# Patient Record
Sex: Female | Born: 1945 | Race: Black or African American | Hispanic: No | Marital: Married | Smoking: Never smoker
Health system: Southern US, Community
[De-identification: ages and names within clinical notes are randomized; demographics above are authoritative.]

## PROBLEM LIST (undated history)

## (undated) DIAGNOSIS — E079 Disorder of thyroid, unspecified: Secondary | ICD-10-CM

## (undated) DIAGNOSIS — E785 Hyperlipidemia, unspecified: Secondary | ICD-10-CM

## (undated) DIAGNOSIS — R739 Hyperglycemia, unspecified: Secondary | ICD-10-CM

## (undated) DIAGNOSIS — I10 Essential (primary) hypertension: Secondary | ICD-10-CM

## (undated) HISTORY — DX: Essential (primary) hypertension: I10

## (undated) HISTORY — DX: Hyperlipidemia, unspecified: E78.5

## (undated) HISTORY — PX: BREAST BIOPSY: SHX20

## (undated) HISTORY — PX: BIOPSY BREAST: PRO8

## (undated) HISTORY — DX: Hyperglycemia, unspecified: R73.9

## (undated) HISTORY — PX: ROTATOR CUFF REPAIR: SHX139

## (undated) HISTORY — PX: ABDOMINAL HYSTERECTOMY: SHX81

## (undated) HISTORY — DX: Disorder of thyroid, unspecified: E07.9

## (undated) HISTORY — PX: BACK SURGERY: SHX140

---

## 1971-06-22 HISTORY — PX: BREAST EXCISIONAL BIOPSY: SUR124

## 2000-03-30 ENCOUNTER — Encounter: Payer: Self-pay | Admitting: Orthopedic Surgery

## 2000-03-30 ENCOUNTER — Encounter: Admission: RE | Admit: 2000-03-30 | Discharge: 2000-03-30 | Payer: Self-pay | Admitting: Orthopedic Surgery

## 2000-04-05 ENCOUNTER — Encounter: Admission: RE | Admit: 2000-04-05 | Discharge: 2000-04-05 | Payer: Self-pay | Admitting: Orthopedic Surgery

## 2006-04-11 ENCOUNTER — Ambulatory Visit: Payer: Self-pay | Admitting: Internal Medicine

## 2006-04-11 LAB — CONVERTED CEMR LAB
ALT: 17 units/L (ref 0–40)
AST: 19 units/L (ref 0–37)
Albumin: 3.8 g/dL (ref 3.5–5.2)
Alkaline Phosphatase: 74 units/L (ref 39–117)
BUN: 9 mg/dL (ref 6–23)
CO2: 30 meq/L (ref 19–32)
Calcium: 9.2 mg/dL (ref 8.4–10.5)
Chloride: 104 meq/L (ref 96–112)
Chol/HDL Ratio, serum: 3.9
Cholesterol: 196 mg/dL (ref 0–200)
Creatinine, Ser: 0.8 mg/dL (ref 0.4–1.2)
GFR calc non Af Amer: 78 mL/min
Glomerular Filtration Rate, Af Am: 94 mL/min/{1.73_m2}
Glucose, Bld: 92 mg/dL (ref 70–99)
HCT: 39.1 % (ref 36.0–46.0)
HDL: 50.2 mg/dL (ref >39.0)
Hemoglobin: 12.7 g/dL (ref 12.0–15.0)
LDL Cholesterol: 130 mg/dL — ABNORMAL HIGH (ref 0–99)
MCHC: 32.6 g/dL (ref 30.0–36.0)
MCV: 87.6 fL (ref 78.0–100.0)
Platelets: 235 10*3/uL (ref 150–400)
Potassium: 4.1 meq/L (ref 3.5–5.1)
RBC: 4.46 M/uL (ref 3.87–5.11)
RDW: 12.9 % (ref 11.5–14.6)
Sed Rate: 18 mm/hr (ref 0–25)
Sodium: 141 meq/L (ref 135–145)
TSH: 2.19 microintl units/mL (ref 0.35–5.50)
Total Bilirubin: 0.9 mg/dL (ref 0.3–1.2)
Total Protein: 7.7 g/dL (ref 6.0–8.3)
Triglyceride fasting, serum: 77 mg/dL (ref 0–149)
VLDL: 15 mg/dL (ref 0–40)
WBC: 3.9 10*3/uL — ABNORMAL LOW (ref 4.5–10.5)

## 2006-04-12 ENCOUNTER — Encounter: Payer: Self-pay | Admitting: Internal Medicine

## 2006-07-14 ENCOUNTER — Encounter: Payer: Self-pay | Admitting: Internal Medicine

## 2006-11-23 ENCOUNTER — Encounter: Payer: Self-pay | Admitting: Internal Medicine

## 2007-03-10 ENCOUNTER — Ambulatory Visit: Payer: Self-pay | Admitting: Internal Medicine

## 2007-03-10 DIAGNOSIS — Z8679 Personal history of other diseases of the circulatory system: Secondary | ICD-10-CM | POA: Insufficient documentation

## 2007-03-10 DIAGNOSIS — R109 Unspecified abdominal pain: Secondary | ICD-10-CM | POA: Insufficient documentation

## 2007-03-10 DIAGNOSIS — Z8719 Personal history of other diseases of the digestive system: Secondary | ICD-10-CM | POA: Insufficient documentation

## 2007-03-10 DIAGNOSIS — R209 Unspecified disturbances of skin sensation: Secondary | ICD-10-CM

## 2007-03-10 LAB — CONVERTED CEMR LAB
Bilirubin Urine: NEGATIVE
Blood in Urine, dipstick: NEGATIVE
Glucose, Urine, Semiquant: NEGATIVE
Ketones, urine, test strip: NEGATIVE
Nitrite: NEGATIVE
Protein, U semiquant: NEGATIVE
Specific Gravity, Urine: 1.01
Urobilinogen, UA: NEGATIVE
WBC Urine, dipstick: NEGATIVE
pH: 6

## 2007-03-15 ENCOUNTER — Encounter: Payer: Self-pay | Admitting: Internal Medicine

## 2007-03-15 LAB — CONVERTED CEMR LAB
BUN: 12 mg/dL (ref 6–23)
Basophils Absolute: 0 10*3/uL (ref 0.0–0.1)
Basophils Relative: 1 % (ref 0–1)
CO2: 24 meq/L (ref 19–32)
Calcium: 11.1 mg/dL — ABNORMAL HIGH (ref 8.4–10.5)
Chloride: 104 meq/L (ref 96–112)
Creatinine, Ser: 0.82 mg/dL (ref 0.40–1.20)
Eosinophils Absolute: 0.1 10*3/uL (ref 0.0–0.7)
Eosinophils Relative: 2 % (ref 0–5)
Folate: >20 ng/mL
Glucose, Bld: 87 mg/dL (ref 70–99)
HCT: 36.6 % (ref 36.0–46.0)
Hemoglobin: 12.9 g/dL (ref 12.0–15.0)
Lymphocytes Relative: 42 % (ref 12–46)
Lymphs Abs: 1.7 10*3/uL (ref 0.7–3.3)
MCHC: 35.2 g/dL (ref 30.0–36.0)
MCV: 82.4 fL (ref 78.0–100.0)
Monocytes Absolute: 0.6 10*3/uL (ref 0.2–0.7)
Monocytes Relative: 14 % — ABNORMAL HIGH (ref 3–11)
Neutro Abs: 1.7 10*3/uL (ref 1.7–7.7)
Neutrophils Relative %: 41 % — ABNORMAL LOW (ref 43–77)
Platelets: 197 10*3/uL (ref 150–400)
Potassium: 4.4 meq/L (ref 3.5–5.3)
RBC: 4.44 M/uL (ref 3.87–5.11)
RDW: 14.3 % — ABNORMAL HIGH (ref 11.5–14.0)
Sodium: 141 meq/L (ref 135–145)
TSH: 2.286 microintl units/mL (ref 0.350–5.50)
Vitamin B-12: 396 pg/mL (ref 211–911)
WBC: 4 10*3/uL (ref 4.0–10.5)

## 2007-06-26 ENCOUNTER — Telehealth (INDEPENDENT_AMBULATORY_CARE_PROVIDER_SITE_OTHER): Payer: Self-pay | Admitting: *Deleted

## 2007-07-21 ENCOUNTER — Telehealth: Payer: Self-pay | Admitting: Internal Medicine

## 2008-09-23 ENCOUNTER — Encounter: Admission: RE | Admit: 2008-09-23 | Discharge: 2008-09-23 | Payer: Self-pay | Admitting: Sports Medicine

## 2008-10-10 ENCOUNTER — Ambulatory Visit (HOSPITAL_COMMUNITY): Admission: RE | Admit: 2008-10-10 | Discharge: 2008-10-11 | Payer: Self-pay | Admitting: Neurosurgery

## 2010-08-20 LAB — HM COLONOSCOPY

## 2010-09-30 LAB — CBC
HCT: 39.9 % (ref 36.0–46.0)
Hemoglobin: 13.9 g/dL (ref 12.0–15.0)
MCHC: 34.8 g/dL (ref 30.0–36.0)
RBC: 4.59 MIL/uL (ref 3.87–5.11)

## 2010-09-30 LAB — BASIC METABOLIC PANEL
CO2: 29 mEq/L (ref 19–32)
Calcium: 9 mg/dL (ref 8.4–10.5)
GFR calc Af Amer: >60 mL/min (ref >60)
Potassium: 4 mEq/L (ref 3.5–5.1)
Sodium: 141 mEq/L (ref 135–145)

## 2010-11-03 NOTE — Op Note (Signed)
Nicole Douglas, Nicole Douglas                 ACCOUNT NO.:  [id number]   MEDICAL RECORD NO.:  [id number]          PATIENT TYPE:  OIB   LOCATION:  3528                         FACILITY:  [facility]   PHYSICIAN:  Cristi Loron, M.D.DATE OF BIRTH:  [birth year]   DATE OF PROCEDURE:  10/10/2008  DATE OF DISCHARGE:                               OPERATIVE REPORT   BRIEF HISTORY:  The patient is a 65 year old back female who suffered  back and right leg pain consistent with right L4 and L5 radiculopathy.  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  I discussed various  treatment options with the patient and recommended to consider a right  L4 hemilaminectomy to decompress her right L4 as well as L5 nerve root.  The patient weighed the risks, benefits, and alternatives of the  operation and decided to proceed with the operation.   PREOPERATIVE DIAGNOSES:  Right L4-5 herniated nucleus pulposus, spinal  stenosis, right L4 and L5 radiculopathy, lumbago.   POSTOPERATIVE DIAGNOSES:  Right L4-5 herniated nucleus pulposus, spinal  stenosis, right L4 and L5 radiculopathy, lumbago.   PROCEDURE:  Right L4 hemilaminectomy with right L3 laminotomy and right  L4-5 diskectomy to decompress the right L4 and L5 nerve root using  microdissection.   SURGEON:  Cristi Loron, MD   ASSISTANT:  Stefani Dama, MD   ANESTHESIA:  General endotracheal.   ESTIMATED BLOOD LOSS:  50 mL.   SPECIMENS:  None.   DRAINS:  None.   COMPLICATIONS:  None.   DESCRIPTION OF PROCEDURE:  The patient was brought to the operating by  Anesthesia team.  General endotracheal anesthesia was induced.  The  patient was turned to prone position on Wilson frame.  The lumbosacral  region was then prepared with Betadine  scrub with Betadine solution.  Sterile drapes were applied.  I then injected the area to be incised  with Marcaine with epinephrine solution.  I used a scalpel to make a  linear midline incision over the L4-5 interspace.  I used electrocautery  to perform a right-sided subperiosteal dissection exposing the spinous  process and lamina of L4 and L5.  We obtained intraoperative radiograph  to confirm our location and then inserted McCullough retractor for  exposure.   We then used high-speed drill to perform a right L4 and L3 laminotomy.  I then used a Kerrison punch to complete the right L4 hemilaminectomy to  remove the ligament flavum at L4-5 and L3-4 and to widen the L3  laminotomy.  This gave [place] a long exposure of the thecal sac as well as  exposure of both the right L4 and L5 nerve root.   We then brought the operative microscope into field and under its  magnification and illumination, we completed the  microdissection/decompression.  We freed up the thecal sac and nerve  roots using microdissection.  Dr. Danielle Dess  then gently retracted the  thecal sac medially and we encountered a free fragment of disk  herniation which had migrated from the L4-5 intervertebral disk in a  cephalad direction and was compressing more or less in the axilla of the  right L4 nerve root.  We removed the disk herniation in multiple  fragments using the pituitary forceps.  We then performed foraminotomies  about the right L4 and L5 nerve root.  We then palpated along the  ventral surface of the thecal sac along the exit route of the right L4  and L5 nerve roots and noted that they were well decompressed.  Of note,  the patient did have a pseudo disk herniation at L4-5 because of  spondylolisthesis, but there was no true disk herniation per se at the  disk space.  We therefore did not enter into the intervertebral disk.  We obtained hemostasis using bipolar cautery.  We irrigated the wound  out with  bacitracin solution, removed the retractors, and then  reapproximated the patient's thoracolumbar fascia with interrupted #1  Vicryl suture, subcutaneous tissue with interrupted 2-0 Vicryl suture,  and the skin with Steri-Strips and Benzoin.  The wound was then coated  with bacitracin ointment.  A sterile dressing applied.  The drapes were  removed.  The patient was subsequently returned to supine position where  she was extubated by the Anesthesia team and transported to the Post  Anesthesia Care Unit in stable condition.  All sponge, instrument, and  needle counts were correct at the end of this case.      Cristi Loron, M.D.  Electronically Signed     JDJ/MEDQ  D:  10/10/2008  T:  10/11/2008  Job:  [id number]

## 2013-01-19 LAB — HM PAP SMEAR

## 2013-02-19 LAB — HM MAMMOGRAPHY

## 2013-09-13 ENCOUNTER — Ambulatory Visit: Payer: Self-pay | Admitting: Internal Medicine

## 2013-09-13 ENCOUNTER — Ambulatory Visit (HOSPITAL_BASED_OUTPATIENT_CLINIC_OR_DEPARTMENT_OTHER)
Admission: RE | Admit: 2013-09-13 | Discharge: 2013-09-13 | Disposition: A | Discharge status: Home / Self Care | Payer: Medicare Other | Source: Ambulatory Visit | Attending: Nurse Practitioner | Admitting: Nurse Practitioner

## 2013-09-13 ENCOUNTER — Telehealth: Payer: Self-pay | Admitting: Nurse Practitioner

## 2013-09-13 ENCOUNTER — Ambulatory Visit (INDEPENDENT_AMBULATORY_CARE_PROVIDER_SITE_OTHER): Payer: Medicare Other | Admitting: Nurse Practitioner

## 2013-09-13 VITALS — BP 128/73 | HR 58 | Temp 98.0°F | Ht 66.0 in | Wt 179.0 lb

## 2013-09-13 DIAGNOSIS — M545 Low back pain, unspecified: Secondary | ICD-10-CM | POA: Insufficient documentation

## 2013-09-13 DIAGNOSIS — S39012A Strain of muscle, fascia and tendon of lower back, initial encounter: Secondary | ICD-10-CM

## 2013-09-13 DIAGNOSIS — M25559 Pain in unspecified hip: Secondary | ICD-10-CM | POA: Insufficient documentation

## 2013-09-13 DIAGNOSIS — S335XXA Sprain of ligaments of lumbar spine, initial encounter: Secondary | ICD-10-CM

## 2013-09-13 DIAGNOSIS — M47817 Spondylosis without myelopathy or radiculopathy, lumbosacral region: Secondary | ICD-10-CM | POA: Insufficient documentation

## 2013-09-13 MED ORDER — PREDNISONE 10 MG PO TABS: ORAL_TABLET | ORAL | Status: DC

## 2013-09-13 NOTE — Patient Instructions (Signed)
Use heat, aspercream on painful areas. Take prednisone. Get xrays. Do gentle stretches daily. Follow up in 1 week. If pain not improved, I will refer you to physical therapy or orthopedist.    Lumbosacral Strain Lumbosacral strain is a strain of any of the parts that make up your lumbosacral vertebrae. Your lumbosacral vertebrae are the bones that make up the lower third of your backbone. Your lumbosacral vertebrae are held together by muscles and tough, fibrous tissue (ligaments).  CAUSES  A sudden blow to your back can cause lumbosacral strain. Also, anything that causes an excessive stretch of the muscles in the low back can cause this strain. This is typically seen when people exert themselves strenuously, fall, lift heavy objects, bend, or crouch repeatedly. RISK FACTORS  Physically demanding work.  Participation in pushing or pulling sports or sports that require sudden twist of the back (tennis, golf, baseball).  Weight lifting.  Excessive lower back curvature.  Forward-tilted pelvis.  Weak back or abdominal muscles or both.  Tight hamstrings. SIGNS AND SYMPTOMS  Lumbosacral strain may cause pain in the area of your injury or pain that moves (radiates) down your leg.  DIAGNOSIS Your health care provider can often diagnose lumbosacral strain through a physical exam. In some cases, you may need tests such as X-ray exams.  TREATMENT  Treatment for your lower back injury depends on many factors that your clinician will have to evaluate. However, most treatment will include the use of anti-inflammatory medicines. HOME CARE INSTRUCTIONS   Avoid hard physical activities (tennis, racquetball, waterskiing) if you are not in proper physical condition for it. This may aggravate or create problems.  If you have a back problem, avoid sports requiring sudden body movements. Swimming and walking are generally safer activities.  Maintain good posture.  Maintain a healthy weight.  For  acute conditions, you may put ice on the injured area.  Put ice in a plastic bag.  Place a towel between your skin and the bag.  Leave the ice on for 20 minutes, 2 3 times a day.  When the low back starts healing, stretching and strengthening exercises may be recommended. SEEK MEDICAL CARE IF:  Your back pain is getting worse.  You experience severe back pain not relieved with medicines. SEEK IMMEDIATE MEDICAL CARE IF:   You have numbness, tingling, weakness, or problems with the use of your arms or legs.  There is a change in bowel or bladder control.  You have increasing pain in any area of the body, including your belly (abdomen).  You notice shortness of breath, dizziness, or feel faint.  You feel sick to your stomach (nauseous), are throwing up (vomiting), or become sweaty.  You notice discoloration of your toes or legs, or your feet get very cold. MAKE SURE YOU:   Understand these instructions.  Will watch your condition.  Will get help right away if you are not doing well or get worse. Document Released: 03/17/2005 Document Revised: 03/28/2013 Document Reviewed: 01/24/2013 [facility] Patient Information 2014 [employer].

## 2013-09-13 NOTE — Progress Notes (Signed)
Pre-visit discussion using our clinic review tool. No additional management support is needed unless otherwise documented below in the visit note.  

## 2013-09-13 NOTE — Telephone Encounter (Signed)
Xrays show Moderate lumbar spondylosis with postoperative changes at L4-L5.  Progressive grade I anterolisthesis of L4 on L5 is probably facet mediated. Will recmd physical therapy.

## 2013-09-14 ENCOUNTER — Telehealth: Payer: Self-pay | Admitting: *Deleted

## 2013-09-14 DIAGNOSIS — M4310 Spondylolisthesis, site unspecified: Secondary | ICD-10-CM

## 2013-09-14 NOTE — Telephone Encounter (Signed)
Pt called back to let us know that she wants to try physical therapy for her hip. Pt wants to do water aerobics. Please call [facility] [phone]) to give a verbal order.

## 2013-09-14 NOTE — Telephone Encounter (Signed)
Patient notified of results. Pt stated that she wants to think about what she wants to do and discuss  It at her next appt.

## 2013-09-15 DIAGNOSIS — S39012A Strain of muscle, fascia and tendon of lower back, initial encounter: Secondary | ICD-10-CM | POA: Insufficient documentation

## 2013-09-15 NOTE — Progress Notes (Signed)
Subjective:    Lillia PaulsMarie K Biby is a 68 y.o. female who presents for evaluation of low back pain. The patient has had recurrent self limited episodes of low back pain in the past and previous spinal surgery - Dr Lovell SheehanJenkins, 2010-no record to review. L-S MRI 2010 showed right lateral recess and foraminal disc protrusion at L4-5 possibly impacting both the exiting right L4 and descending right L5 roots; Facet degenerative disease worst at L4-5 and L5-S1.   Symptoms have been present for 1 month and are rapidly worsening in the last week.  Onset was related to / precipitated by no known injury. The pain is located in the right lumbar area and radiates to the R SI joint & R iliac crest. The pain is described as burning and occurs all day. Pain is moderate to severe. Symptoms are exacerbated by sitting, twisting and walking/weight bearing. Symptoms are improved by heat. She has also tried acetaminophen and aspirin which provided no symptom relief. She has no other symptoms associated with the back pain. History indicative of potentially complicated back pain includes prior back surgery, known facet disease.  The following portions of the patient's history were reviewed and updated as appropriate: allergies, current medications, past medical history, past surgical history and problem list.  Review of Systems Pertinent items are noted in HPI.    Objective:   Normal reflexes, gait, strength and negative straight-leg raise. Inspection and palpation: inspection of back is normal, midline lumbar scar. tender to palpation at R SI joint & across R latissimus dorsi. Full forward flexion of back, but painful. FROM R hip. No tenderness over R trocanteric bursa.    Assessment & Plan:   Lumbar strain DD: radicular pathology See problem list for complete A&P

## 2013-09-15 NOTE — Assessment & Plan Note (Signed)
Burning pain R low back that radiates across R SI joint & iliac crest. Hx back surgery. DD: muscle strain, radicular pathology. XRY lumbar spine & pelvis Prednisone taper. Gentle stretches, moist heat. Offered PT referral, ortho referral. Pt declined. Wants to wait for xry results & see if prednisone helps.

## 2013-09-17 NOTE — Telephone Encounter (Signed)
Discussed patho of grade I anterolisthesis w/pt. & recommended thearpy. Pt is willing to go to PT, she also wants to do water aerobics. I advised she should see PT for their specific therapy, but I will also request water therapy. If no improvement, will refer to [facility] specialist of [place].

## 2013-09-18 NOTE — Telephone Encounter (Signed)
Spoke with pt, advised message from [place]. Pt understood. She spoke with her insurance and they stated she would have to stay in network. The providers they gave her were [facility] at [phone]  and Alpha Gulamy Wang [phone]

## 2013-09-19 ENCOUNTER — Telehealth: Payer: Self-pay

## 2013-09-19 NOTE — Telephone Encounter (Signed)
No answer. Unable to leave voice message.  Will call back.  NEW PATIENT

## 2013-09-19 NOTE — Telephone Encounter (Signed)
Medication and allergies:  Reviewed and updated  90 day supply/mail order: n/a [facility] pharmacy[facility]  [id number] [place], [place] - [address] [facility] [facility]   Immunizations due:  Td-DUE, PNA- DUE, Zoster- DUE   A/P: Personal, family history & past surgical hx:  Reviewed and updated PAP- 01/2013 per patient-normal CCS- 08/2010 or 2013 per patient MMG- 02/2013 per patient Flu- declined Tdap- "unsure"; probably greater than 10 years per patient PNA- Never had one per patient  Shingles- Never had one per patient  To Discuss with Provider: Nothing at this time.  Patient does not want a physical.

## 2013-09-20 ENCOUNTER — Encounter: Payer: Self-pay | Admitting: General Practice

## 2013-09-20 ENCOUNTER — Ambulatory Visit (INDEPENDENT_AMBULATORY_CARE_PROVIDER_SITE_OTHER): Payer: Medicare Other | Admitting: Family Medicine

## 2013-09-20 ENCOUNTER — Encounter: Payer: Self-pay | Admitting: Family Medicine

## 2013-09-20 VITALS — BP 130/80 | HR 60 | Temp 98.4°F | Resp 16 | Ht 66.0 in | Wt 177.5 lb

## 2013-09-20 DIAGNOSIS — M5137 Other intervertebral disc degeneration, lumbosacral region: Secondary | ICD-10-CM

## 2013-09-20 DIAGNOSIS — M5136 Other intervertebral disc degeneration, lumbar region: Secondary | ICD-10-CM | POA: Insufficient documentation

## 2013-09-20 DIAGNOSIS — E785 Hyperlipidemia, unspecified: Secondary | ICD-10-CM

## 2013-09-20 DIAGNOSIS — Z23 Encounter for immunization: Secondary | ICD-10-CM

## 2013-09-20 DIAGNOSIS — I10 Essential (primary) hypertension: Secondary | ICD-10-CM | POA: Insufficient documentation

## 2013-09-20 LAB — CBC WITH DIFFERENTIAL/PLATELET
Basophils Absolute: 0 10*3/uL (ref 0.0–0.1)
Basophils Relative: 0.3 % (ref 0.0–3.0)
EOS ABS: 0.1 10*3/uL (ref 0.0–0.7)
EOS PCT: 1.6 % (ref 0.0–5.0)
HEMATOCRIT: 39.2 % (ref 36.0–46.0)
Hemoglobin: 13.1 g/dL (ref 12.0–15.0)
LYMPHS ABS: 3 10*3/uL (ref 0.7–4.0)
Lymphocytes Relative: 36.8 % (ref 12.0–46.0)
MCHC: 33.4 g/dL (ref 30.0–36.0)
MCV: 87.4 fl (ref 78.0–100.0)
MONO ABS: 0.6 10*3/uL (ref 0.1–1.0)
Monocytes Relative: 7.8 % (ref 3.0–12.0)
NEUTROS PCT: 53.5 % (ref 43.0–77.0)
Neutro Abs: 4.4 10*3/uL (ref 1.4–7.7)
PLATELETS: 184 10*3/uL (ref 150.0–400.0)
RBC: 4.48 Mil/uL (ref 3.87–5.11)
RDW: 15.1 % — AB (ref 11.5–14.6)
WBC: 8.3 10*3/uL (ref 4.5–10.5)

## 2013-09-20 LAB — BASIC METABOLIC PANEL
BUN: 17 mg/dL (ref 6–23)
CHLORIDE: 101 meq/L (ref 96–112)
CO2: 28 mEq/L (ref 19–32)
Calcium: 8.9 mg/dL (ref 8.4–10.5)
Creatinine, Ser: 0.8 mg/dL (ref 0.4–1.2)
GFR: 86.41 mL/min (ref >60.00)
Glucose, Bld: 101 mg/dL — ABNORMAL HIGH (ref 70–99)
POTASSIUM: 3.7 meq/L (ref 3.5–5.1)
SODIUM: 139 meq/L (ref 135–145)

## 2013-09-20 LAB — HEPATIC FUNCTION PANEL
ALK PHOS: 86 U/L (ref 39–117)
ALT: 23 U/L (ref 0–35)
AST: 17 U/L (ref 0–37)
Albumin: 4 g/dL (ref 3.5–5.2)
BILIRUBIN DIRECT: 0.1 mg/dL (ref 0.0–0.3)
BILIRUBIN TOTAL: 1.4 mg/dL — AB (ref 0.3–1.2)
Total Protein: 7.3 g/dL (ref 6.0–8.3)

## 2013-09-20 LAB — LIPID PANEL
CHOLESTEROL: 150 mg/dL (ref 0–200)
HDL: 68.2 mg/dL (ref >39.00)
LDL CALC: 67 mg/dL (ref 0–99)
Total CHOL/HDL Ratio: 2
Triglycerides: 74 mg/dL (ref 0.0–149.0)
VLDL: 14.8 mg/dL (ref 0.0–40.0)

## 2013-09-20 NOTE — Assessment & Plan Note (Signed)
New to provider, chronic for pt.  Currently on pred taper.  Scheduled to start PT.  Will continue to monitor.

## 2013-09-20 NOTE — Assessment & Plan Note (Signed)
New to provider, ongoing for pt.  Well controlled.  Asymptomatic.  Check labs.  Anticipatory guidance provided.

## 2013-09-20 NOTE — Patient Instructions (Signed)
Schedule your complete physical in 6 months We'll notify you of your lab results and make any changes if needed Keep up the good work!  You look great! Call with any questions or concerns Welcome Back!  We're glad to have you!

## 2013-09-20 NOTE — Progress Notes (Signed)
   Subjective:    Patient ID: Nicole PaulsMarie K Douglas, female    DOB: 1946-02-28, 68 y.o.   MRN: [id number]  HPI New to establish.  [facility]  HTN- chronic problem, 1st dx'd 8-10 yrs ago.  On Losartan and Metoprolol.  No CP, SOB, HAs, visual changes, edema.  Hyperlipidemia- chronic problem, on Lipitor.  No abd pain, N/V, myalgias.  Lumbar degenerative changes- saw Maximino SarinLayne Weaver last week for this, currently on pred taper.  Has PT identified but schedule has not yet worked to start treatment.  Hx of back surgery w/ Dr Lovell SheehanJenkins (2010)  Review of Systems For ROS see HPI     Objective:   Physical Exam  Vitals reviewed. Constitutional: She is oriented to person, place, and time. She appears well-developed and well-nourished. No distress.  HENT:  Head: Normocephalic and atraumatic.  Eyes: Conjunctivae and EOM are normal. Pupils are equal, round, and reactive to light.  Neck: Normal range of motion. Neck supple. No thyromegaly present.  Cardiovascular: Normal rate, regular rhythm, normal heart sounds and intact distal pulses.   No murmur heard. Pulmonary/Chest: Effort normal and breath sounds normal. No respiratory distress.  Abdominal: Soft. She exhibits no distension. There is no tenderness.  Musculoskeletal: She exhibits no edema.  Lymphadenopathy:    She has no cervical adenopathy.  Neurological: She is alert and oriented to person, place, and time.  Skin: Skin is warm and dry.  Psychiatric: She has a normal mood and affect. Her behavior is normal.          Assessment & Plan:

## 2013-09-20 NOTE — Progress Notes (Signed)
Pre visit review using our clinic review tool, if applicable. No additional management support is needed unless otherwise documented below in the visit note. 

## 2013-09-20 NOTE — Assessment & Plan Note (Signed)
New to provider, ongoing for pt.  Tolerating med w/o difficulty.  Check labs.  Adjust meds prn

## 2013-09-20 NOTE — Addendum Note (Signed)
Addended by: Jackson LatinoYLER, JESSICA L on: 09/20/2013 10:49 AM   Modules accepted: Orders

## 2013-09-24 ENCOUNTER — Telehealth: Payer: Self-pay | Admitting: Family Medicine

## 2013-09-24 NOTE — Telephone Encounter (Signed)
Relevant patient education assigned to patient using Emmi. ° °

## 2013-10-17 ENCOUNTER — Telehealth: Payer: Self-pay

## 2013-10-17 MED ORDER — MECLIZINE HCL 25 MG PO TABS: 25.0000 mg | ORAL_TABLET | Freq: Three times a day (TID) | ORAL | Status: DC | PRN

## 2013-10-17 NOTE — Telephone Encounter (Signed)
C/o dizziness x 3 days.  Starts in the morning and persists throughout the day.  Described as a "nervous" dizziness.  Feels nervous on the inside.  Monday was the worst; patient stated that she was unable to lift her head from her pillow. Hx. Vertigo.  Also having elevated blood pressures and intermittent "lighting" sharp pain in chest, that lasts for second and then disappears.  [facility]  Vitals Upon arrival: 168/88 sitting, HR: 58, 98% on RA Laying down:  190/94 (Became dizzy, room spinning) Sitting:  188/110 (became emotional and started crying; dizziness resolved) Standing:  192/108    Dr. Beverely Lowabori was made aware.  Ordered:  Meclizine 25 mg PO TID PRN, Increase Losartan to 100 mg daily, and schedule office visit.    Orders were reviewed with patient and her husband.  Office visit scheduled for Friday, Oct 19, 2013 at 0945.   Patient and husband was advised that if she starts to experience chest pain, pounding headache, or worsening symptoms to go immediately to the ER or to call EMS.  They stated understanding and agreed with plan.

## 2013-10-19 ENCOUNTER — Encounter: Payer: Self-pay | Admitting: Family Medicine

## 2013-10-19 ENCOUNTER — Ambulatory Visit: Payer: Medicare Other

## 2013-10-19 ENCOUNTER — Ambulatory Visit (INDEPENDENT_AMBULATORY_CARE_PROVIDER_SITE_OTHER): Payer: Medicare Other | Admitting: Family Medicine

## 2013-10-19 VITALS — BP 160/68 | HR 78 | Temp 98.2°F | Resp 16 | Wt 179.1 lb

## 2013-10-19 DIAGNOSIS — R7309 Other abnormal glucose: Secondary | ICD-10-CM

## 2013-10-19 DIAGNOSIS — H8110 Benign paroxysmal vertigo, unspecified ear: Secondary | ICD-10-CM

## 2013-10-19 DIAGNOSIS — I10 Essential (primary) hypertension: Secondary | ICD-10-CM

## 2013-10-19 DIAGNOSIS — R42 Dizziness and giddiness: Secondary | ICD-10-CM | POA: Insufficient documentation

## 2013-10-19 LAB — BASIC METABOLIC PANEL
BUN: 12 mg/dL (ref 6–23)
CALCIUM: 9.2 mg/dL (ref 8.4–10.5)
CO2: 26 mEq/L (ref 19–32)
Chloride: 107 mEq/L (ref 96–112)
Creatinine, Ser: 0.8 mg/dL (ref 0.4–1.2)
GFR: 88.83 mL/min (ref >60.00)
GLUCOSE: 130 mg/dL — AB (ref 70–99)
POTASSIUM: 3.7 meq/L (ref 3.5–5.1)
SODIUM: 141 meq/L (ref 135–145)

## 2013-10-19 LAB — CBC WITH DIFFERENTIAL/PLATELET
BASOS ABS: 0 10*3/uL (ref 0.0–0.1)
BASOS PCT: 0.5 % (ref 0.0–3.0)
EOS ABS: 0.1 10*3/uL (ref 0.0–0.7)
Eosinophils Relative: 2 % (ref 0.0–5.0)
HCT: 38.2 % (ref 36.0–46.0)
HEMOGLOBIN: 12.9 g/dL (ref 12.0–15.0)
LYMPHS PCT: 29.8 % (ref 12.0–46.0)
Lymphs Abs: 1.5 10*3/uL (ref 0.7–4.0)
MCHC: 33.8 g/dL (ref 30.0–36.0)
MCV: 87 fl (ref 78.0–100.0)
MONOS PCT: 9.2 % (ref 3.0–12.0)
Monocytes Absolute: 0.5 10*3/uL (ref 0.1–1.0)
NEUTROS PCT: 58.5 % (ref 43.0–77.0)
Neutro Abs: 2.9 10*3/uL (ref 1.4–7.7)
Platelets: 218 10*3/uL (ref 150.0–400.0)
RBC: 4.39 Mil/uL (ref 3.87–5.11)
RDW: 14.6 % (ref 11.5–14.6)
WBC: 5 10*3/uL (ref 4.5–10.5)

## 2013-10-19 LAB — HEMOGLOBIN A1C: Hgb A1c MFr Bld: 5.6 % (ref 4.6–6.5)

## 2013-10-19 LAB — TSH: TSH: 1.65 u[IU]/mL (ref 0.35–5.50)

## 2013-10-19 MED ORDER — FLUTICASONE PROPIONATE 50 MCG/ACT NA SUSP: 2.0000 | Freq: Every day | NASAL | Status: DC

## 2013-10-19 MED ORDER — ONDANSETRON HCL 4 MG PO TABS: 4.0000 mg | ORAL_TABLET | Freq: Three times a day (TID) | ORAL | Status: DC | PRN

## 2013-10-19 NOTE — Patient Instructions (Signed)
Follow up in 1 month to recheck BP Continue the higher dose of Losartan (100mg - 2 tabs daily) Use the Meclizine as needed for dizziness- you can take 2 tabs at a time if dizziness is severe Take the Zofran as needed for nausea Drink plenty of fluids Change positions slowly Use the Flonase- 2 sprays each nostril- to decrease nasal congestion Call with any questions or concerns- particularly if dizziness persists, worsens, or you develop other symptoms Hang in there!

## 2013-10-19 NOTE — Progress Notes (Signed)
   Subjective:    Patient ID: Nicole Douglas, female    DOB: 09-May-1946, 68 y.o.   MRN: [id number]  HPI Pt walked in on 4/29 for elevated BP and dizziness.  Pt has hx of vertigo and HTN.  Was given meclizine and increased Losartan to 100mg  daily. Pt reports this is 2nd episode in 2 months.  On Monday, 'i just couldn't get my head off the pillow'- every time she tried to sit up, the room would spin.  Has been having vertigo intermittently since.  Worse w/ turning head, walking, rolling over- position changes.  No ear pain.  No nasal congestion or sinus pressure.  No focal weakness.  No confusion.  No fevers.  Meclizine is providing some relief.   Review of Systems For ROS see HPI     Objective:   Physical Exam  Vitals reviewed. Constitutional: She is oriented to person, place, and time. She appears well-developed and well-nourished. No distress.  HENT:  Head: Normocephalic and atraumatic.  Mouth/Throat: Uvula is midline and mucous membranes are normal.  TMs WNL No TTP over sinuses Minimal nasal congestion  Eyes: Conjunctivae and EOM are normal. Pupils are equal, round, and reactive to light.  2-3 beats of horizontal nystagmus  Neck: Normal range of motion. Neck supple.  Cardiovascular: Normal rate, regular rhythm and intact distal pulses.   Murmur (II/VI SEM) heard. Pulmonary/Chest: Effort normal and breath sounds normal. No respiratory distress. She has no wheezes. She has no rales.  Musculoskeletal: She exhibits no edema.  Lymphadenopathy:    She has no cervical adenopathy.  Neurological: She is alert and oriented to person, place, and time. She has normal reflexes. No cranial nerve deficit.  Skin: Skin is warm and dry.  Psychiatric: She has a normal mood and affect. Her behavior is normal. Judgment and thought content normal.          Assessment & Plan:

## 2013-10-19 NOTE — Progress Notes (Signed)
Pre visit review using our clinic review tool, if applicable. No additional management support is needed unless otherwise documented below in the visit note. 

## 2013-10-19 NOTE — Assessment & Plan Note (Signed)
Deteriorated.  Pt to continue Losartan at 100mg  daily.  Will get labs to assess BMP.  Will follow closely.

## 2013-10-19 NOTE — Assessment & Plan Note (Addendum)
New to provider.  Improving w/ meclizine.  Suspect that pt's dizziness, discomfort, and anxiety is elevating her BP.  No red flags on hx or PE.  Check labs to r/o underlying causes. Continue meclizine.  Reviewed supportive care and red flags that should prompt return.  Pt expressed understanding and is in agreement w/ plan.

## 2013-10-25 ENCOUNTER — Telehealth: Payer: Self-pay | Admitting: Family Medicine

## 2013-10-25 NOTE — Telephone Encounter (Addendum)
C/o:  Right hand swollen and hurts when she uses it.  Rate pain 6/10.  Describes pain as achy, sharp pain with movement.  Having a hard time gripping items with hand.  More swelling noted in thumb and palm.    Onset:  Swelling started yesterday and has worsen today.  However, right hand has been sore for a while "approximately month," but only recently started to swell.  Denies recent injury and hx of arthritis.    Advice given:  Appointment scheduled for 10/26/13 at 11:30am.  She was also advised to soak hand in warm water 10-15 minutes periodically and to take Aleve as directed on bottle.  Report to Urgent Care or ER if pain becomes severe or new symptoms appear.

## 2013-10-25 NOTE — Telephone Encounter (Signed)
Caller name: Hilda LiasMarie Relation to pt: self Call back [phone]:   Reason for call:   Pt states that right hand is swollen and wants to know if this is a reaction to the current medications she is taking.  Please advise.

## 2013-10-26 ENCOUNTER — Ambulatory Visit (INDEPENDENT_AMBULATORY_CARE_PROVIDER_SITE_OTHER): Payer: Medicare Other | Admitting: Family Medicine

## 2013-10-26 ENCOUNTER — Ambulatory Visit (HOSPITAL_BASED_OUTPATIENT_CLINIC_OR_DEPARTMENT_OTHER)
Admission: RE | Admit: 2013-10-26 | Discharge: 2013-10-26 | Disposition: A | Discharge status: Home / Self Care | Payer: Medicare Other | Source: Ambulatory Visit | Attending: Family Medicine | Admitting: Family Medicine

## 2013-10-26 ENCOUNTER — Encounter: Payer: Self-pay | Admitting: Family Medicine

## 2013-10-26 VITALS — BP 140/80 | HR 67 | Temp 98.4°F | Resp 16 | Wt 180.1 lb

## 2013-10-26 DIAGNOSIS — M79641 Pain in right hand: Secondary | ICD-10-CM

## 2013-10-26 DIAGNOSIS — M79609 Pain in unspecified limb: Secondary | ICD-10-CM

## 2013-10-26 DIAGNOSIS — M25549 Pain in joints of unspecified hand: Secondary | ICD-10-CM | POA: Insufficient documentation

## 2013-10-26 DIAGNOSIS — R42 Dizziness and giddiness: Secondary | ICD-10-CM

## 2013-10-26 LAB — CBC WITH DIFFERENTIAL/PLATELET
BASOS PCT: 0.3 % (ref 0.0–3.0)
Basophils Absolute: 0 10*3/uL (ref 0.0–0.1)
EOS PCT: 1.9 % (ref 0.0–5.0)
Eosinophils Absolute: 0.1 10*3/uL (ref 0.0–0.7)
HCT: 36.3 % (ref 36.0–46.0)
HEMOGLOBIN: 12.3 g/dL (ref 12.0–15.0)
LYMPHS PCT: 30.4 % (ref 12.0–46.0)
Lymphs Abs: 1.3 10*3/uL (ref 0.7–4.0)
MCHC: 33.8 g/dL (ref 30.0–36.0)
MCV: 86.9 fl (ref 78.0–100.0)
Monocytes Absolute: 0.4 10*3/uL (ref 0.1–1.0)
Monocytes Relative: 9 % (ref 3.0–12.0)
NEUTROS ABS: 2.6 10*3/uL (ref 1.4–7.7)
NEUTROS PCT: 58.4 % (ref 43.0–77.0)
Platelets: 184 10*3/uL (ref 150.0–400.0)
RBC: 4.18 Mil/uL (ref 3.87–5.11)
RDW: 14.3 % (ref 11.5–15.5)
WBC: 4.4 10*3/uL (ref 4.0–10.5)

## 2013-10-26 LAB — URIC ACID: Uric Acid, Serum: 5 mg/dL (ref 2.4–7.0)

## 2013-10-26 MED ORDER — MELOXICAM 15 MG PO TABS: 15.0000 mg | ORAL_TABLET | Freq: Every day | ORAL | Status: DC

## 2013-10-26 NOTE — Patient Instructions (Signed)
Follow up as needed [facility] [address] and get your xrays Start the Meloxicam daily for swelling and inflammation We'll notify you of your lab results and make any changes if needed Wear the brace for support If no improvement in 10-14 days, please call and we'll send you to ortho We'll call you with your neuro appt for the dizziness Call with any questions or concerns Hang in there!! Happy Mother's Day!!!

## 2013-10-26 NOTE — Progress Notes (Signed)
Pre visit review using our clinic review tool, if applicable. No additional management support is needed unless otherwise documented below in the visit note. 

## 2013-10-26 NOTE — Assessment & Plan Note (Signed)
Ongoing issue for pt.  Some relief w/ Meclizine but still having sxs when lying down.  Continue Meclizine, refer to neuro.

## 2013-10-26 NOTE — Assessment & Plan Note (Signed)
New.  Pt w/ pain and swelling over R CMC and MTP joints.  No known injury.  Check labs to r/o infxn, gout.  Get Xrays to assess.  Pt placed in thumb spica.  Start scheduled NSAID.  If no improvement in 10-14 days will refer to hand specialist.  Pt expressed understanding and is in agreement w/ plan.

## 2013-10-26 NOTE — Progress Notes (Signed)
   Subjective:    Patient ID: Nicole Douglas, female    DOB: 07/28/1945, 68 y.o.   MRN: [id number]  HPI Hand pain- R hand.  Pain is concentrated around R 1st MCP and CMC joint.  + swelling.  Pain will radiate up into forearm.  Pain started on Wednesday.  No known injury.  Woke w/ pain and swelling.  Did epsom soaks w/ some improvement in swelling. R hand dominant.  Dizziness- continues to struggle when lying down.  Meclizine w/ some improvement but sxs persist.   Review of Systems For ROS see HPI     Objective:   Physical Exam  Vitals reviewed. Constitutional: She is oriented to person, place, and time. She appears well-developed and well-nourished. No distress.  HENT:  Head: Normocephalic and atraumatic.  Cardiovascular: Intact distal pulses.   Musculoskeletal: She exhibits edema (over R 1st CMC, MTP joints) and tenderness.  Neurological: She is alert and oriented to person, place, and time. She has normal reflexes. No cranial nerve deficit. Coordination normal.  R thumb strength WNL on manual muscle testing but pain w/ abduction in snuff box  Skin: Skin is warm and dry. No erythema.  Psychiatric: She has a normal mood and affect. Her behavior is normal. Thought content normal.          Assessment & Plan:

## 2013-10-29 ENCOUNTER — Telehealth: Payer: Self-pay | Admitting: Family Medicine

## 2013-10-29 NOTE — Telephone Encounter (Signed)
Neurology will call pt directly to schedule appt. I will document this info in the referral. Thanks.

## 2013-10-29 NOTE — Telephone Encounter (Signed)
Caller name:Elleana Zeis  Relation to BM:WUXLKGMpt:patient Call back number:[phone] Pharmacy:  Reason for call: patient called and stated that she will not be available on May 20 th or June 9 th -23 rd for her referral to  Neurology.

## 2013-10-31 ENCOUNTER — Encounter: Payer: Self-pay | Admitting: Neurology

## 2013-10-31 ENCOUNTER — Ambulatory Visit (INDEPENDENT_AMBULATORY_CARE_PROVIDER_SITE_OTHER): Payer: Medicare Other | Admitting: Neurology

## 2013-10-31 VITALS — BP 136/76 | HR 67 | Temp 97.5°F | Ht 66.0 in | Wt 179.0 lb

## 2013-10-31 DIAGNOSIS — R42 Dizziness and giddiness: Secondary | ICD-10-CM

## 2013-10-31 DIAGNOSIS — H9319 Tinnitus, unspecified ear: Secondary | ICD-10-CM

## 2013-10-31 NOTE — Progress Notes (Signed)
NEUROLOGY CONSULTATION NOTE  BRANDYE INTHAVONG MRN: [id number] DOB: May 24, 1946  Referring provider: Dr. Neena Rhymes Primary care provider: Dr. Neena Rhymes  Reason for consult:  dizziness  Dear Dr Beverely Low:  Thank you for your kind referral of Nicole Douglas for consultation of the above symptoms. Although her history is well known to you, please allow me to reiterate it for the purpose of our medical record. Records and images were personally reviewed where available.  HISTORY OF PRESENT ILLNESS: This is a pleasant 68 year old right-handed woman with a history of hypertension and hyperlipidemia, presenting for evaluation of dizziness.  She reports that a couple of years ago, she had an episode of severe spinning with associated nausea where she had to go to the ER. Symptoms resolved after a few days.  In the past 2 months, she has had 2 similar episodes of vertigo.  The most recent episode occurred at the end of April when she had episodes 8-10 times a day, lasting 45-60 minutes each.  Initially she would have the spinning sensation when walking or sitting up, bumping into things.  They then started to occur more when she was lying down and turning in the bed.  Lying still helps.  She took meclizine for 3 days, and has not taken it since last week, however continues to have intermittent mild episodes of "floating" that she reports is "pretty moderate but tolerable."  She denies any recent infections or head trauma.  She denies any associated headache, diplopia, dysarthria, dysphagia, focal numbness/tingling/weakness.  No nausea or vomiting with the past 2 more intense vertigo events.  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  She denies any clear hearing loss.  She had seen 2 ENT specialists 8 years ago for the tinnitus.  There is no family history of similar symptoms.  Laboratory Data: Lab Results  Component Value Date     WBC 4.4 10/26/2013   HGB 12.3 10/26/2013   HCT 36.3 10/26/2013   MCV 86.9 10/26/2013   PLT 184.0 10/26/2013     Chemistry      Component Value Date/Time   NA 141 10/19/2013 1017   K 3.7 10/19/2013 1017   CL 107 10/19/2013 1017   CO2 26 10/19/2013 1017   BUN 12 10/19/2013 1017   CREATININE 0.8 10/19/2013 1017      Component Value Date/Time   CALCIUM 9.2 10/19/2013 1017   ALKPHOS 86 09/20/2013 0933   AST 17 09/20/2013 0933   ALT 23 09/20/2013 0933   BILITOT 1.4* 09/20/2013 0933     Lab Results  Component Value Date   HGBA1C 5.6 10/19/2013   Lab Results  Component Value Date   TSH 1.65 10/19/2013    PAST MEDICAL HISTORY: Past Medical History  Diagnosis Date  . Hypertension   . Hyperlipemia     PAST SURGICAL HISTORY: Past Surgical History  Procedure Laterality Date  . Rotator cuff repair      bilateral  . Back surgery    . Abdominal hysterectomy    . Biopsy breast      left breast    MEDICATIONS: Current Outpatient Prescriptions on File Prior to Visit  Medication Sig Dispense Refill  . aspirin 81 MG tablet Take 81 mg by mouth daily.      Marland Kitchen atorvastatin (LIPITOR) 10 MG tablet Take 10 mg by mouth daily.      . Calcium Carbonate (  CALTRATE 600 PO) Take by mouth daily.      Marland Kitchen. ESTRADIOL PO Take by mouth daily.      . fluticasone (FLONASE) 50 MCG/ACT nasal spray Place 2 sprays into both nostrils daily.  16 g  6  . losartan (COZAAR) 50 MG tablet Take 100 mg by mouth daily. Take 2 tabs (100 mg total) by moth daily.      . meclizine (ANTIVERT) 25 MG tablet Take 1 tablet (25 mg total) by mouth 3 (three) times daily as needed for dizziness.  60 tablet  0  . meloxicam (MOBIC) 15 MG tablet Take 1 tablet (15 mg total) by mouth daily.  30 tablet  0  . metoprolol tartrate (LOPRESSOR) 25 MG tablet Take 25 mg by mouth 2 (two) times daily.        No current facility-administered medications on file prior to visit.    ALLERGIES: No Known Allergies  FAMILY HISTORY: Family History  Problem Relation Age  of Onset  . Hypertension      both sides of family  . Heart disease    . Cancer Brother     throat  . Heart attack Mother   . Heart attack Sister     SOCIAL HISTORY: History   Social History  . Marital Status: Married    Spouse Name: N/A    Number of Children: N/A  . Years of Education: N/A   Occupational History  . Not on file.   Social History Main Topics  . Smoking status: Never Smoker   . Smokeless tobacco: Never Used  . Alcohol Use: No  . Drug Use: No  . Sexual Activity: Not on file   Other Topics Concern  . Not on file   Social History Narrative  . No narrative on file    REVIEW OF SYSTEMS: Constitutional: No fevers, chills, or sweats, no generalized fatigue, change in appetite Eyes: No visual changes, double vision, eye pain Ear, nose and throat: No hearing loss, ear pain, nasal congestion, sore throat Cardiovascular: No chest pain, palpitations Respiratory:  No shortness of breath at rest or with exertion, wheezes GastrointestinaI: No nausea, vomiting, diarrhea, abdominal pain, fecal incontinence Genitourinary:  No dysuria, urinary retention or frequency Musculoskeletal:  No neck pain, back pain Integumentary: No rash, pruritus, skin lesions Neurological: as above Psychiatric: No depression, insomnia, anxiety Endocrine: No palpitations, fatigue, diaphoresis, mood swings, change in appetite, change in weight, increased thirst Hematologic/Lymphatic:  No anemia, purpura, petechiae. Allergic/Immunologic: no itchy/runny eyes, nasal congestion, recent allergic reactions, rashes  PHYSICAL EXAM: Filed Vitals:   10/31/13 0858  BP: 136/76  Pulse: 67  Temp: 97.5 F (36.4 C)   General: No acute distress Head:  Normocephalic/atraumatic Neck: supple, no paraspinal tenderness, full range of motion Back: No paraspinal tenderness Heart: regular rate and rhythm Lungs: Clear to auscultation bilaterally. Vascular: No carotid bruits. Skin/Extremities: No rash, no  edema Neurological Exam: Mental status: alert and oriented to person, place, and time, no dysarthria or aphasia, Fund of knowledge is appropriate.  Recent and remote memory are intact.  Attention and concentration are normal.    Able to name objects and repeat phrases. Cranial nerves: CN I: not tested CN II: pupils equal, round and reactive to light, visual fields intact, fundi unremarkable. CN III, IV, VI:  full range of motion, no nystagmus, no ptosis CN V: facial sensation intact CN VII: upper and lower face symmetric CN VIII: hearing intact CN IX, X: gag intact, uvula midline CN XI: sternocleidomastoid  and trapezius muscles intact CN XII: tongue midline Bulk & Tone: normal, no fasciculations. Motor: 5/5 throughout with no pronator drift. Sensation: intact to light touch, cold, pin, vibration and joint position sense.  No extinction to double simultaneous stimulation.  Romberg test negative Deep Tendon Reflexes: +2 throughout, no ankle clonus Plantar responses: downgoing bilaterally Cerebellar: no incoordination on finger to nose, heel to shin. No dysdiadochokinesia Gait: narrow-based and steady, able to tandem walk adequately. Tremor: none  IMPRESSION: This is a pleasant 68 year old right-handed woman with a history of hypertension and hyperlipidemia, presenting with episodes of vertigo suggestive of positional vertigo. She however continues to feel episodes of floating over the past week.  She also has constant tinnitus. Neurological exam is non-focal.  An MRI brain with and without contrast with cuts through the IACs will be ordered to assess for underlying structural abnormality, however I believe she would benefit most from vestibular therapy.  She can continue prn use of meclizine. She will follow-up in 3 months and knows to call our office for any problems.  Thank you for allowing me to participate in the care of this patient. Please do not hesitate to call for any questions or  concerns.   Patrcia DollyKaren Pasqual Farias, M.D.

## 2013-10-31 NOTE — Patient Instructions (Signed)
1. MRI brain with and without contrast with cuts through the IACs 2. Vestibular rehab

## 2013-11-02 ENCOUNTER — Ambulatory Visit
Admission: RE | Admit: 2013-11-02 | Discharge: 2013-11-02 | Disposition: A | Discharge status: Home / Self Care | Payer: Medicare Other | Source: Ambulatory Visit | Attending: Neurology | Admitting: Neurology

## 2013-11-02 DIAGNOSIS — R42 Dizziness and giddiness: Secondary | ICD-10-CM

## 2013-11-02 MED ORDER — GADOBENATE DIMEGLUMINE 529 MG/ML IV SOLN
17.0000 mL | Freq: Once | INTRAVENOUS | Status: AC | PRN
  Administered 2013-11-02: 17 mL via INTRAVENOUS

## 2013-11-07 ENCOUNTER — Telehealth: Payer: Self-pay | Admitting: Neurology

## 2013-11-07 ENCOUNTER — Other Ambulatory Visit: Payer: Medicare Other

## 2013-11-07 NOTE — Telephone Encounter (Signed)
Spoke with patient about MRI results and follow up appointment

## 2013-11-07 NOTE — Telephone Encounter (Signed)
Pt is returning call she think it maybe about MRI results please call [phone]

## 2013-11-14 ENCOUNTER — Ambulatory Visit (HOSPITAL_COMMUNITY): Payer: Medicare Other

## 2013-11-16 ENCOUNTER — Telehealth: Payer: Self-pay | Admitting: Family Medicine

## 2013-11-16 MED ORDER — ESTRADIOL 0.5 MG PO TABS: 0.5000 mg | ORAL_TABLET | Freq: Every day | ORAL | Status: DC

## 2013-11-16 NOTE — Telephone Encounter (Signed)
Med filled and pt notified.  

## 2013-11-16 NOTE — Telephone Encounter (Signed)
Caller name: Larey Brick  Relation to UP:JSRP Call back number:[phone] [facility]  Reason for call:   Pt wants a refill on RX ESTRADIOL PO .

## 2013-11-19 ENCOUNTER — Ambulatory Visit: Payer: Medicare Other | Admitting: Family Medicine

## 2013-11-21 ENCOUNTER — Telehealth: Payer: Self-pay | Admitting: *Deleted

## 2013-11-21 NOTE — Telephone Encounter (Signed)
Prior authorization paperwork for Estradiol faxed. Awaiting response. JG//CMA

## 2013-11-22 NOTE — Telephone Encounter (Signed)
Prior authorization for Estradiol approved through 11/22/2014. Reference #: [id number]. Approval letter sent for scanning. JG//CMA

## 2014-01-30 LAB — HM MAMMOGRAPHY

## 2014-02-04 ENCOUNTER — Encounter: Payer: Self-pay | Admitting: General Practice

## 2014-02-04 ENCOUNTER — Telehealth: Payer: Self-pay | Admitting: Neurology

## 2014-02-04 NOTE — Telephone Encounter (Signed)
Pt called to cancel her 02/05/14 f/u appt. She stated that her husband has an appt she needs to attend with him. Will call later to r/s. -AL

## 2014-02-05 ENCOUNTER — Ambulatory Visit: Payer: Medicare Other | Admitting: Neurology

## 2014-02-15 ENCOUNTER — Telehealth: Payer: Self-pay | Admitting: Family Medicine

## 2014-02-15 MED ORDER — ATORVASTATIN CALCIUM 10 MG PO TABS: 10.0000 mg | ORAL_TABLET | Freq: Every day | ORAL | Status: DC

## 2014-02-15 NOTE — Telephone Encounter (Signed)
Med filled.  

## 2014-02-15 NOTE — Telephone Encounter (Signed)
Caller name: Martena  Relation to pt: self  Call back number: [phone] [facility] [phone]   Reason for call: pt requesting refill atorvastatin (LIPITOR) 10 MG tablet

## 2014-02-18 ENCOUNTER — Other Ambulatory Visit: Payer: Self-pay | Admitting: General Practice

## 2014-02-18 MED ORDER — METOPROLOL TARTRATE 25 MG PO TABS: 25.0000 mg | ORAL_TABLET | Freq: Two times a day (BID) | ORAL | Status: DC

## 2014-03-22 ENCOUNTER — Encounter: Payer: Medicare Other | Admitting: Family Medicine

## 2014-03-27 ENCOUNTER — Encounter: Payer: Self-pay | Admitting: Family Medicine

## 2014-03-27 ENCOUNTER — Ambulatory Visit (INDEPENDENT_AMBULATORY_CARE_PROVIDER_SITE_OTHER): Payer: Medicare Other | Admitting: Family Medicine

## 2014-03-27 VITALS — BP 140/80 | HR 60 | Temp 97.9°F | Resp 16 | Ht 65.75 in | Wt 180.2 lb

## 2014-03-27 DIAGNOSIS — I10 Essential (primary) hypertension: Secondary | ICD-10-CM

## 2014-03-27 DIAGNOSIS — R102 Pelvic and perineal pain unspecified side: Secondary | ICD-10-CM | POA: Insufficient documentation

## 2014-03-27 DIAGNOSIS — N9489 Other specified conditions associated with female genital organs and menstrual cycle: Secondary | ICD-10-CM

## 2014-03-27 DIAGNOSIS — Z0000 Encounter for general adult medical examination without abnormal findings: Secondary | ICD-10-CM

## 2014-03-27 DIAGNOSIS — Z780 Asymptomatic menopausal state: Secondary | ICD-10-CM

## 2014-03-27 DIAGNOSIS — Z23 Encounter for immunization: Secondary | ICD-10-CM

## 2014-03-27 DIAGNOSIS — E785 Hyperlipidemia, unspecified: Secondary | ICD-10-CM

## 2014-03-27 DIAGNOSIS — K219 Gastro-esophageal reflux disease without esophagitis: Secondary | ICD-10-CM

## 2014-03-27 LAB — CBC WITH DIFFERENTIAL/PLATELET
Basophils Absolute: 0 10*3/uL (ref 0.0–0.1)
Basophils Relative: 0.7 % (ref 0.0–3.0)
Eosinophils Absolute: 0.2 10*3/uL (ref 0.0–0.7)
Eosinophils Relative: 2.8 % (ref 0.0–5.0)
HCT: 38.8 % (ref 36.0–46.0)
Hemoglobin: 12.9 g/dL (ref 12.0–15.0)
LYMPHS PCT: 29.1 % (ref 12.0–46.0)
Lymphs Abs: 1.6 10*3/uL (ref 0.7–4.0)
MCHC: 33.1 g/dL (ref 30.0–36.0)
MCV: 86.5 fl (ref 78.0–100.0)
Monocytes Absolute: 0.6 10*3/uL (ref 0.1–1.0)
Monocytes Relative: 10.5 % (ref 3.0–12.0)
NEUTROS ABS: 3.1 10*3/uL (ref 1.4–7.7)
Neutrophils Relative %: 56.9 % (ref 43.0–77.0)
Platelets: 173 10*3/uL (ref 150.0–400.0)
RBC: 4.49 Mil/uL (ref 3.87–5.11)
RDW: 15 % (ref 11.5–15.5)
WBC: 5.4 10*3/uL (ref 4.0–10.5)

## 2014-03-27 LAB — BASIC METABOLIC PANEL
BUN: 12 mg/dL (ref 6–23)
CALCIUM: 9 mg/dL (ref 8.4–10.5)
CO2: 25 mEq/L (ref 19–32)
Chloride: 107 mEq/L (ref 96–112)
Creatinine, Ser: 0.8 mg/dL (ref 0.4–1.2)
GFR: 91.28 mL/min (ref >60.00)
GLUCOSE: 101 mg/dL — AB (ref 70–99)
Potassium: 4.1 mEq/L (ref 3.5–5.1)
SODIUM: 140 meq/L (ref 135–145)

## 2014-03-27 LAB — LIPID PANEL
CHOLESTEROL: 143 mg/dL (ref 0–200)
HDL: 47.5 mg/dL (ref >39.00)
LDL CALC: 79 mg/dL (ref 0–99)
NonHDL: 95.5
Total CHOL/HDL Ratio: 3
Triglycerides: 82 mg/dL (ref 0.0–149.0)
VLDL: 16.4 mg/dL (ref 0.0–40.0)

## 2014-03-27 LAB — HEPATIC FUNCTION PANEL
ALT: 20 U/L (ref 0–35)
AST: 22 U/L (ref 0–37)
Albumin: 3.6 g/dL (ref 3.5–5.2)
Alkaline Phosphatase: 111 U/L (ref 39–117)
Bilirubin, Direct: 0.1 mg/dL (ref 0.0–0.3)
TOTAL PROTEIN: 7.8 g/dL (ref 6.0–8.3)
Total Bilirubin: 1.3 mg/dL — ABNORMAL HIGH (ref 0.2–1.2)

## 2014-03-27 LAB — TSH: TSH: 6.25 u[IU]/mL — ABNORMAL HIGH (ref 0.35–4.50)

## 2014-03-27 LAB — VITAMIN D 25 HYDROXY (VIT D DEFICIENCY, FRACTURES): VITD: 26.25 ng/mL — ABNORMAL LOW (ref 30.00–100.00)

## 2014-03-27 MED ORDER — OMEPRAZOLE 20 MG PO CPDR: 20.0000 mg | DELAYED_RELEASE_CAPSULE | Freq: Every day | ORAL | Status: DC

## 2014-03-27 NOTE — Assessment & Plan Note (Signed)
Chronic problem.  Tolerating statin w/o difficulty.  Check labs.  Adjust meds prn  

## 2014-03-27 NOTE — Assessment & Plan Note (Signed)
New.  Pt reports hx of similar that resolved w/ hysterectomy.  Pt is asymptomatic today in office but reports that she will often feel 'movement' in pelvis.  Normal PE.  If no improvement in sxs, will get US to assess.  Pt expressed understanding and is in agreement w/ plan.

## 2014-03-27 NOTE — Progress Notes (Signed)
   Subjective:    Patient ID: Nicole Douglas, female    DOB: 1946-05-17, 68 y.o.   MRN: [id number]  HPI Here today for CPE.  Risk Factors: HTN- chronic problem, on Losartan, Metoprolol.  Adequate control.  No CP, SOB, HAs, visual changes, edema.  Hyperlipidemia- chronic problem, on Lipitor.  No abd pain, N/V.  Physical Activity: walking 'periodically'. Fall Risk: moderate due to intermittent vertigo Depression: denies Hearing: normal to conversational tones and whispered voice at 6 ft ADL's: independent Cognitive: normal linear thought process, memory and attention intact Home Safety: safe at home, lives w/ husband Height, Weight, BMI, Visual Acuity: see vitals, vision corrected to 20/20 w/ glasses Counseling: UTD on colonoscopy, mammogram, pap.  Pt reports she had DEXA but we have no record of this.  Thinks it was 3-4 yrs ago.  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, [facility] Labs Ordered: See A&P Care Plan: See A&P    Review of Systems Patient reports no vision/ hearing changes, adenopathy,fever, weight change, swallowing issues, chest pain, palpitations, edema, persistant/recurrent cough, hemoptysis, dyspnea (rest/exertional/paroxysmal nocturnal), gastrointestinal bleeding (melena, rectal bleeding), abdominal pain, bowel changes, GU symptoms (dysuria, hematuria, incontinence), Gyn symptoms (abnormal  bleeding, pain),  syncope, focal weakness, memory loss, numbness & tingling, skin/hair/nail changes, abnormal bruising or bleeding, anxiety, or depression.   Pt reports lower abd/pelvic 'movement.  Just like a fetus'.  No pain.  + indigestion.  'like something in there, trying to get out'.  sxs started 6 weeks ago.  Pt reports similar sxs previously and resolved s/p hysterectomy.  + GERD + hoarseness    Objective:   Physical Exam General Appearance:    Alert, cooperative, no distress, appears stated age  Head:    Normocephalic, without  obvious abnormality, atraumatic  Eyes:    PERRL, conjunctiva/corneas clear, EOM's intact, fundi    benign, both eyes  Ears:    Normal TM's and external ear canals, both ears  Nose:   Nares normal, septum midline, mucosa normal, no drainage    or sinus tenderness  Throat:   Lips, mucosa, and tongue normal; teeth and gums normal  Neck:   Supple, symmetrical, trachea midline, no adenopathy;    Thyroid: no enlargement/tenderness/nodules  Back:     Symmetric, no curvature, ROM normal, no CVA tenderness  Lungs:     Clear to auscultation bilaterally, respirations unlabored  Chest Wall:    No tenderness or deformity   Heart:    Regular rate and rhythm, S1 and S2 normal, no murmur, rub   or gallop  Breast Exam:    Deferred to mammo  Abdomen:     Soft, non-tender, bowel sounds active all four quadrants,    no masses, no organomegaly  Genitalia:    Deferred at pt's request  Rectal:    Extremities:   Extremities normal, atraumatic, no cyanosis or edema  Pulses:   2+ and symmetric all extremities  Skin:   Skin color, texture, turgor normal, no rashes or lesions  Lymph nodes:   Cervical, supraclavicular, and axillary nodes normal  Neurologic:   CNII-XII intact, normal strength, sensation and reflexes    throughout          Assessment & Plan:

## 2014-03-27 NOTE — Assessment & Plan Note (Signed)
Pt's PE WNL.  UTD on pap, mammo, colonoscopy.  Due for DEXA- pt prefers to wait until her mammo appt next year to avoid extra trip to imaging center- order entered.  Written screening schedule updated and given to pt.  Check labs.  Anticipatory guidance provided.

## 2014-03-27 NOTE — Assessment & Plan Note (Signed)
New.  Pt reports increased reflux sxs and intermittent hoarseness.  Start PPI.  Monitor for symptom improvement.  Pt expressed understanding and is in agreement w/ plan.

## 2014-03-27 NOTE — Progress Notes (Signed)
Pre visit review using our clinic review tool, if applicable. No additional management support is needed unless otherwise documented below in the visit note. 

## 2014-03-27 NOTE — Patient Instructions (Addendum)
Follow up in 6 months to recheck cholesterol and BP We'll notify you of your lab results and make any changes if needed Start the Omeprazole daily for reflux- this should improve your reflux and your hoarseness Try and get regular exercise and make healthy food choices If you continue to have the pelvic symptoms, please call and we'll do an Ultrasound Call with any questions or concerns Happy Fall!

## 2014-03-27 NOTE — Assessment & Plan Note (Signed)
Chronic problem.  Adequate control.  Asymptomatic.  Check labs.  No anticipated changes. 

## 2014-03-28 ENCOUNTER — Encounter: Payer: Self-pay | Admitting: General Practice

## 2014-03-28 ENCOUNTER — Other Ambulatory Visit: Payer: Self-pay | Admitting: General Practice

## 2014-03-28 DIAGNOSIS — R7989 Other specified abnormal findings of blood chemistry: Secondary | ICD-10-CM

## 2014-03-28 MED ORDER — LEVOTHYROXINE SODIUM 50 MCG PO TABS: 50.0000 ug | ORAL_TABLET | Freq: Every day | ORAL | Status: DC

## 2014-03-28 MED ORDER — VITAMIN D (ERGOCALCIFEROL) 1.25 MG (50000 UNIT) PO CAPS: 50000.0000 [IU] | ORAL_CAPSULE | ORAL | Status: DC

## 2014-04-19 ENCOUNTER — Other Ambulatory Visit: Payer: Self-pay | Admitting: Family Medicine

## 2014-04-19 NOTE — Telephone Encounter (Signed)
Med filled.  

## 2014-05-23 ENCOUNTER — Encounter: Payer: Self-pay | Admitting: General Practice

## 2014-05-23 ENCOUNTER — Other Ambulatory Visit (INDEPENDENT_AMBULATORY_CARE_PROVIDER_SITE_OTHER): Payer: Medicare Other

## 2014-05-23 DIAGNOSIS — I10 Essential (primary) hypertension: Secondary | ICD-10-CM

## 2014-05-23 DIAGNOSIS — R7989 Other specified abnormal findings of blood chemistry: Secondary | ICD-10-CM

## 2014-05-23 LAB — TSH: TSH: 2.64 u[IU]/mL (ref 0.35–4.50)

## 2014-06-12 ENCOUNTER — Telehealth: Payer: Self-pay | Admitting: Family Medicine

## 2014-06-12 MED ORDER — LOSARTAN POTASSIUM 50 MG PO TABS: 100.0000 mg | ORAL_TABLET | Freq: Every day | ORAL | Status: DC

## 2014-06-12 MED ORDER — ATORVASTATIN CALCIUM 10 MG PO TABS: 10.0000 mg | ORAL_TABLET | Freq: Every day | ORAL | Status: DC

## 2014-06-12 NOTE — Telephone Encounter (Signed)
Caller name: Vernal Relation to ZO:XWRUpt:self Call back number: [phone] Pharmacy: [facility] [address] [place]  Reason for call:   Patient would like a refill of losartan and atorvastatin

## 2014-06-12 NOTE — Telephone Encounter (Signed)
Med filled.  

## 2014-07-20 ENCOUNTER — Other Ambulatory Visit: Payer: Self-pay | Admitting: Family Medicine

## 2014-07-22 NOTE — Telephone Encounter (Signed)
Med filled.  

## 2014-08-18 ENCOUNTER — Other Ambulatory Visit: Payer: Self-pay | Admitting: Family Medicine

## 2014-08-19 NOTE — Telephone Encounter (Signed)
Med filled.  

## 2014-09-18 ENCOUNTER — Other Ambulatory Visit: Payer: Self-pay | Admitting: Family Medicine

## 2014-09-25 ENCOUNTER — Other Ambulatory Visit: Payer: Self-pay | Admitting: Family Medicine

## 2014-09-25 NOTE — Telephone Encounter (Signed)
Med filled.  

## 2014-10-01 ENCOUNTER — Ambulatory Visit (INDEPENDENT_AMBULATORY_CARE_PROVIDER_SITE_OTHER): Payer: Medicare Other | Admitting: Family Medicine

## 2014-10-01 ENCOUNTER — Encounter: Payer: Self-pay | Admitting: Family Medicine

## 2014-10-01 VITALS — BP 130/78 | HR 59 | Temp 98.0°F | Resp 16 | Wt 183.0 lb

## 2014-10-01 DIAGNOSIS — E039 Hypothyroidism, unspecified: Secondary | ICD-10-CM | POA: Insufficient documentation

## 2014-10-01 DIAGNOSIS — I10 Essential (primary) hypertension: Secondary | ICD-10-CM | POA: Diagnosis not present

## 2014-10-01 DIAGNOSIS — E785 Hyperlipidemia, unspecified: Secondary | ICD-10-CM | POA: Diagnosis not present

## 2014-10-01 DIAGNOSIS — E038 Other specified hypothyroidism: Secondary | ICD-10-CM

## 2014-10-01 LAB — HEPATIC FUNCTION PANEL
ALT: 19 U/L (ref 0–35)
AST: 17 U/L (ref 0–37)
Albumin: 3.9 g/dL (ref 3.5–5.2)
Alkaline Phosphatase: 135 U/L — ABNORMAL HIGH (ref 39–117)
BILIRUBIN DIRECT: 0.2 mg/dL (ref 0.0–0.3)
TOTAL PROTEIN: 7.2 g/dL (ref 6.0–8.3)
Total Bilirubin: 1 mg/dL (ref 0.2–1.2)

## 2014-10-01 LAB — CBC WITH DIFFERENTIAL/PLATELET
Basophils Absolute: 0 10*3/uL (ref 0.0–0.1)
Basophils Relative: 0.5 % (ref 0.0–3.0)
EOS PCT: 3.1 % (ref 0.0–5.0)
Eosinophils Absolute: 0.1 10*3/uL (ref 0.0–0.7)
HCT: 37.3 % (ref 36.0–46.0)
Hemoglobin: 12.6 g/dL (ref 12.0–15.0)
Lymphocytes Relative: 33.6 % (ref 12.0–46.0)
Lymphs Abs: 1.6 10*3/uL (ref 0.7–4.0)
MCHC: 33.9 g/dL (ref 30.0–36.0)
MCV: 83.6 fl (ref 78.0–100.0)
MONOS PCT: 9.9 % (ref 3.0–12.0)
Monocytes Absolute: 0.5 10*3/uL (ref 0.1–1.0)
NEUTROS PCT: 52.9 % (ref 43.0–77.0)
Neutro Abs: 2.5 10*3/uL (ref 1.4–7.7)
PLATELETS: 173 10*3/uL (ref 150.0–400.0)
RBC: 4.47 Mil/uL (ref 3.87–5.11)
RDW: 15 % (ref 11.5–15.5)
WBC: 4.7 10*3/uL (ref 4.0–10.5)

## 2014-10-01 LAB — BASIC METABOLIC PANEL
BUN: 15 mg/dL (ref 6–23)
CALCIUM: 9.3 mg/dL (ref 8.4–10.5)
CO2: 30 meq/L (ref 19–32)
CREATININE: 0.77 mg/dL (ref 0.40–1.20)
Chloride: 107 mEq/L (ref 96–112)
GFR: 95.25 mL/min (ref >60.00)
Glucose, Bld: 109 mg/dL — ABNORMAL HIGH (ref 70–99)
Potassium: 3.9 mEq/L (ref 3.5–5.1)
SODIUM: 141 meq/L (ref 135–145)

## 2014-10-01 LAB — LIPID PANEL
Cholesterol: 141 mg/dL (ref 0–200)
HDL: 45 mg/dL (ref >39.00)
LDL CALC: 79 mg/dL (ref 0–99)
NONHDL: 96
Total CHOL/HDL Ratio: 3
Triglycerides: 85 mg/dL (ref 0.0–149.0)
VLDL: 17 mg/dL (ref 0.0–40.0)

## 2014-10-01 LAB — TSH: TSH: 1.81 u[IU]/mL (ref 0.35–4.50)

## 2014-10-01 NOTE — Assessment & Plan Note (Signed)
Relatively new problem for pt.  Pt is currently asymptomatic w/ exception of hot flashes- may be due to her hormone replacement, may be thyroid related.  Check labs.  Adjust meds prn.  Pt expressed understanding and is in agreement w/ plan.

## 2014-10-01 NOTE — Patient Instructions (Signed)
Schedule your complete physical in 6 months We'll notify you of your lab results and make any changes if needed Please call and ask your insurance if they have a preferred estrogen medication Keep up the good work!  You look great! Call with any questions or concerns Happy Spring!

## 2014-10-01 NOTE — Assessment & Plan Note (Signed)
Chronic problem, tolerating statin w/o difficulty.  Check labs.  Adjust meds prn  

## 2014-10-01 NOTE — Progress Notes (Signed)
   Subjective:    Patient ID: Nicole Douglas, female    DOB: July 10, 1945, 69 y.o.   MRN: [id number]  HPI HTN- chronic problem, on Losartan, metoprolol.  No CP, SOB, HAs, visual changes, edema.  Hyperlipidemia- chronic problem, on lipitor.  No abd pain, N/V, myalgias  Hypothyroid- abnormal TSH in October.  Started on Levothyroxine 50mcg and thyroid normalized on Dec labs.  + hot flashes.  Denies fatigue, weight gain.     Review of Systems For ROS see HPI     Objective:   Physical Exam  Constitutional: She is oriented to person, place, and time. She appears well-developed and well-nourished. No distress.  HENT:  Head: Normocephalic and atraumatic.  Eyes: Conjunctivae and EOM are normal. Pupils are equal, round, and reactive to light.  Neck: Normal range of motion. Neck supple. No thyromegaly present.  Cardiovascular: Normal rate, regular rhythm, normal heart sounds and intact distal pulses.   No murmur heard. Pulmonary/Chest: Effort normal and breath sounds normal. No respiratory distress.  Abdominal: Soft. She exhibits no distension. There is no tenderness.  Musculoskeletal: She exhibits no edema.  Lymphadenopathy:    She has no cervical adenopathy.  Neurological: She is alert and oriented to person, place, and time.  Skin: Skin is warm and dry.  Psychiatric: She has a normal mood and affect. Her behavior is normal.  Vitals reviewed.         Assessment & Plan:

## 2014-10-01 NOTE — Assessment & Plan Note (Signed)
Chronic problem, adequate control.  Asymptomatic.  Check labs.  No anticipated med changes. 

## 2014-10-01 NOTE — Progress Notes (Signed)
Pre visit review using our clinic review tool, if applicable. No additional management support is needed unless otherwise documented below in the visit note. 

## 2014-10-02 ENCOUNTER — Encounter: Payer: Self-pay | Admitting: General Practice

## 2014-10-03 ENCOUNTER — Telehealth: Payer: Self-pay | Admitting: Family Medicine

## 2014-10-03 NOTE — Telephone Encounter (Signed)
Pt returned your call, please call back.

## 2014-10-03 NOTE — Telephone Encounter (Signed)
Relation to pt: self  Call back number:  [phone]     Reason for call:   [facility] pt if the MG increases to 1 from 0.5 the cost will be cheaper for estradiol. Pt  was last seen  10/01/14 for a follow up insurance advised pt to inquire if  MD can code visit  As a medicare wellness. Please advise

## 2014-10-03 NOTE — Telephone Encounter (Signed)
Ok to increase to 6m daily.  Pt will need to stay UTD on mammo and report immediately if she has any vaginal bleeding.  Also[facility] of [facility] criteria was met.  Her annual wellness exam is in October.

## 2014-10-03 NOTE — Telephone Encounter (Signed)
Error/gd °

## 2014-10-03 NOTE — Telephone Encounter (Signed)
Called pt and LMOVM to return call.  °

## 2014-10-03 NOTE — Telephone Encounter (Signed)
Ok to increase estradiol dose to 1mg ? Medication last filled 11-16-13 #90 with 0

## 2014-10-04 MED ORDER — ESTRADIOL 1 MG PO TABS: 1.0000 mg | ORAL_TABLET | Freq: Every day | ORAL | Status: DC

## 2014-10-04 NOTE — Telephone Encounter (Signed)
Med filled and pt notified. Also pt advised that we cannot change the code on the exam due to her wellness exam being in October.

## 2014-10-18 ENCOUNTER — Other Ambulatory Visit: Payer: Self-pay | Admitting: Family Medicine

## 2014-10-18 NOTE — Telephone Encounter (Signed)
Med filled.  

## 2014-10-21 ENCOUNTER — Other Ambulatory Visit: Payer: Self-pay | Admitting: General Practice

## 2014-10-21 MED ORDER — LEVOTHYROXINE SODIUM 50 MCG PO TABS: 50.0000 ug | ORAL_TABLET | Freq: Every day | ORAL | Status: DC

## 2014-12-05 ENCOUNTER — Other Ambulatory Visit: Payer: Self-pay | Admitting: Family Medicine

## 2014-12-06 ENCOUNTER — Other Ambulatory Visit: Payer: Self-pay | Admitting: Family Medicine

## 2014-12-09 NOTE — Telephone Encounter (Signed)
Med filled.  

## 2014-12-27 ENCOUNTER — Other Ambulatory Visit: Payer: Self-pay | Admitting: General Practice

## 2014-12-27 MED ORDER — METOPROLOL TARTRATE 25 MG PO TABS: 25.0000 mg | ORAL_TABLET | Freq: Two times a day (BID) | ORAL | Status: DC

## 2014-12-27 MED ORDER — LOSARTAN POTASSIUM 50 MG PO TABS: 100.0000 mg | ORAL_TABLET | Freq: Every day | ORAL | Status: DC

## 2014-12-31 ENCOUNTER — Other Ambulatory Visit: Payer: Self-pay | Admitting: General Practice

## 2014-12-31 MED ORDER — ATORVASTATIN CALCIUM 10 MG PO TABS: 10.0000 mg | ORAL_TABLET | Freq: Every day | ORAL | Status: DC

## 2014-12-31 MED ORDER — OMEPRAZOLE 20 MG PO CPDR: 20.0000 mg | DELAYED_RELEASE_CAPSULE | Freq: Every day | ORAL | Status: DC

## 2014-12-31 MED ORDER — LEVOTHYROXINE SODIUM 50 MCG PO TABS: 50.0000 ug | ORAL_TABLET | Freq: Every day | ORAL | Status: DC

## 2015-03-06 DIAGNOSIS — Z13820 Encounter for screening for osteoporosis: Secondary | ICD-10-CM | POA: Diagnosis not present

## 2015-03-06 DIAGNOSIS — E559 Vitamin D deficiency, unspecified: Secondary | ICD-10-CM | POA: Diagnosis not present

## 2015-03-06 DIAGNOSIS — Z780 Asymptomatic menopausal state: Secondary | ICD-10-CM | POA: Diagnosis not present

## 2015-03-06 DIAGNOSIS — Z79890 Hormone replacement therapy (postmenopausal): Secondary | ICD-10-CM | POA: Diagnosis not present

## 2015-03-06 DIAGNOSIS — Z1231 Encounter for screening mammogram for malignant neoplasm of breast: Secondary | ICD-10-CM | POA: Diagnosis not present

## 2015-03-06 LAB — HM DEXA SCAN: HM DEXA SCAN: NORMAL

## 2015-03-06 LAB — HM MAMMOGRAPHY

## 2015-03-12 ENCOUNTER — Encounter: Payer: Self-pay | Admitting: General Practice

## 2015-04-02 ENCOUNTER — Encounter: Payer: Self-pay | Admitting: Family Medicine

## 2015-04-02 ENCOUNTER — Ambulatory Visit (INDEPENDENT_AMBULATORY_CARE_PROVIDER_SITE_OTHER): Payer: Medicare Other | Admitting: Family Medicine

## 2015-04-02 VITALS — BP 126/80 | HR 67 | Temp 98.1°F | Resp 16 | Ht 66.0 in | Wt 182.2 lb

## 2015-04-02 DIAGNOSIS — E785 Hyperlipidemia, unspecified: Secondary | ICD-10-CM | POA: Diagnosis not present

## 2015-04-02 DIAGNOSIS — Z0000 Encounter for general adult medical examination without abnormal findings: Secondary | ICD-10-CM | POA: Diagnosis not present

## 2015-04-02 DIAGNOSIS — I10 Essential (primary) hypertension: Secondary | ICD-10-CM | POA: Diagnosis not present

## 2015-04-02 DIAGNOSIS — Z23 Encounter for immunization: Secondary | ICD-10-CM

## 2015-04-02 DIAGNOSIS — E038 Other specified hypothyroidism: Secondary | ICD-10-CM | POA: Diagnosis not present

## 2015-04-02 LAB — CBC WITH DIFFERENTIAL/PLATELET
BASOS ABS: 0 10*3/uL (ref 0.0–0.1)
BASOS PCT: 0.6 % (ref 0.0–3.0)
EOS ABS: 0.1 10*3/uL (ref 0.0–0.7)
Eosinophils Relative: 2.5 % (ref 0.0–5.0)
HCT: 40.5 % (ref 36.0–46.0)
HEMOGLOBIN: 13.4 g/dL (ref 12.0–15.0)
Lymphocytes Relative: 31.8 % (ref 12.0–46.0)
Lymphs Abs: 1.8 10*3/uL (ref 0.7–4.0)
MCHC: 33.2 g/dL (ref 30.0–36.0)
MCV: 85.7 fl (ref 78.0–100.0)
MONO ABS: 0.6 10*3/uL (ref 0.1–1.0)
Monocytes Relative: 9.7 % (ref 3.0–12.0)
Neutro Abs: 3.2 10*3/uL (ref 1.4–7.7)
Neutrophils Relative %: 55.4 % (ref 43.0–77.0)
Platelets: 188 10*3/uL (ref 150.0–400.0)
RBC: 4.73 Mil/uL (ref 3.87–5.11)
RDW: 15.3 % (ref 11.5–15.5)
WBC: 5.8 10*3/uL (ref 4.0–10.5)

## 2015-04-02 LAB — BASIC METABOLIC PANEL
BUN: 11 mg/dL (ref 6–23)
CHLORIDE: 104 meq/L (ref 96–112)
CO2: 33 meq/L — AB (ref 19–32)
CREATININE: 0.83 mg/dL (ref 0.40–1.20)
Calcium: 9.5 mg/dL (ref 8.4–10.5)
GFR: 87.22 mL/min (ref >60.00)
GLUCOSE: 106 mg/dL — AB (ref 70–99)
Potassium: 3.9 mEq/L (ref 3.5–5.1)
Sodium: 142 mEq/L (ref 135–145)

## 2015-04-02 LAB — TSH: TSH: 1.28 u[IU]/mL (ref 0.35–4.50)

## 2015-04-02 LAB — LIPID PANEL
CHOL/HDL RATIO: 3
Cholesterol: 154 mg/dL (ref 0–200)
HDL: 49.9 mg/dL (ref >39.00)
LDL Cholesterol: 84 mg/dL (ref 0–99)
NONHDL: 104.19
TRIGLYCERIDES: 102 mg/dL (ref 0.0–149.0)
VLDL: 20.4 mg/dL (ref 0.0–40.0)

## 2015-04-02 LAB — HEPATIC FUNCTION PANEL
ALT: 20 U/L (ref 0–35)
AST: 19 U/L (ref 0–37)
Albumin: 4.2 g/dL (ref 3.5–5.2)
Alkaline Phosphatase: 163 U/L — ABNORMAL HIGH (ref 39–117)
BILIRUBIN TOTAL: 0.9 mg/dL (ref 0.2–1.2)
Bilirubin, Direct: 0.2 mg/dL (ref 0.0–0.3)
TOTAL PROTEIN: 7.8 g/dL (ref 6.0–8.3)

## 2015-04-02 NOTE — Progress Notes (Signed)
Pre visit review using our clinic review tool, if applicable. No additional management support is needed unless otherwise documented below in the visit note. 

## 2015-04-02 NOTE — Assessment & Plan Note (Signed)
Chronic problem.  Tolerating statin w/o difficulty.  Check labs.  Adjust meds prn  

## 2015-04-02 NOTE — Patient Instructions (Signed)
Follow up in 6 months to recheck BP, cholesterol We'll notify you of your lab results and make any changes if needed Continue to work on healthy diet and regular exercise- you look great! You are up to date on mammo (due next Sept), bone density (due Sept 2018), colonoscopy (due 2022)- yay!!! Call with any questions or concerns Happy Fall!!!

## 2015-04-02 NOTE — Assessment & Plan Note (Signed)
Pt's PE WNL.  UTD on colonoscopy, mammo, DEXA.  Written screening schedule updated and given to pt.  Encouraged healthy diet, regular exercise.  Check labs.  Prevnar given.  Anticipatory guidance provided.

## 2015-04-02 NOTE — Assessment & Plan Note (Signed)
Chronic problem.  Currently asymptomatic.  Check labs.  Adjust meds prn  

## 2015-04-02 NOTE — Assessment & Plan Note (Signed)
Chronic problem.  Adequate control.  Asymptomatic.  Check labs.  No anticipated med changes 

## 2015-04-02 NOTE — Progress Notes (Signed)
   Subjective:    Patient ID: Nicole Douglas, female    DOB: 1945/11/08, 69 y.o.   MRN: [id number]  HPI Here today for CPE.  Risk Factors: HTN- chronic problem, on Losartan, Metoprolol Hyperlipidemia- chronic problem, on Lipitor Hypothyroid- chronic problem, on Synthroid Physical Activity: exercising regularly Fall Risk: Low Depression: denies Hearing: normal to conversational tones at 6 ft ADL's: independent Cognitive: normal linear thought process, memory and attention intact Home Safety: safe at home Height, Weight, BMI, Visual Acuity: see vitals, vision corrected to 20/20 w/ glasses Counseling: UTD on colonoscopy (due 2022), mammo, DEXA Care team reviewed and updated w/ pt Labs Ordered: See A&P Care Plan: See A&P    Review of Systems Patient reports no vision/ hearing changes, adenopathy,fever, weight change,  persistant/recurrent hoarseness , swallowing issues, chest pain, palpitations, edema, persistant/recurrent cough, hemoptysis, dyspnea (rest/exertional/paroxysmal nocturnal), gastrointestinal bleeding (melena, rectal bleeding), abdominal pain, significant heartburn, bowel changes, GU symptoms (dysuria, hematuria, incontinence), Gyn symptoms (abnormal  bleeding, pain),  syncope, focal weakness, memory loss, numbness & tingling, skin/hair/nail changes, abnormal bruising or bleeding, anxiety, or depression.     Objective:   Physical Exam        Assessment & Plan:

## 2015-04-03 ENCOUNTER — Other Ambulatory Visit (INDEPENDENT_AMBULATORY_CARE_PROVIDER_SITE_OTHER): Payer: Medicare Other

## 2015-04-03 DIAGNOSIS — R748 Abnormal levels of other serum enzymes: Secondary | ICD-10-CM

## 2015-04-03 LAB — GAMMA GT: GGT: 18 U/L (ref 7–51)

## 2015-04-07 ENCOUNTER — Encounter: Payer: Self-pay | Admitting: General Practice

## 2015-04-08 ENCOUNTER — Telehealth: Payer: Self-pay

## 2015-04-08 NOTE — Telephone Encounter (Signed)
error 

## 2015-04-17 ENCOUNTER — Ambulatory Visit: Payer: Medicare Other

## 2015-05-05 ENCOUNTER — Other Ambulatory Visit: Payer: Self-pay | Admitting: Family Medicine

## 2015-05-05 NOTE — Telephone Encounter (Signed)
Medication filled to pharmacy as requested.   

## 2015-07-21 ENCOUNTER — Telehealth: Payer: Self-pay | Admitting: Family Medicine

## 2015-07-21 NOTE — Telephone Encounter (Signed)
Pt had flu shot in November at [facility]. °

## 2015-07-21 NOTE — Telephone Encounter (Signed)
Noted chart updated to reflect.  

## 2015-10-01 ENCOUNTER — Ambulatory Visit: Payer: Medicare Other | Admitting: Family

## 2015-10-07 ENCOUNTER — Ambulatory Visit (INDEPENDENT_AMBULATORY_CARE_PROVIDER_SITE_OTHER): Payer: Medicare Other | Admitting: Family

## 2015-10-07 ENCOUNTER — Encounter: Payer: Self-pay | Admitting: Family

## 2015-10-07 VITALS — BP 130/70 | HR 63 | Temp 98.4°F | Resp 20 | Ht 66.0 in | Wt 181.2 lb

## 2015-10-07 DIAGNOSIS — E039 Hypothyroidism, unspecified: Secondary | ICD-10-CM | POA: Diagnosis not present

## 2015-10-07 DIAGNOSIS — E785 Hyperlipidemia, unspecified: Secondary | ICD-10-CM | POA: Diagnosis not present

## 2015-10-07 DIAGNOSIS — I10 Essential (primary) hypertension: Secondary | ICD-10-CM | POA: Diagnosis not present

## 2015-10-07 DIAGNOSIS — K219 Gastro-esophageal reflux disease without esophagitis: Secondary | ICD-10-CM

## 2015-10-07 LAB — BASIC METABOLIC PANEL
BUN: 13 mg/dL (ref 6–23)
CALCIUM: 9.2 mg/dL (ref 8.4–10.5)
CO2: 30 meq/L (ref 19–32)
CREATININE: 0.78 mg/dL (ref 0.40–1.20)
Chloride: 107 mEq/L (ref 96–112)
GFR: 93.57 mL/min (ref >60.00)
GLUCOSE: 106 mg/dL — AB (ref 70–99)
Potassium: 4.2 mEq/L (ref 3.5–5.1)
SODIUM: 142 meq/L (ref 135–145)

## 2015-10-07 LAB — TSH: TSH: 1.2 u[IU]/mL (ref 0.35–4.50)

## 2015-10-07 MED ORDER — METOPROLOL TARTRATE 25 MG PO TABS: ORAL_TABLET | ORAL | Status: DC

## 2015-10-07 MED ORDER — ATORVASTATIN CALCIUM 10 MG PO TABS: ORAL_TABLET | ORAL | Status: DC

## 2015-10-07 MED ORDER — LOSARTAN POTASSIUM 50 MG PO TABS: ORAL_TABLET | ORAL | Status: DC

## 2015-10-07 MED ORDER — LEVOTHYROXINE SODIUM 50 MCG PO TABS: ORAL_TABLET | ORAL | Status: DC

## 2015-10-07 MED ORDER — OMEPRAZOLE 20 MG PO CPDR: DELAYED_RELEASE_CAPSULE | ORAL | Status: DC

## 2015-10-07 NOTE — Progress Notes (Signed)
Pre visit review using our clinic review tool, if applicable. No additional management support is needed unless otherwise documented below in the visit note. 

## 2015-10-07 NOTE — Assessment & Plan Note (Signed)
At goal on statin, continue same.

## 2015-10-07 NOTE — Assessment & Plan Note (Signed)
BP at goal, continue lopressor.

## 2015-10-07 NOTE — Assessment & Plan Note (Signed)
Stable on synthroid, continue same, obtain tsh.  

## 2015-10-07 NOTE — Assessment & Plan Note (Signed)
Stable on PPI, continue same.  

## 2015-10-07 NOTE — Patient Instructions (Signed)
Please continue current medications.   Complete lab work prior to leaving.  

## 2015-10-07 NOTE — Progress Notes (Signed)
Subjective:    Patient ID: Nicole Douglas, female    DOB: [birth year], 70 y.o.   MRN: [id number]  HPI  Nicole Douglas is a 70 yr old female who presents today for follow up.  Hypothyroid-  Feels well on current dose of synthroid.   Lab Results  Component Value Date   TSH 1.28 04/02/2015   Maintained on synthroid 50mcg.  HTN- on lopressor BP Readings from Last 3 Encounters:  10/07/15 130/70  04/02/15 126/80  10/01/14 130/78   GERD- on prilosec. Denies gerd symptoms.   Hyperlipidemia- on lipitor. denies myalgia Lab Results  Component Value Date   CHOL 154 04/02/2015   HDL 49.90 04/02/2015   LDLCALC 84 04/02/2015   TRIG 102.0 04/02/2015   CHOLHDL 3 04/02/2015     Review of Systems  Constitutional: Negative for fatigue and unexpected weight change.  Respiratory: Negative for shortness of breath.   Cardiovascular: Negative for chest pain.       Does have some chronic intermittent palpitations which are unchanged.  Musculoskeletal: Negative for myalgias.       Some bilateral elbow pain, using ibuprofen  Neurological: Negative for headaches.   Past Medical History  Diagnosis Date  . Hypertension   . Hyperlipemia   . Thyroid disease      Social History   Social History  . Marital Status: Married    Spouse Name: N/A  . Number of Children: N/A  . Years of Education: N/A   Occupational History  . Not on file.   Social History Main Topics  . Smoking status: Never Smoker   . Smokeless tobacco: Never Used  . Alcohol Use: No  . Drug Use: No  . Sexual Activity: Not on file   Other Topics Concern  . Not on file   Social History Narrative    Past Surgical History  Procedure Laterality Date  . Rotator cuff repair      bilateral  . Back surgery    . Abdominal hysterectomy    . Biopsy breast      left breast    Family History  Problem Relation Age of Onset  . Hypertension      both sides of family  . Heart disease    . Cancer Brother     throat  .  Heart attack Mother   . Heart attack Sister     No Known Allergies  Current Outpatient Prescriptions on File Prior to Visit  Medication Sig Dispense Refill  . aspirin 81 MG tablet Take 81 mg by mouth daily.    . Calcium Carbonate (CALTRATE 600 PO) Take by mouth daily.    Marland Kitchen. estradiol (ESTRACE) 1 MG tablet Take 1 tablet (1 mg total) by mouth daily. 30 tablet 6   No current facility-administered medications on file prior to visit.    BP 130/70 mmHg  Pulse 63  Temp(Src) 98.4 F (36.9 C) (Oral)  Resp 20  Ht 5\' 6"  (1.676 m)  Wt 181 lb 3.2 oz (82.192 kg)  BMI 29.26 kg/m2  SpO2 97%       Objective:   Physical Exam  Constitutional: She is oriented to person, place, and time. She appears well-developed and well-nourished.  HENT:  Head: Normocephalic and atraumatic.  Cardiovascular: Normal rate, regular rhythm and normal heart sounds.   No murmur heard. Pulmonary/Chest: Effort normal and breath sounds normal. No respiratory distress. She has no wheezes.  Musculoskeletal: She exhibits no edema.  Neurological: She is alert and  oriented to person, place, and time.  Psychiatric: She has a normal mood and affect. Her behavior is normal. Judgment and thought content normal.          Assessment & Plan:

## 2015-11-07 ENCOUNTER — Ambulatory Visit (INDEPENDENT_AMBULATORY_CARE_PROVIDER_SITE_OTHER): Payer: Medicare Other | Admitting: Family

## 2015-11-07 ENCOUNTER — Encounter: Payer: Self-pay | Admitting: Family

## 2015-11-07 VITALS — BP 131/80 | HR 67 | Temp 98.4°F | Resp 18 | Ht 66.0 in | Wt 178.0 lb

## 2015-11-07 DIAGNOSIS — K120 Recurrent oral aphthae: Secondary | ICD-10-CM

## 2015-11-07 DIAGNOSIS — J029 Acute pharyngitis, unspecified: Secondary | ICD-10-CM | POA: Diagnosis not present

## 2015-11-07 LAB — POCT RAPID STREP A (OFFICE): RAPID STREP A SCREEN: NEGATIVE

## 2015-11-07 NOTE — Progress Notes (Signed)
Pre visit review using our clinic review tool, if applicable. No additional management support is needed unless otherwise documented below in the visit note. 

## 2015-11-07 NOTE — Patient Instructions (Signed)
Please add claritin 10 mg once daily. For tongue sore you can try gargling with salt water twice daily. Apply oragel (OTC) as needed to sore for pain. For sore throat you can try tylenol as needed or cepacol lozenges. Call if symptoms worsen, if fever, or if symptoms are not improved in 1 week.

## 2015-11-07 NOTE — Progress Notes (Signed)
Subjective:    Patient ID: Nicole Douglas, female    DOB: [birth year], 70 y.o.   MRN: [id number]  HPI  Nicole Douglas is a 70 yr old female who presents today with chief complaint of sore throat. Nicole Douglas also reports a "bump" under her tongue which as been since sunday. Reports that Nicole Douglas feels a pressure/fullness in her ears as well. Reports that her sore throat is "bearable."  5/10. Sore throat started 2 days ago.    Review of Systems    see HPI  Past Medical History  Diagnosis Date  . Hypertension   . Hyperlipemia   . Thyroid disease      Social History   Social History  . Marital Status: Married    Spouse Name: N/A  . Number of Children: N/A  . Years of Education: N/A   Occupational History  . Not on file.   Social History Main Topics  . Smoking status: Never Smoker   . Smokeless tobacco: Never Used  . Alcohol Use: No  . Drug Use: No  . Sexual Activity: Not on file   Other Topics Concern  . Not on file   Social History Narrative    Past Surgical History  Procedure Laterality Date  . Rotator cuff repair      bilateral  . Back surgery    . Abdominal hysterectomy    . Biopsy breast      left breast    Family History  Problem Relation Age of Onset  . Hypertension      both sides of family  . Heart disease    . Cancer Brother     throat  . Heart attack Mother   . Heart attack Sister     No Known Allergies  Current Outpatient Prescriptions on File Prior to Visit  Medication Sig Dispense Refill  . aspirin 81 MG tablet Take 81 mg by mouth daily.    Marland Kitchen atorvastatin (LIPITOR) 10 MG tablet Take 1 tablet by mouth  daily 90 tablet 1  . Calcium Carbonate (CALTRATE 600 PO) Take by mouth daily.    Marland Kitchen estradiol (ESTRACE) 1 MG tablet Take 1 tablet (1 mg total) by mouth daily. 30 tablet 6  . levothyroxine (SYNTHROID, LEVOTHROID) 50 MCG tablet Take 1 tablet by mouth  daily 90 tablet 1  . losartan (COZAAR) 50 MG tablet Take 2 tablets by mouth  daily 180 tablet 1  .  metoprolol tartrate (LOPRESSOR) 25 MG tablet Take 1 tablet by mouth two  times daily 180 tablet 1  . omeprazole (PRILOSEC) 20 MG capsule Take 1 capsule by mouth  daily 90 capsule 1   No current facility-administered medications on file prior to visit.    BP 131/80 mmHg  Pulse 67  Temp(Src) 98.4 F (36.9 C) (Oral)  Resp 18  Ht  (1.676 m)  Wt 178 lb (80.74 kg)  BMI 28.74 kg/m2  SpO2 98%    Objective:   Physical Exam  Constitutional: Nicole Douglas appears well-developed and well-nourished.  HENT:  Right Ear: Tympanic membrane and ear canal normal.  Left Ear: Tympanic membrane and ear canal normal.  Mouth/Throat: No posterior oropharyngeal edema or posterior oropharyngeal erythema.  Small ulcer noted beneath left underside of tongue  Neck:  Very mild cervical LAD  Cardiovascular: Normal rate, regular rhythm and normal heart sounds.   No murmur heard. Pulmonary/Chest: Effort normal and breath sounds normal. No respiratory distress. Nicole Douglas has no wheezes.  Psychiatric: Nicole Douglas has a normal  mood and affect. Her behavior is normal. Judgment and thought content normal.          Assessment & Plan:  Sore throat/aphthous ulcer- viral versus allergy related.  Rapid strep is performed and negative.  Advised pt as below:  Please add claritin 10 mg once daily. For tongue sore you can try gargling with salt water twice daily. Apply oragel (OTC) as needed to sore for pain. For sore throat you can try tylenol as needed or cepacol lozenges. Call if symptoms worsen, if fever, or if symptoms are not improved in 1 week.

## 2015-12-02 ENCOUNTER — Other Ambulatory Visit: Payer: Self-pay | Admitting: Family Medicine

## 2015-12-02 NOTE — Telephone Encounter (Signed)
Pt requested refill of estradiol. Was previously prescribed by Dr Beverely Lowabori. Sent 30 day supply to pharmacy. Sent to PCP to determine if she will continue to manage hormone?

## 2015-12-07 NOTE — Telephone Encounter (Signed)
I will continue for now.

## 2016-01-02 ENCOUNTER — Other Ambulatory Visit: Payer: Self-pay | Admitting: Family

## 2016-03-22 ENCOUNTER — Other Ambulatory Visit: Payer: Self-pay | Admitting: Family

## 2016-04-13 ENCOUNTER — Ambulatory Visit (INDEPENDENT_AMBULATORY_CARE_PROVIDER_SITE_OTHER): Payer: Medicare Other | Admitting: Family

## 2016-04-13 ENCOUNTER — Telehealth: Payer: Self-pay | Admitting: Family

## 2016-04-13 ENCOUNTER — Other Ambulatory Visit: Payer: Self-pay | Admitting: Family

## 2016-04-13 ENCOUNTER — Encounter: Payer: Self-pay | Admitting: Family

## 2016-04-13 VITALS — BP 141/79 | HR 54 | Temp 98.2°F | Resp 16 | Ht 67.0 in | Wt 174.6 lb

## 2016-04-13 DIAGNOSIS — Z1239 Encounter for other screening for malignant neoplasm of breast: Secondary | ICD-10-CM

## 2016-04-13 DIAGNOSIS — Z23 Encounter for immunization: Secondary | ICD-10-CM | POA: Diagnosis not present

## 2016-04-13 DIAGNOSIS — I10 Essential (primary) hypertension: Secondary | ICD-10-CM

## 2016-04-13 DIAGNOSIS — E039 Hypothyroidism, unspecified: Secondary | ICD-10-CM | POA: Diagnosis not present

## 2016-04-13 DIAGNOSIS — Z0000 Encounter for general adult medical examination without abnormal findings: Secondary | ICD-10-CM | POA: Diagnosis not present

## 2016-04-13 DIAGNOSIS — E785 Hyperlipidemia, unspecified: Secondary | ICD-10-CM | POA: Diagnosis not present

## 2016-04-13 DIAGNOSIS — N951 Menopausal and female climacteric states: Secondary | ICD-10-CM

## 2016-04-13 DIAGNOSIS — R350 Frequency of micturition: Secondary | ICD-10-CM | POA: Diagnosis not present

## 2016-04-13 LAB — BASIC METABOLIC PANEL
BUN: 15 mg/dL (ref 6–23)
CHLORIDE: 106 meq/L (ref 96–112)
CO2: 30 meq/L (ref 19–32)
Calcium: 9.7 mg/dL (ref 8.4–10.5)
Creatinine, Ser: 0.79 mg/dL (ref 0.40–1.20)
GFR: 92.07 mL/min (ref >60.00)
GLUCOSE: 104 mg/dL — AB (ref 70–99)
POTASSIUM: 4.7 meq/L (ref 3.5–5.1)
SODIUM: 143 meq/L (ref 135–145)

## 2016-04-13 LAB — LIPID PANEL
CHOLESTEROL: 158 mg/dL (ref 0–200)
HDL: 49 mg/dL (ref >39.00)
LDL Cholesterol: 90 mg/dL (ref 0–99)
NonHDL: 108.61
TRIGLYCERIDES: 93 mg/dL (ref 0.0–149.0)
Total CHOL/HDL Ratio: 3
VLDL: 18.6 mg/dL (ref 0.0–40.0)

## 2016-04-13 LAB — TSH: TSH: 0.33 u[IU]/mL — ABNORMAL LOW (ref 0.35–4.50)

## 2016-04-13 MED ORDER — LEVOTHYROXINE SODIUM 50 MCG PO TABS: ORAL_TABLET | ORAL | 0 refills | Status: DC

## 2016-04-13 NOTE — Progress Notes (Signed)
Pre visit review using our clinic review tool, if applicable. No additional management support is needed unless otherwise documented below in the visit note. 

## 2016-04-13 NOTE — Telephone Encounter (Signed)
Called the patient informed of results/instructions. Scheduled lab appt. In 6 weeks/put order in. Patient verbally understood/agreed to results/instructions.

## 2016-04-13 NOTE — Patient Instructions (Addendum)
Stop omeprazole- let me know if you have recurrent reflux symptoms. Stop estradiol. Let me know if you have severe hot flashes or issues after stopping.  Complete lab work prior to leaving.

## 2016-04-13 NOTE — Progress Notes (Signed)
   Subjective:    Patient ID: Nicole PaulsMarie K Douglas, female    DOB: [birth year], 70 y.o.   MRN: [id number]  HPI  Nicole Douglas is a 70 yr old female who presents today for cpx.  Immunizations: up to date except zostavax.   Diet: tries to eat healthy Exercise: some stretching.   Colonoscopy: 2012,  was normal per patient Dexa: 2016 normal Pap Smear: Mammogram: Due for dental Vision:  2 years ago     Review of Systems  Constitutional: Negative for unexpected weight change.  HENT: Negative for hearing loss and rhinorrhea.   Eyes: Negative for visual disturbance.  Respiratory: Negative for cough.   Cardiovascular: Negative for leg swelling.  Gastrointestinal: Negative for constipation and diarrhea.  Genitourinary: Positive for frequency. Negative for dysuria.  Musculoskeletal: Negative for myalgias.       Reports bilateral elbow tenderness- has been present for a few months  Skin: Negative for rash.  Neurological: Negative for headaches.  Hematological: Negative for adenopathy.  Psychiatric/Behavioral:       Denies depression/anxiety       Objective:   Physical Exam        Assessment & Plan:

## 2016-04-13 NOTE — Progress Notes (Signed)
Subjective:    Nicole Douglas is a 70 y.o. female who presents for Medicare Annual/Subsequent preventive examination.  Preventive Screening-Counseling & Management  Tobacco History  Smoking Status  . Never Smoker  Smokeless Tobacco  . Never Used     Problems Prior to Visit 1. Hyperlipidemia-maintained on lipitor.  Lab Results  Component Value Date   CHOL 154 04/02/2015   HDL 49.90 04/02/2015   LDLCALC 84 04/02/2015   TRIG 102.0 04/02/2015   CHOLHDL 3 04/02/2015   2.  HTN- She continues losartan.  Eats a low sodium diet.  BP Readings from Last 3 Encounters:  04/13/16 (!) 141/79  11/07/15 131/80  10/07/15 130/70   3.  GERD- on omeprazole.  Reports that her symptoms are stable. She has never tried to [place].    4.  HRT- has been on estrace since age 74.  Reports that she only take 1/2 tab once daily.   5. Hypothyroid-  Lab Results  Component Value Date   TSH 1.20 10/07/2015      Current Problems (verified) Patient Active Problem List   Diagnosis Date Noted  . Hypothyroidism 10/01/2014  . GERD (gastroesophageal reflux disease) 03/27/2014  . Pelvic pressure in female 03/27/2014  . Physical exam 03/27/2014  . Tinnitus 10/31/2013  . Right hand pain 10/26/2013  . Dizziness 10/19/2013  . HTN (hypertension) 09/20/2013  . Hyperlipidemia 09/20/2013  . Degenerative disc disease, lumbar 09/20/2013  . Lumbar strain 09/15/2013  . PARESTHESIA 03/10/2007  . PELVIC  PAIN 03/10/2007  . PALPITATIONS, HX OF 03/10/2007  . COLONOSCOPY, HX OF 03/10/2007    Medications Prior to Visit Current Outpatient Prescriptions on File Prior to Visit  Medication Sig Dispense Refill  . aspirin 81 MG tablet Take 81 mg by mouth daily.    Marland Kitchen atorvastatin (LIPITOR) 10 MG tablet TAKE 1 TABLET BY MOUTH  DAILY 90 tablet 0  . Calcium Carbonate (CALTRATE 600 PO) Take by mouth daily.    Marland Kitchen estradiol (ESTRACE) 1 MG tablet TAKE 1 TABLET(1 MG) BY MOUTH DAILY 30 tablet 2  . levothyroxine (SYNTHROID,  LEVOTHROID) 50 MCG tablet TAKE 1 TABLET BY MOUTH  DAILY 90 tablet 0  . losartan (COZAAR) 50 MG tablet TAKE 2 TABLETS BY MOUTH  DAILY 180 tablet 0  . metoprolol tartrate (LOPRESSOR) 25 MG tablet TAKE 1 TABLET BY MOUTH TWO  TIMES DAILY 180 tablet 0  . omeprazole (PRILOSEC) 20 MG capsule TAKE 1 CAPSULE BY MOUTH  DAILY 90 capsule 0   No current facility-administered medications on file prior to visit.     Current Medications (verified) Current Outpatient Prescriptions  Medication Sig Dispense Refill  . aspirin 81 MG tablet Take 81 mg by mouth daily.    Marland Kitchen atorvastatin (LIPITOR) 10 MG tablet TAKE 1 TABLET BY MOUTH  DAILY 90 tablet 0  . Calcium Carbonate (CALTRATE 600 PO) Take by mouth daily.    Marland Kitchen estradiol (ESTRACE) 1 MG tablet TAKE 1 TABLET(1 MG) BY MOUTH DAILY 30 tablet 2  . levothyroxine (SYNTHROID, LEVOTHROID) 50 MCG tablet TAKE 1 TABLET BY MOUTH  DAILY 90 tablet 0  . losartan (COZAAR) 50 MG tablet TAKE 2 TABLETS BY MOUTH  DAILY 180 tablet 0  . metoprolol tartrate (LOPRESSOR) 25 MG tablet TAKE 1 TABLET BY MOUTH TWO  TIMES DAILY 180 tablet 0  . omeprazole (PRILOSEC) 20 MG capsule TAKE 1 CAPSULE BY MOUTH  DAILY 90 capsule 0   No current facility-administered medications for this visit.  Allergies (verified) Review of patient's allergies indicates no known allergies.   PAST HISTORY  Family History Family History  Problem Relation Age of Onset  . Hypertension      both sides of family  . Heart disease    . Cancer Brother     throat  . Heart attack Mother     died at 6872  . Heart attack Sister     age 71  . Prostate cancer Father     Social History Social History  Substance Use Topics  . Smoking status: Never Smoker  . Smokeless tobacco: Never Used  . Alcohol use No     Are there smokers in your home (other than you)? No  Risk Factors Current exercise habits: add some regular walking  Dietary issues discussed: continue healthy diet   Cardiac risk factors: advanced  age (older than 5255 for men, 465 for women), dyslipidemia and hypertension.  Depression Screen (Note: if answer to either of the following is "Yes", a more complete depression screening is indicated)   Over the past two weeks, have you felt down, depressed or hopeless? No  Over the past two weeks, have you felt little interest or pleasure in doing things? No  Have you lost interest or pleasure in daily life? No  Do you often feel hopeless? No  Do you cry easily over simple problems? No  Activities of Daily Living In your present state of health, do you have any difficulty performing the following activities?:  Driving? No Managing money?  No Feeding yourself? No Getting from bed to chair? No . Climbing a flight of stairs? No Preparing food and eating?: No Bathing or showering? No Getting dressed: No Getting to the toilet? No Using the toilet:No Moving around from place to place: No In the past year have you fallen or had a near fall?:No   Are you sexually active?  Yes  Do you have more than one partner?  No  Hearing Difficulties: No Do you often ask people to speak up or repeat themselves? No Do you experience ringing or noises in your ears? Yes Do you have difficulty understanding soft or whispered voices? No   Do you feel that you have a problem with memory? No  Do you often misplace items? No  Do you feel safe at home?  Yes  Cognitive Testing  Alert? Yes  Normal Appearance?Yes  Oriented to person? Yes  Place? Yes   Time? Yes  Recall of three objects?  Yes  Can perform simple calculations? Yes  Displays appropriate judgment?Yes  Can read the correct time from a watch face?Yes   Advanced Directives have been discussed with the patient? Yes  List the Names of Other Physician/Practitioners you currently use: 1.    Indicate any recent Medical Services you may have received from other than [facility] providers in the past year (date may be approximate).  Immunization History   Administered Date(s) Administered  . Influenza,inj,Quad PF,36+ Mos 03/27/2014, 04/22/2015  . Pneumococcal Conjugate-13 04/02/2015  . Pneumococcal Polysaccharide-23 09/20/2013  . Tdap 03/27/2008    Screening Tests Health Maintenance  Topic Date Due  . ZOSTAVAX  07/15/2004  . INFLUENZA VACCINE  01/20/2016  . MAMMOGRAM  03/05/2016  . Hepatitis C Screening  04/13/2017 (Originally [birth year])  . DEXA SCAN  03/05/2017  . TETANUS/TDAP  03/27/2018  . COLONOSCOPY  08/19/2020  . PNA vac Low Risk Adult  Completed    All answers were reviewed with the patient and necessary  referrals were made:  O'SULLIVAN,Prarthana Parlin S., NP   04/13/2016   History reviewed: allergies, current medications, past family history, past medical history, past social history, past surgical history and problem list  Review of Systems see attached HPI    Objective:   Body mass index is 27.35 kg/m. BP (!) 141/79 (BP Location: Right Arm, Patient Position: Sitting, Cuff Size: Normal)   Pulse (!) 54   Temp 98.2 F (36.8 C) (Oral)   Resp 16   Ht 5\' 7"  (1.702 m)   Wt 174 lb 9.6 oz (79.2 kg)   SpO2 98% Comment: room air  BMI 27.35 kg/m   Physical Exam  Constitutional: She is oriented to person, place, and time. She appears well-developed and well-nourished. No distress.  HENT:  Head: Normocephalic and atraumatic.  Right Ear: Tympanic membrane and ear canal normal.  Left Ear: Tympanic membrane and ear canal normal.  Mouth/Throat: Oropharynx is clear and moist.  Eyes: Pupils are equal, round, and reactive to light. No scleral icterus.  Neck: Normal range of motion. No thyromegaly present.  Cardiovascular: Normal rate and regular rhythm.   No murmur heard. Pulmonary/Chest: Effort normal and breath sounds normal. No respiratory distress. He has no wheezes. She has no rales. She exhibits no tenderness.  Abdominal: Soft. Bowel sounds are normal. She exhibits no distension and no mass. There is no tenderness. There  is no rebound and no guarding.  Musculoskeletal: She exhibits no edema.  Lymphadenopathy:    She has no cervical adenopathy.  Neurological: She is alert and oriented to person, place, and time. She has normal patellar reflexes. She exhibits normal muscle tone. Coordination normal.  Skin: Skin is warm and dry.  Psychiatric: She has a normal mood and affect. Her behavior is normal. Judgment and thought content normal.        Assessment & Plan:        Assessment:    Urinary frequency- will obtain UA with reflex culture.       Plan:  EKG tracing is personally reviewed.  EKG notes NSR.  No acute changes.     During the course of the visit the patient was educated and counseled about appropriate screening and preventive services including:    Influenza vaccine  Screening mammography  Advanced directives: has no HCPOA- information provided.  Diet review for nutrition referral? Yes ____  Not Indicated _x___   Patient Instructions (the written plan) was given to the patient.  Medicare Attestation I have personally reviewed: The patient's medical and social history Their use of alcohol, tobacco or illicit drugs Their current medications and supplements The patient's functional ability including ADLs,fall risks, home safety risks, cognitive, and hearing and visual impairment Diet and physical activities Evidence for depression or mood disorders  The patient's weight, height, BMI, and visual acuity have been recorded in the chart.  I have made referrals, counseling, and provided education to the patient based on review of the above and I have provided the patient with a written personalized care plan for preventive services.     O'SULLIVAN,Butler Vegh S., NP   04/13/2016

## 2016-04-13 NOTE — Telephone Encounter (Signed)
Thyroid medication looks a bit strong based on lab results. I would like her to take 1 tab by mouth once daily except 1/2 tablet by mouth on Sundays.Repeat TSH in 6 weeks. Dx hypothyroid.

## 2016-04-13 NOTE — Assessment & Plan Note (Signed)
She has been on estradiol of 16 years. Only taking 1/2 tab "some days.". Advised pt to try discontinuing and we will see how she does.

## 2016-04-13 NOTE — Assessment & Plan Note (Signed)
Tolerating statin, obtain follow-up lipid panel. 

## 2016-04-13 NOTE — Assessment & Plan Note (Signed)
Continue healthy diet. Add regular walking.  I have asked her to check coverage for zostavax with her insurance and book a nurse visit if covered. Flu shot today.

## 2016-04-13 NOTE — Assessment & Plan Note (Signed)
BP is acceptable for her age.  Continue current dose of losartan.

## 2016-04-13 NOTE — Assessment & Plan Note (Signed)
Clinically stable on synthroid, continue same, obtain tsh. 

## 2016-04-14 LAB — URINALYSIS W MICROSCOPIC + REFLEX CULTURE
BACTERIA UA: NONE SEEN [HPF]
BILIRUBIN URINE: NEGATIVE
CASTS: NONE SEEN [LPF]
CRYSTALS: NONE SEEN [HPF]
Glucose, UA: NEGATIVE
HGB URINE DIPSTICK: NEGATIVE
KETONES UR: NEGATIVE
Leukocytes, UA: NEGATIVE
Nitrite: NEGATIVE
Protein, ur: NEGATIVE
RBC / HPF: NONE SEEN RBC/HPF (ref <=2)
SPECIFIC GRAVITY, URINE: 1.022 (ref 1.001–1.035)
SQUAMOUS EPITHELIAL / LPF: NONE SEEN [HPF] (ref <=5)
WBC UA: NONE SEEN WBC/HPF (ref <=5)
Yeast: NONE SEEN [HPF]
pH: 5.5 (ref 5.0–8.0)

## 2016-04-15 ENCOUNTER — Ambulatory Visit (HOSPITAL_BASED_OUTPATIENT_CLINIC_OR_DEPARTMENT_OTHER)
Admission: RE | Admit: 2016-04-15 | Discharge: 2016-04-15 | Disposition: A | Discharge status: Home / Self Care | Payer: Medicare Other | Source: Ambulatory Visit | Attending: Family | Admitting: Family

## 2016-04-15 ENCOUNTER — Encounter (HOSPITAL_BASED_OUTPATIENT_CLINIC_OR_DEPARTMENT_OTHER): Payer: Self-pay

## 2016-04-15 DIAGNOSIS — Z1231 Encounter for screening mammogram for malignant neoplasm of breast: Secondary | ICD-10-CM | POA: Insufficient documentation

## 2016-04-15 DIAGNOSIS — Z1239 Encounter for other screening for malignant neoplasm of breast: Secondary | ICD-10-CM

## 2016-04-29 ENCOUNTER — Other Ambulatory Visit: Payer: Self-pay | Admitting: Family

## 2016-04-29 ENCOUNTER — Telehealth: Payer: Self-pay | Admitting: Family

## 2016-04-29 MED ORDER — OMEPRAZOLE 40 MG PO CPDR: 40.0000 mg | DELAYED_RELEASE_CAPSULE | Freq: Every day | ORAL | 3 refills | Status: DC

## 2016-04-29 NOTE — Progress Notes (Signed)
I sent rx for increase dose of omprazole. If symptoms worsen of fail to improve, needs OV please.

## 2016-04-29 NOTE — Telephone Encounter (Signed)
Patient states that she was taken off of her levothyroxine back on 04/13/16. She states that she is having some indigestion since then and would like to know if there is something else she can go on? Please advise.   Patient phone: [phone]

## 2016-04-30 NOTE — Telephone Encounter (Signed)
See orders only encounter dated for same.

## 2016-04-30 NOTE — Progress Notes (Signed)
Pt notified of instructions and verbalized understanding. 

## 2016-05-24 ENCOUNTER — Other Ambulatory Visit (INDEPENDENT_AMBULATORY_CARE_PROVIDER_SITE_OTHER): Payer: Medicare Other

## 2016-05-24 DIAGNOSIS — E039 Hypothyroidism, unspecified: Secondary | ICD-10-CM

## 2016-05-24 LAB — TSH: TSH: 2.47 u[IU]/mL (ref 0.35–4.50)

## 2016-05-25 ENCOUNTER — Encounter: Payer: Self-pay | Admitting: Family

## 2016-06-25 ENCOUNTER — Telehealth: Payer: Self-pay | Admitting: *Deleted

## 2016-06-25 MED ORDER — ATORVASTATIN CALCIUM 10 MG PO TABS: 10.0000 mg | ORAL_TABLET | Freq: Every day | ORAL | 1 refills | Status: DC

## 2016-06-25 MED ORDER — LEVOTHYROXINE SODIUM 50 MCG PO TABS: ORAL_TABLET | ORAL | 1 refills | Status: DC

## 2016-06-25 MED ORDER — METOPROLOL TARTRATE 25 MG PO TABS: 25.0000 mg | ORAL_TABLET | Freq: Two times a day (BID) | ORAL | 1 refills | Status: DC

## 2016-06-25 MED ORDER — LOSARTAN POTASSIUM 50 MG PO TABS
100.0000 mg | ORAL_TABLET | Freq: Every day | ORAL | 1 refills | Status: DC
Start: 2016-06-25 — End: 2017-02-02

## 2016-06-25 MED ORDER — OMEPRAZOLE 40 MG PO CPDR: 40.0000 mg | DELAYED_RELEASE_CAPSULE | Freq: Every day | ORAL | 1 refills | Status: DC

## 2016-06-25 NOTE — Telephone Encounter (Signed)
Received fax from [facility] requesting refills on: synthroid, metoprolol, cozaar, lipitor and omeprazole. Refills sent.

## 2016-07-02 ENCOUNTER — Telehealth: Payer: Self-pay | Admitting: Family

## 2016-07-02 NOTE — Telephone Encounter (Signed)
My notes state that we stopped her prilosec.  Could you please get more information on her symptoms?

## 2016-07-02 NOTE — Telephone Encounter (Signed)
Relation to ZO:XWRUpt:self Call back number:[phone] [facility] [place] [id number] [place] [phone] (Phone) [phone])     Reason for call:  omeprazole (PRILOSEC) 40 MG patient would like decrease back to 20 MG due to patient food not digesting properly, please advise.

## 2016-07-05 MED ORDER — OMEPRAZOLE 20 MG PO CPDR: 20.0000 mg | DELAYED_RELEASE_CAPSULE | Freq: Every day | ORAL | 1 refills | Status: DC

## 2016-07-05 NOTE — Telephone Encounter (Signed)
Patient returned your call.

## 2016-07-05 NOTE — Telephone Encounter (Signed)
Omeprazole was stopped at 04/13/16 office visit. Pt called back on 04/29/16 stating indigestion symptoms had returned. PCP sent Rx for omeprazole 40mg  at that time. Attempted to reach pt to verify below statement and offer appt if symptoms have worsened or failed to improve per 04/29/16 orders encounter. Left message for pt to return my call.

## 2016-07-05 NOTE — Telephone Encounter (Signed)
rx sent

## 2016-07-05 NOTE — Telephone Encounter (Signed)
Spoke with pt. She states she never filled the 40mg  Rx because she had just picked up the 20mg  rx when we told her to stop it. When she restarted med she continued the 20mg  dose that she already had and states that 20mg  dose has been controlling her symptoms well. Pt is requesting refill of omeprazole 20mg . Rx sent.

## 2016-08-25 ENCOUNTER — Ambulatory Visit (INDEPENDENT_AMBULATORY_CARE_PROVIDER_SITE_OTHER): Payer: Medicare Other | Admitting: Internal Medicine

## 2016-08-25 ENCOUNTER — Encounter: Payer: Self-pay | Admitting: Internal Medicine

## 2016-08-25 VITALS — BP 134/68 | HR 69 | Temp 98.2°F | Resp 14 | Ht 67.0 in | Wt 178.4 lb

## 2016-08-25 DIAGNOSIS — J9801 Acute bronchospasm: Secondary | ICD-10-CM

## 2016-08-25 DIAGNOSIS — J40 Bronchitis, not specified as acute or chronic: Secondary | ICD-10-CM

## 2016-08-25 MED ORDER — PREDNISONE 10 MG PO TABS: ORAL_TABLET | ORAL | 0 refills | Status: DC

## 2016-08-25 MED ORDER — AZITHROMYCIN 250 MG PO TABS: ORAL_TABLET | ORAL | 0 refills | Status: DC

## 2016-08-25 MED ORDER — ALBUTEROL SULFATE HFA 108 (90 BASE) MCG/ACT IN AERS: 2.0000 | INHALATION_SPRAY | Freq: Four times a day (QID) | RESPIRATORY_TRACT | 1 refills | Status: DC | PRN

## 2016-08-25 NOTE — Patient Instructions (Signed)
Rest, fluids , tylenol  For cough:  Take Mucinex DM twice a day as needed until better  For cough and wheezing: Albuterol 2 puffs every 4-6 hours as needed  If  nasal congestion: Use OTC Nasocort or Flonase : 2 nasal sprays on each side of the nose in the morning until you feel better   Take prednisone for a few days, follow the instructions in the bottle  Take the antibiotic as prescribed  (Zithromax)  Call if not gradually better over the next  10 days  Call anytime if the symptoms are severe

## 2016-08-25 NOTE — Progress Notes (Signed)
Subjective:    Patient ID: Nicole Douglas, female    DOB: [birth year], 71 y.o.   MRN: [id number]  DOS:  08/25/2016 Type of visit - description : acute Interval history:  Symptoms started approximately a week ago, dry cough, chest congestion, wheezing. Taking Robitussin-DM with almost no relief. Chart is reviewed, no history of asthma, never a smoker.  Review of Systems  denies fever chills No sinus pain, congestion or nasal discharge No chest pain No nausea, vomiting. No myalgias. HAs, only with cough.  Past Medical History:  Diagnosis Date  . Hyperlipemia   . Hypertension   . Thyroid disease     Past Surgical History:  Procedure Laterality Date  . ABDOMINAL HYSTERECTOMY    . BACK SURGERY    . BIOPSY BREAST     left breast  . ROTATOR CUFF REPAIR     bilateral    Social History   Social History  . Marital status: Married    Spouse name: N/A  . Number of children: N/A  . Years of education: N/A   Occupational History  . Not on file.   Social History Main Topics  . Smoking status: Never Smoker  . Smokeless tobacco: Never Used  . Alcohol use No  . Drug use: No  . Sexual activity: Not on file   Other Topics Concern  . Not on file   Social History Narrative   Retired from [employer]   3 grown children (oldest daughter in [place], Daughter local, Son deceased was murdered)   Married   Enjoys reading, crossword, adult coloring, sewing    No pets      Allergies as of 08/25/2016   No Known Allergies     Medication List       Accurate as of 08/25/16 11:59 PM. Always use your most recent med list.          albuterol 108 (90 Base) MCG/ACT inhaler Commonly known as:  VENTOLIN HFA Inhale 2 puffs into the lungs every 6 (six) hours as needed for wheezing or shortness of breath.   aspirin 81 MG tablet Take 81 mg by mouth daily.   atorvastatin 10 MG tablet Commonly known as:  LIPITOR Take 1 tablet (10 mg total) by mouth daily.   azithromycin 250 MG  tablet Commonly known as:  ZITHROMAX Z-PAK 2 tabs a day the first day, then 1 tab a day x 4 days   CALTRATE 600 PO Take by mouth daily.   estradiol 1 MG tablet Commonly known as:  ESTRACE TAKE 1 TABLET(1 MG) BY MOUTH DAILY   levothyroxine 50 MCG tablet Commonly known as:  SYNTHROID, LEVOTHROID One tablet by mouth once daily except 1/2 tablet by mouth on sundays.   losartan 50 MG tablet Commonly known as:  COZAAR Take 2 tablets (100 mg total) by mouth daily.   metoprolol tartrate 25 MG tablet Commonly known as:  LOPRESSOR Take 1 tablet (25 mg total) by mouth 2 (two) times daily.   omeprazole 20 MG capsule Commonly known as:  PRILOSEC Take 1 capsule (20 mg total) by mouth daily.   predniSONE 10 MG tablet Commonly known as:  DELTASONE 4 tablets x 2 days, 3 tabs x 2 days, 2 tabs x 2 days, 1 tab x 2 days          Objective:   Physical Exam BP 134/68 (BP Location: Left Arm, Patient Position: Sitting, Cuff Size: Normal)   Pulse 69   Temp 98.2 F (  36.8 C) (Oral)   Resp 14   Ht 5\' 7"  (1.702 m)   Wt 178 lb 6 oz (80.9 kg)   SpO2 98%   BMI 27.94 kg/m  General:   Well developed, well nourished . NAD.  HEENT:  Normocephalic . Face symmetric, atraumatic. TMs normal, throat symmetric no red. Nose not congested, sinuses no TTP. Lungs:  Bilateral, mild to moderate wheezing, few rhonchi. No crackles. No increased work of breathing. Normal respiratory effort, no intercostal retractions, no accessory muscle use. Heart: RRR,  no murmur.  No pretibial edema bilaterally  Skin: Not pale. Not jaundice Neurologic:  alert & oriented X3.  Speech normal, gait appropriate for age and unassisted Psych--  Cognition and judgment appear intact.  Cooperative with normal attention span and concentration.  Behavior appropriate. No anxious or depressed appearing.      Assessment & Plan:   71 year old female with history of HTN, hyperlipidemia, hypothyroidism, no smoker, presents  with: Bronchitis, bronchospasm: Presents with cough, wheezing, no history of asthma or tobacco abuse. Vital signs stable. Wheezing likely triggered by bronchitis. Plan: Z-Pak, prednisone, robitussin DM, albuterol. Proper technique of albuterol use discussed. Anticipate she will gradually get better, if he is not back to normal needs to call the office within 10 days to 2 weeks. See AVS

## 2016-08-25 NOTE — Progress Notes (Signed)
Pre visit review using our clinic review tool, if applicable. No additional management support is needed unless otherwise documented below in the visit note. 

## 2016-09-23 ENCOUNTER — Other Ambulatory Visit: Payer: Self-pay

## 2016-09-23 ENCOUNTER — Telehealth: Payer: Self-pay | Admitting: Family

## 2016-09-23 MED ORDER — OMEPRAZOLE 20 MG PO CPDR: 20.0000 mg | DELAYED_RELEASE_CAPSULE | Freq: Every day | ORAL | 1 refills | Status: DC

## 2016-09-23 NOTE — Telephone Encounter (Signed)
Caller name: Vernona Rieger with [facility] Relationship to patient: Can be reached: [phone]  Pharmacy: Ref #: [id number]  Reason for call: Calling about RX for omeprazole. They have 2 different RX strengths on file and want to validate the correct RX. Are we wanting to order the  and dc the previous RX for ? Please call

## 2016-09-23 NOTE — Telephone Encounter (Signed)
Called pharmacy to clarify the medication. The  was dc.   PC

## 2016-10-12 ENCOUNTER — Encounter: Payer: Self-pay | Admitting: Family

## 2016-10-12 ENCOUNTER — Ambulatory Visit (INDEPENDENT_AMBULATORY_CARE_PROVIDER_SITE_OTHER): Payer: Medicare Other | Admitting: Family

## 2016-10-12 VITALS — BP 134/68 | HR 63 | Temp 98.3°F | Resp 16 | Ht 67.0 in | Wt 179.2 lb

## 2016-10-12 DIAGNOSIS — E039 Hypothyroidism, unspecified: Secondary | ICD-10-CM | POA: Diagnosis not present

## 2016-10-12 DIAGNOSIS — E785 Hyperlipidemia, unspecified: Secondary | ICD-10-CM

## 2016-10-12 DIAGNOSIS — R232 Flushing: Secondary | ICD-10-CM

## 2016-10-12 DIAGNOSIS — I10 Essential (primary) hypertension: Secondary | ICD-10-CM | POA: Diagnosis not present

## 2016-10-12 DIAGNOSIS — R002 Palpitations: Secondary | ICD-10-CM

## 2016-10-12 LAB — BASIC METABOLIC PANEL
BUN: 14 mg/dL (ref 6–23)
CALCIUM: 9.4 mg/dL (ref 8.4–10.5)
CHLORIDE: 105 meq/L (ref 96–112)
CO2: 32 meq/L (ref 19–32)
Creatinine, Ser: 0.83 mg/dL (ref 0.40–1.20)
GFR: 86.85 mL/min (ref >60.00)
Glucose, Bld: 119 mg/dL — ABNORMAL HIGH (ref 70–99)
Potassium: 4.1 mEq/L (ref 3.5–5.1)
Sodium: 142 mEq/L (ref 135–145)

## 2016-10-12 LAB — LIPID PANEL
Cholesterol: 156 mg/dL (ref 0–200)
HDL: 54.6 mg/dL (ref >39.00)
LDL Cholesterol: 85 mg/dL (ref 0–99)
NonHDL: 101.15
TRIGLYCERIDES: 82 mg/dL (ref 0.0–149.0)
Total CHOL/HDL Ratio: 3
VLDL: 16.4 mg/dL (ref 0.0–40.0)

## 2016-10-12 LAB — TSH: TSH: 2.22 u[IU]/mL (ref 0.35–4.50)

## 2016-10-12 MED ORDER — GABAPENTIN 300 MG PO CAPS
300.0000 mg | ORAL_CAPSULE | Freq: Every day | ORAL | 1 refills | Status: DC
Start: 2016-10-12 — End: 2017-02-02

## 2016-10-12 NOTE — Progress Notes (Signed)
Pre visit review using our clinic review tool, if applicable. No additional management support is needed unless otherwise documented below in the visit note. 

## 2016-10-12 NOTE — Progress Notes (Signed)
Subjective:    Patient ID: Nicole Douglas, female    DOB: 1946/01/07, 71 y.o.   MRN: [id number]  HPI  Ms. Scarfo is a 71 yr old female who presents today for follow up.  1) Hyperlipidemia- maintained on lipitor .  Denies myalgia. Lab Results  Component Value Date   CHOL 158 04/13/2016   HDL 49.00 04/13/2016   LDLCALC 90 04/13/2016   TRIG 93.0 04/13/2016   CHOLHDL 3 04/13/2016   2) HTN- maintained on losartan and lopressor. Notes some occasional palpitations.   BP Readings from Last 3 Encounters:  10/12/16 134/68  08/25/16 134/68  04/13/16 (!) 141/79   3) Hypothyroid- maintained on synthroid.  Reports feeling well on current on current dose.  Lab Results  Component Value Date   TSH 2.47 05/24/2016   4) Hot Flashes- Off of estrogen since 10/17.  Only occur at night.  Wakes up wet/sweating.   Review of Systems  Respiratory: Negative for shortness of breath.    Past Medical History:  Diagnosis Date  . Hyperlipemia   . Hypertension   . Thyroid disease      Social History   Social History  . Marital status: Married    Spouse name: N/A  . Number of children: N/A  . Years of education: N/A   Occupational History  . Not on file.   Social History Main Topics  . Smoking status: Never Smoker  . Smokeless tobacco: Never Used  . Alcohol use No  . Drug use: No  . Sexual activity: Not on file   Other Topics Concern  . Not on file   Social History Narrative   Retired from [employer]   3 grown children (oldest daughter in [place], Daughter local, Son deceased was murdered)   Married   Enjoys reading, crossword, adult coloring, sewing    No pets    Past Surgical History:  Procedure Laterality Date  . ABDOMINAL HYSTERECTOMY    . BACK SURGERY    . BIOPSY BREAST     left breast  . ROTATOR CUFF REPAIR     bilateral    Family History  Problem Relation Age of Onset  . Hypertension      both sides of family  . Heart disease    . Cancer Brother     throat    . Heart attack Mother     died at 21  . Heart attack Sister     age 48  . Prostate cancer Father     No Known Allergies  Current Outpatient Prescriptions on File Prior to Visit  Medication Sig Dispense Refill  . aspirin 81 MG tablet Take 81 mg by mouth daily.    Marland Kitchen atorvastatin (LIPITOR) 10 MG tablet Take 1 tablet (10 mg total) by mouth daily. 90 tablet 1  . Calcium Carbonate (CALTRATE 600 PO) Take by mouth daily.    Marland Kitchen levothyroxine (SYNTHROID, LEVOTHROID) 50 MCG tablet One tablet by mouth once daily except 1/2 tablet by mouth on sundays. 90 tablet 1  . losartan (COZAAR) 50 MG tablet Take 2 tablets (100 mg total) by mouth daily. 180 tablet 1  . metoprolol tartrate (LOPRESSOR) 25 MG tablet Take 1 tablet (25 mg total) by mouth 2 (two) times daily. 180 tablet 1   No current facility-administered medications on file prior to visit.     BP 134/68 (BP Location: Right Arm, Cuff Size: Normal)   Pulse 63   Temp 98.3 F (36.8 C) (Oral)  Resp 16   Ht  (1.702 m)   Wt 179 lb 3.2 oz (81.3 kg)   SpO2 99% Comment: room air  BMI 28.07 kg/m        Objective:   Physical Exam  Constitutional: She is oriented to person, place, and time. She appears well-developed and well-nourished.  HENT:  Head: Normocephalic and atraumatic.  Eyes: No scleral icterus.  Cardiovascular: Normal rate, regular rhythm and normal heart sounds.   No murmur heard. Pulmonary/Chest: Effort normal and breath sounds normal. No respiratory distress. She has no wheezes.  Musculoskeletal: She exhibits no edema.  Neurological: She is alert and oriented to person, place, and time.  Psychiatric: She has a normal mood and affect. Her behavior is normal. Judgment and thought content normal.          Assessment & Plan:  Hypothyroid- obtain follow up TSH. Continue current dose of synthroid.  Hyperlipidemia- lipids at goal, tolerating statin, continue same.  HTN- BP stable continue current meds. Obtain follow up  bmet.  Palpitations- new. Obtain tsh, CBC, BMET, EKG performed and personally reviewed. Notes sinus bradycardia without acute changes. Pt is advised to let me know if she develops increased frequency of symptoms and we could consider referral to cardiology at that time.   Hot flashes- uncontrolled. Mainly occurring at night. Trial of gabapentin qhs.

## 2016-10-12 NOTE — Patient Instructions (Signed)
Please complete lab work prior to leaving. Start gabapentin  at bedtime for hot flashes.

## 2016-10-13 ENCOUNTER — Encounter: Payer: Self-pay | Admitting: Family

## 2016-10-19 ENCOUNTER — Telehealth: Payer: Self-pay | Admitting: *Deleted

## 2016-10-19 DIAGNOSIS — R739 Hyperglycemia, unspecified: Secondary | ICD-10-CM

## 2016-10-19 NOTE — Telephone Encounter (Signed)
Notified pt and scheduled lab appt for 10/20/16 at 9am. Future order entered.

## 2016-10-19 NOTE — Telephone Encounter (Signed)
-----   Message from Sandford Craze, NP sent at 10/14/2016  8:55 AM EDT ----- Can you please ask her to return to lab for A1C?

## 2016-10-20 ENCOUNTER — Other Ambulatory Visit (INDEPENDENT_AMBULATORY_CARE_PROVIDER_SITE_OTHER): Payer: Medicare Other

## 2016-10-20 DIAGNOSIS — R739 Hyperglycemia, unspecified: Secondary | ICD-10-CM

## 2016-10-20 LAB — HEMOGLOBIN A1C: Hgb A1c MFr Bld: 6 % (ref 4.6–6.5)

## 2016-10-21 ENCOUNTER — Encounter: Payer: Self-pay | Admitting: Family

## 2016-10-21 DIAGNOSIS — R739 Hyperglycemia, unspecified: Secondary | ICD-10-CM

## 2016-10-27 NOTE — Telephone Encounter (Signed)
Pt called in requesting lab results. Please advise.

## 2016-10-27 NOTE — Telephone Encounter (Signed)
See lab letters 4/25/ and 5/6.

## 2016-10-28 NOTE — Telephone Encounter (Signed)
Called patient with lab results. Mailed diet information to patient.

## 2016-12-15 ENCOUNTER — Encounter: Payer: Self-pay | Admitting: Family

## 2016-12-15 ENCOUNTER — Ambulatory Visit (INDEPENDENT_AMBULATORY_CARE_PROVIDER_SITE_OTHER): Payer: Medicare Other | Admitting: Family

## 2016-12-15 VITALS — BP 148/80 | HR 66 | Temp 98.4°F | Resp 18 | Ht 67.0 in | Wt 176.6 lb

## 2016-12-15 DIAGNOSIS — M5417 Radiculopathy, lumbosacral region: Secondary | ICD-10-CM | POA: Diagnosis not present

## 2016-12-15 DIAGNOSIS — M5416 Radiculopathy, lumbar region: Secondary | ICD-10-CM

## 2016-12-15 MED ORDER — METHYLPREDNISOLONE 4 MG PO TBPK: ORAL_TABLET | ORAL | 0 refills | Status: DC

## 2016-12-15 NOTE — Progress Notes (Signed)
Subjective:    Patient ID: Nicole Douglas, female    DOB: Nov 08, 1945, 71 y.o.   MRN: [id number]  HPI  Nicole Douglas is a 71 yr old female who presents today with chief complaint of low back pain.  Pain radiates into the right hip and radiates down the back of her right leg.  Pain has been present x 2 days. She report hx of intermittent low back pain which has become more constant recently. Of note she does have a history of lumbar surgery in 2010. Denies RLE weakness. Has chronic R great toe numbness since her surgery which is unchanged.  New new numbness, no bowel/bladder incontinence.    Review of Systems    see HPI  Past Medical History:  Diagnosis Date  . Hyperglycemia   . Hyperlipemia   . Hypertension   . Thyroid disease      Social History   Social History  . Marital status: Married    Spouse name: N/A  . Number of children: N/A  . Years of education: N/A   Occupational History  . Not on file.   Social History Main Topics  . Smoking status: Never Smoker  . Smokeless tobacco: Never Used  . Alcohol use No  . Drug use: No  . Sexual activity: Not on file   Other Topics Concern  . Not on file   Social History Narrative   Retired from [employer]   3 grown children (oldest daughter in [place], Daughter local, Son deceased was murdered)   Married   Enjoys reading, crossword, adult coloring, sewing    No pets    Past Surgical History:  Procedure Laterality Date  . ABDOMINAL HYSTERECTOMY    . BACK SURGERY    . BIOPSY BREAST     left breast  . ROTATOR CUFF REPAIR     bilateral    Family History  Problem Relation Age of Onset  . Hypertension Unknown        both sides of family  . Heart disease Unknown   . Cancer Brother        throat  . Heart attack Mother        died at 69  . Heart attack Sister        age 36  . Prostate cancer Father     No Known Allergies  Current Outpatient Prescriptions on File Prior to Visit  Medication Sig Dispense Refill  .  aspirin 81 MG tablet Take 81 mg by mouth daily.    Marland Kitchen atorvastatin (LIPITOR) 10 MG tablet Take 1 tablet (10 mg total) by mouth daily. 90 tablet 1  . Calcium Carbonate (CALTRATE 600 PO) Take by mouth daily.    Marland Kitchen gabapentin (NEURONTIN) 300 MG capsule Take 1 capsule (300 mg total) by mouth at bedtime. 90 capsule 1  . levothyroxine (SYNTHROID, LEVOTHROID) 50 MCG tablet One tablet by mouth once daily except 1/2 tablet by mouth on sundays. 90 tablet 1  . losartan (COZAAR) 50 MG tablet Take 2 tablets (100 mg total) by mouth daily. 180 tablet 1  . metoprolol tartrate (LOPRESSOR) 25 MG tablet Take 1 tablet (25 mg total) by mouth 2 (two) times daily. 180 tablet 1   No current facility-administered medications on file prior to visit.     BP (!) 148/80 (BP Location: Right Arm, Cuff Size: Normal)   Pulse 66   Temp 98.4 F (36.9 C) (Oral)   Resp 18   Ht 5\' 7"  (1.702  m)   Wt 176 lb 9.6 oz (80.1 kg)   SpO2 99%   BMI 27.66 kg/m    Objective:   Physical Exam  Constitutional: She is oriented to person, place, and time. She appears well-developed and well-nourished. No distress.  Eyes: No scleral icterus.  Pulmonary/Chest: Effort normal.  Musculoskeletal: She exhibits no edema.       Thoracic back: She exhibits no tenderness.       Lumbar back: She exhibits no tenderness.  Neurological: She is alert and oriented to person, place, and time.  Reflex Scores:      Patellar reflexes are 2+ on the right side and 2+ on the left side. Bilateral LE strength is 5/5  Skin: Skin is warm and dry.  Psychiatric: She has a normal mood and affect. Her behavior is normal. Thought content normal.          Assessment & Plan:  Low back pain with radiculopathy- rx with medrol dose pak.  Pt is advised as follows:  Call if new/worsening back pain, weakness, numbness. Or if you develop bowel/bladder incontinence.   Let us know if pain is not improved in 1 week.

## 2016-12-15 NOTE — Patient Instructions (Signed)
Please begin medrol dose pak for your back pain. Call if new/worsening back pain, weakness, numbness. Or if you develop bowel/bladder incontinence.   Let us know if pain is not improved in 1 week.

## 2016-12-17 ENCOUNTER — Telehealth: Payer: Self-pay | Admitting: Family

## 2016-12-17 ENCOUNTER — Ambulatory Visit (INDEPENDENT_AMBULATORY_CARE_PROVIDER_SITE_OTHER): Payer: Medicare Other | Admitting: Medical

## 2016-12-17 ENCOUNTER — Ambulatory Visit (HOSPITAL_BASED_OUTPATIENT_CLINIC_OR_DEPARTMENT_OTHER)
Admission: RE | Admit: 2016-12-17 | Discharge: 2016-12-17 | Disposition: A | Discharge status: Home / Self Care | Payer: Medicare Other | Source: Ambulatory Visit | Attending: Medical | Admitting: Medical

## 2016-12-17 ENCOUNTER — Encounter: Payer: Self-pay | Admitting: Medical

## 2016-12-17 ENCOUNTER — Ambulatory Visit: Payer: Self-pay | Admitting: Family Medicine

## 2016-12-17 VITALS — BP 165/70 | HR 68 | Temp 98.1°F | Resp 16 | Ht 67.0 in | Wt 174.0 lb

## 2016-12-17 DIAGNOSIS — M25551 Pain in right hip: Secondary | ICD-10-CM

## 2016-12-17 DIAGNOSIS — G8929 Other chronic pain: Secondary | ICD-10-CM

## 2016-12-17 DIAGNOSIS — M5441 Lumbago with sciatica, right side: Secondary | ICD-10-CM

## 2016-12-17 MED ORDER — LORAZEPAM 0.5 MG PO TABS: ORAL_TABLET | ORAL | 0 refills | Status: DC

## 2016-12-17 MED ORDER — HYDROCODONE-ACETAMINOPHEN 5-325 MG PO TABS: 1.0000 | ORAL_TABLET | Freq: Four times a day (QID) | ORAL | 0 refills | Status: DC | PRN

## 2016-12-17 MED FILL — HYDROCODON-APAP 5-325: 5-325 | 2 days supply | Qty: 16 | Fill #0

## 2016-12-17 MED FILL — LORazepam 0.5 MG TABS: 0.5 | 1 days supply | Qty: 1 | Fill #0

## 2016-12-17 NOTE — Patient Instructions (Addendum)
For severe back and rt hip pain with radiating feature I rx'd norco today and advise continue the prednisone.  Get rt hip xray now and mri tomorrow at 10 am.  If your pain were to get worse with red flag symptoms as discussed then be seen in ED.  For anxiety related to mri rx ativan low dose.  Follow Monday or Tuesday for recheck.  I do want you to check bp at home. I think bp elevation is do to high level of pain. Some decrease of bp even in our office.   Also on review of note last night her o2 sat was 88% when MA checked. This was not brought to my attention by MA. Pt had no sob or wheezing when I talked with her on Saturday (to discuss mri result).. I suspect MA did not hold 02 monitor on her finger long enough. I asked pt if she was short of breath on the phone and she said no. I explained to her the 02 result and that that I think incorrect reading. I did give her my cell phone number and asked her to call me if she has any question or if she gets sob. Pt took number and agreed. Will check her bp and and 02 sat again on Tuesday.  Will get RN to call pt on Monday AM to see how she is.

## 2016-12-17 NOTE — Telephone Encounter (Signed)
Pt called in because she said that she was seen by Melissa in prescribed a pain medication for her hip pain. Pt says that since she is also having some numbness. Pt would like a call back to discuss further options.   Also, due to pt complaining about numbness I did transfer pt to team health for further triaging.      CB: [phone]

## 2016-12-17 NOTE — Progress Notes (Signed)
Subjective:    Patient ID: Nicole Douglas, female    DOB: June 23, 1945, 71 y.o.   MRN: [id number]  HPI  Pt in with severe rt hip pain and leg pain(pain in lumbar spine as well) Pain present now for about one week.. Pt states her toes are numb. Around rt  ankle it feels like band. Pt has some tingling to both toes. She points to rt mid buttock as source of pain. Also rt hip. Some mid lumbar pain but rt hip pain is described as more severe 10/10 with movement. No foot drop, no leg weakness and no incontinence.  Per pcp note has chronic R great toe numbness since her surgery which is unchanged  Pt was given prednisone on Monday. 6 day taper dose of prednisone.  Symptoms not improving.  Pt states pain 8/10 laying down. But 10/10 on changing position.  Pt had surgery 2010.   Bp is up but no neurologic signs or symptom. No chest pain   Review of Systems  Constitutional: Negative for chills, fatigue and fever.  Respiratory: Negative for cough, chest tightness, shortness of breath and wheezing.   Cardiovascular: Negative for chest pain and palpitations.  Gastrointestinal: Negative for abdominal pain, anal bleeding, blood in stool, constipation and vomiting.  Genitourinary: Negative for difficulty urinating, dysuria, flank pain, genital sores, menstrual problem, pelvic pain, vaginal bleeding and vaginal pain.  Musculoskeletal: Positive for back pain. Negative for myalgias and neck pain.  Skin: Negative for rash.  Neurological: Negative for dizziness, seizures and light-headedness.       Some radiating pain down leg from rt si area and hip.  Hematological: Negative for adenopathy. Does not bruise/bleed easily.  Psychiatric/Behavioral: Negative for agitation and confusion.    Past Medical History:  Diagnosis Date  . Hyperglycemia   . Hyperlipemia   . Hypertension   . Thyroid disease      Social History   Social History  . Marital status: Married    Spouse name: N/A  . Number of  children: N/A  . Years of education: N/A   Occupational History  . Not on file.   Social History Main Topics  . Smoking status: Never Smoker  . Smokeless tobacco: Never Used  . Alcohol use No  . Drug use: No  . Sexual activity: Not on file   Other Topics Concern  . Not on file   Social History Narrative   Retired from [employer]   3 grown children (oldest daughter in [place], Daughter local, Son deceased was murdered)   Married   Enjoys reading, crossword, adult coloring, sewing    No pets    Past Surgical History:  Procedure Laterality Date  . ABDOMINAL HYSTERECTOMY    . BACK SURGERY    . BIOPSY BREAST     left breast  . ROTATOR CUFF REPAIR     bilateral    Family History  Problem Relation Age of Onset  . Hypertension Unknown        both sides of family  . Heart disease Unknown   . Cancer Brother        throat  . Heart attack Mother        died at 53  . Heart attack Sister        age 70  . Prostate cancer Father     No Known Allergies  Current Outpatient Prescriptions on File Prior to Visit  Medication Sig Dispense Refill  . aspirin 81 MG tablet Take  81 mg by mouth daily.    . atorvastatin (LIPITOR) 10 MG tablet Marland Kitchenake 1 tablet (10 mg total) by mouth daily. 90 tablet 1  . Calcium Carbonate (CALTRATE 600 PO) Take by mouth daily.    Marland Kitchen. gabapentin (NEURONTIN) 300 MG capsule Take 1 capsule (300 mg total) by mouth at bedtime. 90 capsule 1  . levothyroxine (SYNTHROID, LEVOTHROID) 50 MCG tablet One tablet by mouth once daily except 1/2 tablet by mouth on sundays. 90 tablet 1  . losartan (COZAAR) 50 MG tablet Take 2 tablets (100 mg total) by mouth daily. 180 tablet 1  . methylPREDNISolone (MEDROL DOSEPAK) 4 MG TBPK tablet Take as directed 21 tablet 0  . metoprolol tartrate (LOPRESSOR) 25 MG tablet Take 1 tablet (25 mg total) by mouth 2 (two) times daily. 180 tablet 1  . omeprazole (PRILOSEC) 20 MG capsule Take 1 capsule by mouth daily.     No current  facility-administered medications on file prior to visit.     BP (!) 165/70 Comment: before this was 170/90. bp gradually coming down.  Pulse 68   Temp 98.1 F (36.7 C) (Oral)   Resp 16   Ht 5\' 7"  (1.702 m)   Wt 174 lb (78.9 kg)   SpO2 (!) 88%   BMI 27.25 kg/m       Objective:   Physical Exam  General Appearance- at rest supine does not appear in severe pain. On any change in position looks to be in obvious pain. Pain on walking.    Chest and Lung Exam Auscultation: Breath sounds:-Normal. Clear even and unlabored. Adventitious sounds:- No Adventitious sounds.  Cardiovascular Auscultation:Rythm - Regular, rate and rythm. Heart Sounds -Normal heart sounds.  Abdomen Inspection:-Inspection Normal.  Palpation/Perucssion: Palpation and Percussion of the abdomen reveal- Non Tender, No Rebound tenderness, No rigidity(Guarding) and No Palpable abdominal masses.  Liver:-Normal.  Spleen:- Normal.   Back Mid lumbar spine tenderness to palpation. Pain on straight leg lift. Pain on lateral movements and flexion/extension of the spine.  Lower ext neurologic  L5-S1 sensation intact bilaterally. Normal patellar reflexes bilaterally. No foot drop bilaterally.  General Mental Status- Alert. General Appearance- Not in acute distress.   Skin General: Color- Normal Color. Moisture- Normal Moisture.  Neck Carotid Arteries- Normal color. Moisture- Normal Moisture. No carotid bruits. No JVD.  Chest and Lung Exam Auscultation: Breath Sounds:-Normal.  Cardiovascular Auscultation:Rythm- Regular. Murmurs & Other Heart Sounds:Auscultation of the heart reveals- No Murmurs.  Abdomen Inspection:-Inspeection Normal. Palpation/Percussion:Note:No mass. Palpation and Percussion of the abdomen reveal- Non Tender, Non Distended + BS, no rebound or guarding.    Neurologic Cranial Nerve exam:- CN III-XII intact(No nystagmus), symmetric smile. Strength:- 5/5 equal and symmetric  strength both upper and lower extremities.      Assessment & Plan:  For severe back and rt hip pain with radiating feature I rx'd norco today and advise continue the prednisone.  Get rt hip xray now and mri tomorrow at 10 am.  If your pain were to get worse with red flag symptoms as discussed then be seen in ED.  For anxiety related to mri rx ativan low dose.  Follow Monday or Tuesday for recheck.  I do want you to check bp at home. I think bp elevation is do to high level of pain. Some decrease of bp even in our office.   Tifany Hirsch, Ramon DredgeEdward, PA-C

## 2016-12-17 NOTE — Telephone Encounter (Signed)
Pt was seen by Esperanza RichtersEdward Saguier, PA-C today at 4pm.

## 2016-12-17 NOTE — Telephone Encounter (Signed)
Patient Name: Nicole Douglas  DOB: 02-01-1946    Initial Comment Caller states c/o hip pain, numbness in right leg since yesterday.    Nurse Assessment  Nurse: Sherilyn CooterHenry, RN, Thurmond ButtsWade Date/Time Lamount Cohen(Eastern Time): 12/17/2016 9:55:15 AM  Confirm and document reason for call. If symptomatic, describe symptoms. ---Caller states that she has right hip pain which began Monday and numbness in her right ankle and her toes. She rates her pain as 10 on 0-10 scale. She was seen by the doctor on Wednesday and was prescribed Prednisone. She denies injury to her hip.  Does the patient have any new or worsening symptoms? ---Yes  Will a triage be completed? ---Yes  Related visit to physician within the last 2 weeks? ---Yes  Does the PT have any chronic conditions? (i.e. diabetes, asthma, etc.) ---Yes  List chronic conditions. ---Hypothyroidism, HTN  Is this a behavioral health or substance abuse call? ---No     Guidelines    Guideline Title Affirmed Question Affirmed Notes  Hip Pain Numbness in a leg or foot (i.e., loss of sensation)    Final Disposition User   See Physician within 24 Hours Sherilyn CooterHenry, RN, Thurmond ButtsWade    Comments  Appointment scheduled today with Esperanza RichtersEdward Saguier PA-C today at 4:00pm.   Referrals  REFERRED TO PCP OFFICE   Disagree/Comply: Comply

## 2016-12-18 ENCOUNTER — Telehealth: Payer: Self-pay | Admitting: Medical

## 2016-12-18 ENCOUNTER — Ambulatory Visit (HOSPITAL_BASED_OUTPATIENT_CLINIC_OR_DEPARTMENT_OTHER)
Admission: RE | Admit: 2016-12-18 | Discharge: 2016-12-18 | Disposition: A | Discharge status: Home / Self Care | Payer: Medicare Other | Source: Ambulatory Visit | Attending: Medical | Admitting: Medical

## 2016-12-18 DIAGNOSIS — M5441 Lumbago with sciatica, right side: Secondary | ICD-10-CM

## 2016-12-18 DIAGNOSIS — M4696 Unspecified inflammatory spondylopathy, lumbar region: Secondary | ICD-10-CM | POA: Diagnosis not present

## 2016-12-18 DIAGNOSIS — M5137 Other intervertebral disc degeneration, lumbosacral region: Secondary | ICD-10-CM | POA: Diagnosis not present

## 2016-12-18 DIAGNOSIS — Z9889 Other specified postprocedural states: Secondary | ICD-10-CM | POA: Diagnosis not present

## 2016-12-18 DIAGNOSIS — M5136 Other intervertebral disc degeneration, lumbar region: Secondary | ICD-10-CM | POA: Diagnosis not present

## 2016-12-18 DIAGNOSIS — M5127 Other intervertebral disc displacement, lumbosacral region: Secondary | ICD-10-CM | POA: Diagnosis not present

## 2016-12-18 DIAGNOSIS — G8929 Other chronic pain: Secondary | ICD-10-CM

## 2016-12-18 DIAGNOSIS — M545 Low back pain: Secondary | ICD-10-CM | POA: Diagnosis not present

## 2016-12-18 NOTE — Telephone Encounter (Signed)
Will you call pt on Monday morning see how she is. Please review my note on Friday. See avs and addended portion. Ask if 2 tab of norco every 6 hours helped her pain? Also double check and make sure not having shortness of breath. Please update me on both.

## 2016-12-20 ENCOUNTER — Telehealth: Payer: Self-pay

## 2016-12-20 ENCOUNTER — Telehealth: Payer: Self-pay | Admitting: Family

## 2016-12-20 NOTE — Telephone Encounter (Signed)
See phone notes from today (12/20/16).

## 2016-12-20 NOTE — Telephone Encounter (Signed)
Follow up call made to patient. States as long as she is lying down the Norco helps but once she starts walking around the pain overpowers the medicine. Says she is not having SOB. States she has numbness in her Right leg and  Left ankle has started to cause her pain because she has been using it more.

## 2016-12-20 NOTE — Telephone Encounter (Signed)
Called to remind pt of appt. She would like to have a call back to discuss results of MRI. Pt says that it hurt her to ride in the car. Pt says that she will decide on coming in based on her results.

## 2016-12-20 NOTE — Telephone Encounter (Signed)
Pt.notified

## 2016-12-20 NOTE — Telephone Encounter (Signed)
Pt has appointment tomorrow on Tuesday. If she happens to no show then will need to call pt and see how she is. Also will you check with Victorino DikeJennifer and Silva BandyKristi. See  What is status of her neurosurgeon appointment.

## 2016-12-21 ENCOUNTER — Ambulatory Visit: Payer: Medicare Other | Admitting: Medical

## 2016-12-21 ENCOUNTER — Telehealth: Payer: Self-pay | Admitting: Medical

## 2016-12-21 NOTE — Telephone Encounter (Signed)
Called to follow up patient. Pt stated that she missed her appointment today because "it's just too hard to get in the car to come and have to sit."  Pt stated that her pain is a 12/10 and it's very uncomfortable to move about.  She agreed to follow up with Ramon DredgeEdward on Thursday at 1pm.  Appt scheduled.

## 2016-12-21 NOTE — Telephone Encounter (Signed)
[facility]/ [facility], awaiting return call

## 2016-12-21 NOTE — Telephone Encounter (Signed)
Returning call.

## 2016-12-21 NOTE — Telephone Encounter (Signed)
Left pt a message to call back. 

## 2016-12-21 NOTE — Telephone Encounter (Signed)
Pt missed appointment today. Will you call her and offer her appointment for follow up 1 pm on Thursday or 1 pm on Friday. She has back pain and would like to limit her weight time as sitting hurts her. So please offer her convenient appointment slot. How is her pain now?

## 2016-12-23 ENCOUNTER — Ambulatory Visit: Payer: Medicare Other | Admitting: Medical

## 2016-12-27 ENCOUNTER — Telehealth: Payer: Self-pay | Admitting: Medical

## 2016-12-27 ENCOUNTER — Telehealth: Payer: Self-pay | Admitting: *Deleted

## 2016-12-27 NOTE — Telephone Encounter (Signed)
Noted and agree. Thanks.

## 2016-12-27 NOTE — Telephone Encounter (Signed)
If you would review note [name] made. I thought best to be seen in ED. I think she ran it by me as I have been last one to see her.

## 2016-12-27 NOTE — Telephone Encounter (Signed)
Received call from pt stating she has not heard from Dr York RamJenkin's office yet about referral placed on 12/18/16. States she is in a lot of pain. Has been in the bed since she last saw PA, Saguier. States she is in too much pain to come in to the office. Prednisone dose pack and Norco not helping the pain. States right foot is numb and achey. Has not been able to have a bowel movement since 12/22/16 without taking a laxative. Only urinating 3 times a day and doesn't have urge that she needs to go. Just gets up because she knows she should have to urinate. These have occurred since back pain started. Referral co-ordinator will call Dr Lovell SheehanJenkins office regarding status of referral.  Please advise?   Advised pt per verbal from PCP that she [facility] in the setting of additional symptoms reported above and pain management. Pt voices understanding and is in agreement with plan.

## 2016-12-28 ENCOUNTER — Encounter (HOSPITAL_COMMUNITY): Payer: Self-pay | Admitting: Vascular Surgery

## 2016-12-28 ENCOUNTER — Emergency Department (HOSPITAL_COMMUNITY)
Admission: EM | Admit: 2016-12-28 | Discharge: 2016-12-28 | Disposition: A | Discharge status: Home / Self Care | Payer: Medicare Other | Attending: Emergency Medicine | Admitting: Emergency Medicine

## 2016-12-28 DIAGNOSIS — E079 Disorder of thyroid, unspecified: Secondary | ICD-10-CM | POA: Insufficient documentation

## 2016-12-28 DIAGNOSIS — M1280 Other specific arthropathies, not elsewhere classified, unspecified site: Secondary | ICD-10-CM | POA: Diagnosis not present

## 2016-12-28 DIAGNOSIS — M545 Low back pain: Secondary | ICD-10-CM | POA: Diagnosis present

## 2016-12-28 DIAGNOSIS — M5126 Other intervertebral disc displacement, lumbar region: Secondary | ICD-10-CM | POA: Diagnosis not present

## 2016-12-28 DIAGNOSIS — Z7982 Long term (current) use of aspirin: Secondary | ICD-10-CM | POA: Diagnosis not present

## 2016-12-28 DIAGNOSIS — M5187 Other intervertebral disc disorders, lumbosacral region: Secondary | ICD-10-CM | POA: Diagnosis not present

## 2016-12-28 DIAGNOSIS — M5431 Sciatica, right side: Secondary | ICD-10-CM | POA: Insufficient documentation

## 2016-12-28 DIAGNOSIS — M79604 Pain in right leg: Secondary | ICD-10-CM | POA: Diagnosis not present

## 2016-12-28 DIAGNOSIS — M4696 Unspecified inflammatory spondylopathy, lumbar region: Secondary | ICD-10-CM | POA: Diagnosis not present

## 2016-12-28 DIAGNOSIS — I10 Essential (primary) hypertension: Secondary | ICD-10-CM | POA: Diagnosis not present

## 2016-12-28 DIAGNOSIS — M47819 Spondylosis without myelopathy or radiculopathy, site unspecified: Secondary | ICD-10-CM

## 2016-12-28 MED ORDER — KETOROLAC TROMETHAMINE 60 MG/2ML IM SOLN
60.0000 mg | Freq: Once | INTRAMUSCULAR | Status: AC
  Administered 2016-12-28: 60 mg via INTRAMUSCULAR
  Filled 2016-12-28: qty 2

## 2016-12-28 MED ORDER — PREDNISONE 10 MG PO TABS: 40.0000 mg | ORAL_TABLET | Freq: Every day | ORAL | 0 refills | Status: AC

## 2016-12-28 MED ORDER — CYCLOBENZAPRINE HCL 10 MG PO TABS
10.0000 mg | ORAL_TABLET | Freq: Once | ORAL | Status: AC
  Administered 2016-12-28: 10 mg via ORAL
  Filled 2016-12-28: qty 1

## 2016-12-28 MED ORDER — CYCLOBENZAPRINE HCL 10 MG PO TABS: 10.0000 mg | ORAL_TABLET | Freq: Two times a day (BID) | ORAL | 0 refills | Status: DC | PRN

## 2016-12-28 MED ORDER — OXYCODONE HCL 5 MG PO TABS
5.0000 mg | ORAL_TABLET | Freq: Once | ORAL | Status: AC
  Administered 2016-12-28: 5 mg via ORAL
  Filled 2016-12-28: qty 1

## 2016-12-28 MED ORDER — ACETAMINOPHEN 500 MG PO TABS
1000.0000 mg | ORAL_TABLET | Freq: Once | ORAL | Status: AC
  Administered 2016-12-28: 1000 mg via ORAL
  Filled 2016-12-28: qty 2

## 2016-12-28 MED ORDER — LIDOCAINE 5 % EX PTCH: 1.0000 | MEDICATED_PATCH | CUTANEOUS | 0 refills | Status: DC

## 2016-12-28 NOTE — ED Notes (Signed)
ED Provider at bedside. 

## 2016-12-28 NOTE — ED Triage Notes (Signed)
Pt reports to the ED for eval of low back/right hip pain that radiates down her right leg. She states that she has had this pain since Father's day. She had back surgery in 2011 and she has been trying to get in to see Dr. Lovell SheehanJenkins. She had an MRI/X-ray performed at her PCPs office but has not heard anything from the tests. Reports some associated right sided tingling. She denies any numbness or bowel or bladder incontinence.

## 2016-12-28 NOTE — Discharge Instructions (Signed)
You may increase your home gabapentin to 300mg  3 times daily over the course of 2 days.  I recommend ibuprofen 400mg  (with food or beverage) every 4 hours and tylenol 1000mg  every 6 hours.  This is the maximum dose of tylenol, so be certain you are not taking tylenol from any other sources.  (over the counter cold medications or prescribed pain medications that contain tylenol)

## 2016-12-28 NOTE — ED Provider Notes (Signed)
[facility]-EMERGENCY DEPT Provider Note   CSN: [id number] Arrival date & time: 12/28/16  1652     History   Chief Complaint Chief Complaint  Patient presents with  . Back Pain  . Leg Pain    HPI Nicole Douglas is a 71 y.o. female.  HPI   3 weeks of back pain radiating down the right leg. No falls or trauma.  Feels like a burning, sharp, aching pain.  Laying down is better, worse with sitting, standing and walking.  Has been in bed since 6/25.  Has only been getting up to go to the bathroom.  Saw PCP, took prednisone, then took hydrocodone and pain increased.  Now taking tylenol.  Pain was a 12, eased up some today, and started again when laying in bed. Nonstop pain.  Numbness in bilateral toes, right worse.  Putting a lot of pressure on left leg but not having weakness.  No loss of control of bowels or bladder.  No fevers. Steroid use was 6/29. Had MRI done as outpatient but has not heard results.   Past Medical History:  Diagnosis Date  . Hyperglycemia   . Hyperlipemia   . Hypertension   . Thyroid disease     Patient Active Problem List   Diagnosis Date Noted  . Hyperglycemia   . Post menopausal syndrome 04/13/2016  . Hypothyroidism 10/01/2014  . GERD (gastroesophageal reflux disease) 03/27/2014  . Pelvic pressure in female 03/27/2014  . Physical exam 03/27/2014  . Tinnitus 10/31/2013  . Right hand pain 10/26/2013  . Dizziness 10/19/2013  . HTN (hypertension) 09/20/2013  . Hyperlipidemia 09/20/2013  . Degenerative disc disease, lumbar 09/20/2013  . Lumbar strain 09/15/2013  . PARESTHESIA 03/10/2007  . PELVIC  PAIN 03/10/2007  . PALPITATIONS, HX OF 03/10/2007  . COLONOSCOPY, HX OF 03/10/2007    Past Surgical History:  Procedure Laterality Date  . ABDOMINAL HYSTERECTOMY    . BACK SURGERY    . BIOPSY BREAST     left breast  . ROTATOR CUFF REPAIR     bilateral    OB History    No data available       Home Medications    Prior to Admission medications     Medication Sig Start Date End Date Taking? Authorizing Provider  aspirin 81 MG tablet Take 81 mg by mouth daily.    [provider]  atorvastatin (LIPITOR) 10 MG tablet Take 1 tablet (10 mg total) by mouth daily. 06/25/16   Sandford Craze, NP  Calcium Carbonate (CALTRATE 600 PO) Take by mouth daily.    [provider]  cyclobenzaprine (FLEXERIL) 10 MG tablet Take 1 tablet (10 mg total) by mouth 2 (two) times daily as needed for muscle spasms. 12/28/16   Alvira Monday, MD  gabapentin (NEURONTIN) 300 MG capsule Take 1 capsule (300 mg total) by mouth at bedtime. 10/12/16   Sandford Craze, NP  HYDROcodone-acetaminophen (NORCO) 5-325 MG tablet Take 1-2 tablets by mouth every 6 (six) hours as needed for moderate pain or severe pain. 12/17/16   Saguier, Ramon Dredge, PA-C  levothyroxine (SYNTHROID, LEVOTHROID) 50 MCG tablet One tablet by mouth once daily except 1/2 tablet by mouth on sundays. 06/25/16   Sandford Craze, NP  lidocaine (LIDODERM) 5 % Place 1 patch onto the skin daily. Remove & Discard patch within 12 hours or as directed by MD 12/28/16   Alvira Monday, MD  LORazepam (ATIVAN) 0.5 MG tablet 1 tab 1 hour prior to mri for anxiety 12/17/16  Saguier, Ramon DredgeEdward, PA-C  losartan (COZAAR) 50 MG tablet Take 2 tablets (100 mg total) by mouth daily. 06/25/16   Sandford Craze'Sullivan, Melissa, NP  methylPREDNISolone (MEDROL DOSEPAK) 4 MG TBPK tablet Take as directed 12/15/16   Sandford Craze'Sullivan, Melissa, NP  metoprolol tartrate (LOPRESSOR) 25 MG tablet Take 1 tablet (25 mg total) by mouth 2 (two) times daily. 06/25/16   Sandford Craze'Sullivan, Melissa, NP  omeprazole (PRILOSEC) 20 MG capsule Take 1 capsule by mouth daily. 12/15/16   [provider]  predniSONE (DELTASONE) 10 MG tablet Take 4 tablets (40 mg total) by mouth daily. 12/28/16 01/01/17  Alvira MondaySchlossman, Keirstyn Aydt, MD    Family History Family History  Problem Relation Age of Onset  . Hypertension Unknown        both sides of family  . Heart disease Unknown    . Cancer Brother        throat  . Heart attack Mother        died at 3572  . Heart attack Sister        age 72  . Prostate cancer Father     Social History Social History  Substance Use Topics  . Smoking status: Never Smoker  . Smokeless tobacco: Never Used  . Alcohol use No     Allergies   Patient has no known allergies.   Review of Systems Review of Systems  Constitutional: Negative for fever.  HENT: Negative for sore throat.   Eyes: Negative for visual disturbance.  Respiratory: Negative for cough and shortness of breath.   Cardiovascular: Negative for chest pain.  Gastrointestinal: Negative for abdominal pain and vomiting.  Genitourinary: Negative for difficulty urinating and dysuria.  Musculoskeletal: Positive for back pain.  Skin: Negative for rash.  Neurological: Positive for numbness (bilateral toes). Negative for syncope and headaches.     Physical Exam Updated Vital Signs BP (!) 172/78   Pulse 61   Temp 98.4 F (36.9 C) (Oral)   Resp 16   SpO2 100%   Physical Exam  Constitutional: She is oriented to person, place, and time. She appears well-developed and well-nourished. No distress.  HENT:  Head: Normocephalic and atraumatic.  Eyes: Conjunctivae and EOM are normal.  Neck: Normal range of motion.  Cardiovascular: Normal rate, regular rhythm, normal heart sounds and intact distal pulses.  Exam reveals no gallop and no friction rub.   No murmur heard. Pulmonary/Chest: Effort normal and breath sounds normal. No respiratory distress. She has no wheezes. She has no rales.  Abdominal: Soft. She exhibits no distension. There is no tenderness. There is no guarding.  Musculoskeletal: She exhibits no edema or tenderness.       Lumbar back: She exhibits no tenderness and no bony tenderness.  Tenderness sciatic notch  Neurological: She is alert and oriented to person, place, and time. She has normal strength. A sensory deficit (lateral foot bilateral  parasthesia/altered sensation, very tip of toes bilaterally, no other nmbness) is present. GCS eye subscore is 4. GCS verbal subscore is 5. GCS motor subscore is 6.  Skin: Skin is warm and dry. No rash noted. She is not diaphoretic. No erythema.  Nursing note and vitals reviewed.    ED Treatments / Results  Labs (all labs ordered are listed, but only abnormal results are displayed) Labs Reviewed - No data to display  EKG  EKG Interpretation None       Radiology No results found.  Procedures Procedures (including critical care time)  Medications Ordered in ED Medications  ketorolac (TORADOL)  injection 60 mg (60 mg Intramuscular Given 12/28/16 2213)  oxyCODONE (Oxy IR/ROXICODONE) immediate release tablet 5 mg (5 mg Oral Given 12/28/16 2213)  cyclobenzaprine (FLEXERIL) tablet 10 mg (10 mg Oral Given 12/28/16 2213)  acetaminophen (TYLENOL) tablet 1,000 mg (1,000 mg Oral Given 12/28/16 2213)     Initial Impression / Assessment and Plan / ED Course  I have reviewed the triage vital signs and the nursing notes.  Pertinent labs & imaging results that were available during my care of the patient were reviewed by me and considered in my medical decision making (see chart for details).    71yo female htn, hlpd, back surgery, presents with concern for increased back pain over the last 3 weeks. Patient has a normal neurologic exam with exception of foot numbness. and denies any urinary retention or overflow incontinence, stool incontinence, saddle anesthesia, fever, IV drug use, trauma, and have low suspicion suspicion for cauda equina, fracture, epidural abscess, or vertebral osteomyelitis. MRI had been completed 6/30 which shows disc herniation and facet arthropathy with foraminal narrowing worse on right, likely etiology of symptoms.  Pt also with pain beginning at scitic notch.  Gave rx for lidocaine patch, flexeril, prednisone x4 days, recommend increase in gabapentin, ibuprofen, tylenol,  exercises and PCP and NSU follow up.  Final Clinical Impressions(s) / ED Diagnoses   Final diagnoses:  Sciatica of right side  Lumbar disc herniation  Facet arthropathy (HCC)    New Prescriptions Discharge Medication List as of 12/28/2016 10:11 PM    START taking these medications   Details  cyclobenzaprine (FLEXERIL) 10 MG tablet Take 1 tablet (10 mg total) by mouth 2 (two) times daily as needed for muscle spasms., Starting Tue 12/28/2016, Print    lidocaine (LIDODERM) 5 % Place 1 patch onto the skin daily. Remove & Discard patch within 12 hours or as directed by MD, Starting Tue 12/28/2016, Print    predniSONE (DELTASONE) 10 MG tablet Take 4 tablets (40 mg total) by mouth daily., Starting Tue 12/28/2016, Until Sat 01/01/2017, Print         Alvira Monday, MD 12/29/16 1250

## 2016-12-29 MED FILL — CYCLOBENZAPRINE 10 MG TABLE: 10 | 10 days supply | Qty: 20 | Fill #0

## 2016-12-29 MED FILL — predniSONE 10 MG TABS: 10 | 4 days supply | Qty: 16 | Fill #0

## 2017-01-05 ENCOUNTER — Telehealth: Payer: Self-pay | Admitting: Family

## 2017-01-05 NOTE — Telephone Encounter (Signed)
Notified pt and she voices understanding. She has already purchased the [employer] Pas and is using that.

## 2017-01-05 NOTE — Telephone Encounter (Signed)
She can purchase otc Salon pas patch instead of lidocaine. I left message with Dr.Jenkin's secretary requesting a sooner appointment with either Dr. Lovell SheehanJenkins or one of his partners. If she has new/worsening weakness or bowel/bladder incontinence she should go to the ER.

## 2017-01-05 NOTE — Telephone Encounter (Signed)
Melissa-- please review visit with Nicole DredgeEdward and ER and advise?  Also received a fax from Medicare that they denied coverage for Lidocaine patch because they needed more information.

## 2017-01-05 NOTE — Telephone Encounter (Signed)
Relation to NF:AOZHpt:self Call back number:[phone]   Reason for call:  Patient states Dr. Lovell SheehanJenkins orthopedic cant see her until 01/18/17 for hip/leg pain feet are numb,patient would like to know if there's another orthopedic that can see her prior as soon as possible.

## 2017-01-11 ENCOUNTER — Ambulatory Visit: Payer: Medicare Other | Admitting: Family

## 2017-01-18 DIAGNOSIS — M4316 Spondylolisthesis, lumbar region: Secondary | ICD-10-CM | POA: Diagnosis not present

## 2017-01-18 DIAGNOSIS — I10 Essential (primary) hypertension: Secondary | ICD-10-CM | POA: Diagnosis not present

## 2017-02-02 ENCOUNTER — Other Ambulatory Visit: Payer: Self-pay | Admitting: Family

## 2017-02-04 ENCOUNTER — Telehealth: Payer: Self-pay | Admitting: Family

## 2017-02-04 NOTE — Telephone Encounter (Signed)
Pt called in because there is a recall on levothyroxine.  Pt would like to know if she should continue to take medication?    Please call back to advise.   CB: [phone]

## 2017-02-04 NOTE — Telephone Encounter (Signed)
Per verbal from PCP, she is not aware of any recall at this time and we advised pt to contact pharmacy to see if her recent prescription is part of any current recall.

## 2017-05-26 ENCOUNTER — Telehealth: Payer: Self-pay | Admitting: Family

## 2017-05-26 DIAGNOSIS — Z1231 Encounter for screening mammogram for malignant neoplasm of breast: Secondary | ICD-10-CM

## 2017-05-26 NOTE — Telephone Encounter (Signed)
Mammogram orders placed.

## 2017-05-26 NOTE — Telephone Encounter (Signed)
Copied from CRM [phone]. Topic: Quick Communication - See Telephone Encounter >> May 26, 2017  9:14 AM Elliot GaultBell, Tiffany M wrote: CRM for notification. See Telephone encounter for:   05/26/17.  Relation to pt: self  Call back number:[phone]   Reason for call:   Patient scheduled follow up for 06/03/2017 and patient would like yearly mamo orders placed, patient would like to have mamo conducted on 06/03/17, please advise regarding orders

## 2017-06-03 ENCOUNTER — Ambulatory Visit (HOSPITAL_BASED_OUTPATIENT_CLINIC_OR_DEPARTMENT_OTHER)
Admission: RE | Admit: 2017-06-03 | Discharge: 2017-06-03 | Disposition: A | Discharge status: Home / Self Care | Payer: Medicare Other | Source: Ambulatory Visit | Attending: Family | Admitting: Family

## 2017-06-03 ENCOUNTER — Encounter: Payer: Self-pay | Admitting: Family

## 2017-06-03 ENCOUNTER — Ambulatory Visit (INDEPENDENT_AMBULATORY_CARE_PROVIDER_SITE_OTHER): Payer: Medicare Other | Admitting: Family

## 2017-06-03 ENCOUNTER — Encounter (HOSPITAL_BASED_OUTPATIENT_CLINIC_OR_DEPARTMENT_OTHER): Payer: Self-pay

## 2017-06-03 VITALS — BP 175/66 | HR 55 | Temp 98.1°F | Resp 16 | Ht 66.5 in | Wt 180.0 lb

## 2017-06-03 DIAGNOSIS — Z23 Encounter for immunization: Secondary | ICD-10-CM

## 2017-06-03 DIAGNOSIS — E785 Hyperlipidemia, unspecified: Secondary | ICD-10-CM

## 2017-06-03 DIAGNOSIS — E039 Hypothyroidism, unspecified: Secondary | ICD-10-CM | POA: Diagnosis not present

## 2017-06-03 DIAGNOSIS — J029 Acute pharyngitis, unspecified: Secondary | ICD-10-CM

## 2017-06-03 DIAGNOSIS — Z1231 Encounter for screening mammogram for malignant neoplasm of breast: Secondary | ICD-10-CM | POA: Diagnosis not present

## 2017-06-03 DIAGNOSIS — I10 Essential (primary) hypertension: Secondary | ICD-10-CM | POA: Diagnosis not present

## 2017-06-03 LAB — BASIC METABOLIC PANEL
BUN: 13 mg/dL (ref 6–23)
CALCIUM: 9.3 mg/dL (ref 8.4–10.5)
CO2: 32 meq/L (ref 19–32)
Chloride: 106 mEq/L (ref 96–112)
Creatinine, Ser: 0.8 mg/dL (ref 0.40–1.20)
GFR: 90.45 mL/min (ref >60.00)
GLUCOSE: 124 mg/dL — AB (ref 70–99)
Potassium: 4.4 mEq/L (ref 3.5–5.1)
Sodium: 145 mEq/L (ref 135–145)

## 2017-06-03 LAB — TSH: TSH: 1.86 u[IU]/mL (ref 0.35–4.50)

## 2017-06-03 MED ORDER — LEVOTHYROXINE SODIUM 50 MCG PO TABS: ORAL_TABLET | ORAL | 1 refills | Status: DC

## 2017-06-03 MED ORDER — OMEPRAZOLE 20 MG PO CPDR: 20.0000 mg | DELAYED_RELEASE_CAPSULE | Freq: Every day | ORAL | 1 refills | Status: DC

## 2017-06-03 MED ORDER — AMLODIPINE BESYLATE 5 MG PO TABS: 5.0000 mg | ORAL_TABLET | Freq: Every day | ORAL | 3 refills | Status: DC

## 2017-06-03 MED ORDER — ATORVASTATIN CALCIUM 10 MG PO TABS: 10.0000 mg | ORAL_TABLET | Freq: Every day | ORAL | 1 refills | Status: DC

## 2017-06-03 MED ORDER — LOSARTAN POTASSIUM 50 MG PO TABS: 100.0000 mg | ORAL_TABLET | Freq: Every day | ORAL | 1 refills | Status: DC

## 2017-06-03 MED ORDER — METOPROLOL TARTRATE 25 MG PO TABS: 25.0000 mg | ORAL_TABLET | Freq: Two times a day (BID) | ORAL | 1 refills | Status: DC

## 2017-06-03 MED ORDER — GABAPENTIN 300 MG PO CAPS: 300.0000 mg | ORAL_CAPSULE | Freq: Every day | ORAL | 1 refills | Status: DC

## 2017-06-03 MED FILL — AMLODIPINE BESYLATE 5 MG TA: 5 | 30 days supply | Qty: 30 | Fill #0

## 2017-06-03 NOTE — Patient Instructions (Signed)
Please complete lab work prior to leaving.  Add amlodipine 5mg once daily for blood pressure.  

## 2017-06-03 NOTE — Progress Notes (Signed)
Subjective:    Patient ID: Nicole Douglas, female    DOB: 08-23-1945, 71 y.o.   MRN: [id number]  HPI   Reports that she woke up with a sore throat this AM.    HTN- maintained on losartan and metoprolol.   BP Readings from Last 3 Encounters:  06/03/17 (!) 175/66  12/28/16 (!) 172/78  12/17/16 (!) 165/70   Hyperlipidemia- denies myalgia.  Lab Results  Component Value Date   CHOL 156 10/12/2016   HDL 54.60 10/12/2016   LDLCALC 85 10/12/2016   TRIG 82.0 10/12/2016   CHOLHDL 3 10/12/2016   Hypothyroid-  Lab Results  Component Value Date   TSH 2.22 10/12/2016     Review of Systems Past Medical History:  Diagnosis Date  . Hyperglycemia   . Hyperlipemia   . Hypertension   . Thyroid disease      Social History   Socioeconomic History  . Marital status: Married    Spouse name: Not on file  . Number of children: Not on file  . Years of education: Not on file  . Highest education level: Not on file  Social Needs  . Financial resource strain: Not on file  . Food insecurity - worry: Not on file  . Food insecurity - inability: Not on file  . Transportation needs - medical: Not on file  . Transportation needs - non-medical: Not on file  Occupational History  . Not on file  Tobacco Use  . Smoking status: Never Smoker  . Smokeless tobacco: Never Used  Substance and Sexual Activity  . Alcohol use: No  . Drug use: No  . Sexual activity: Not on file  Other Topics Concern  . Not on file  Social History Narrative   Retired from [employer]   3 grown children (oldest daughter in [place], Daughter local, Son deceased was murdered)   Married   Enjoys reading, crossword, adult coloring, sewing    No pets    Past Surgical History:  Procedure Laterality Date  . ABDOMINAL HYSTERECTOMY    . BACK SURGERY    . BIOPSY BREAST     left breast  . BREAST BIOPSY Left    benign   . ROTATOR CUFF REPAIR     bilateral    Family History  Problem Relation Age of Onset  .  Hypertension Unknown        both sides of family  . Heart disease Unknown   . Cancer Brother        throat  . Heart attack Mother        died at 8872  . Heart attack Sister        age 654  . Prostate cancer Father     No Known Allergies  Current Outpatient Medications on File Prior to Visit  Medication Sig Dispense Refill  . aspirin 81 MG tablet Take 81 mg by mouth daily.    . Calcium Carbonate (CALTRATE 600 PO) Take by mouth daily.     No current facility-administered medications on file prior to visit.     BP (!) 175/66 (BP Location: Left Arm, Patient Position: Sitting, Cuff Size: Large)   Pulse (!) 55   Temp 98.1 F (36.7 C) (Oral)   Resp 16   Ht 5' 6.5" (1.689 m)   Wt 180 lb (81.6 kg)   SpO2 96%   BMI 28.62 kg/m       Objective:   Physical Exam  Constitutional: She is oriented  to person, place, and time. She appears well-developed and well-nourished.  HENT:  Head: Normocephalic and atraumatic.  Right Ear: Tympanic membrane and ear canal normal.  Left Ear: Tympanic membrane and ear canal normal.  Mouth/Throat: No oropharyngeal exudate, posterior oropharyngeal edema or posterior oropharyngeal erythema.  Cardiovascular: Normal rate, regular rhythm and normal heart sounds.  No murmur heard. Pulmonary/Chest: Effort normal and breath sounds normal. No respiratory distress. She has no wheezes.  Neurological: She is alert and oriented to person, place, and time.  Psychiatric: She has a normal mood and affect. Her behavior is normal. Judgment and thought content normal.          Assessment & Plan:  Sore throat- normal exam, ? Viral etiology.  Advised patient to call if new or worsening symptoms.  Hypertension-uncontrolled.  We will add amlodipine 5 mg once daily. BP Readings from Last 3 Encounters:  06/03/17 (!) 175/66  12/28/16 (!) 172/78  12/17/16 (!) 165/70   Hyperlipidemia-tolerating statin without difficulty.  Continue same.  Hypothyroidism-stable on  current dose of Synthroid.  Obtain follow-up TSH

## 2017-06-03 NOTE — Addendum Note (Signed)
Addended by: Mervin KungFERGERSON, Dineen Conradt A on: 06/03/2017 01:15 PM   Modules accepted: Orders

## 2017-06-06 ENCOUNTER — Encounter: Payer: Self-pay | Admitting: Family

## 2017-06-07 ENCOUNTER — Telehealth: Payer: Self-pay | Admitting: Family

## 2017-06-07 NOTE — Telephone Encounter (Signed)
Pt. Called to report she has a cold and also she feels like the amlodipine is making her feel weak and jittery. Wants to know if she should continue. BP today 148/66 . Will forward to provider.

## 2017-06-07 NOTE — Telephone Encounter (Signed)
Copied from CRM [id number]. Topic: Quick Communication - See Telephone Encounter >> Jun 07, 2017 11:29 AM Everardo PacificMoton, Berdell Nevitt, NT wrote: CRM for notification. See Telephone encounter for: Patient has a cold and wanted to know if it would be ok if she stopped taking her Amlodipine because it is making her feel weak. If someone could please give her a call about this at [phone]  06/07/17.

## 2017-06-07 NOTE — Telephone Encounter (Signed)
Patient reports she started Amlodipine Friday, her symptoms started Thursday night with Sore throat. She was seen Friday but per patient Melissa said throat looked ok at that time. Symptoms have been getting worst now she has cough.  Patient complains of weakness and feeling jittery this symptoms started on Saturday night or Sunday and she is not s

## 2017-06-07 NOTE — Telephone Encounter (Signed)
Feeling jittery this symptoms started "either Saturday night or Sunday and is not constant" she also said she is not sure is from the medication.  Patient was scheduled to see Esperanza RichtersEdward Saguier tomorrow at 10 am.

## 2017-06-08 ENCOUNTER — Telehealth: Payer: Self-pay | Admitting: *Deleted

## 2017-06-08 ENCOUNTER — Ambulatory Visit: Payer: Medicare Other | Admitting: Medical

## 2017-06-08 NOTE — Telephone Encounter (Signed)
-----   Message from Sandford CrazeMelissa O'Sullivan, NP sent at 06/07/2017  9:23 PM EST ----- I sent 2  lab letters to you on her by accident.  Sorry.

## 2017-06-08 NOTE — Telephone Encounter (Signed)
Mailed most recent letter to pt with note to disregard any previous letters and accept this as the final result and apologized for any confusion we may have caused her.

## 2017-07-01 ENCOUNTER — Encounter: Payer: Self-pay | Admitting: Family

## 2017-07-01 ENCOUNTER — Ambulatory Visit: Payer: Medicare Other | Admitting: Family

## 2017-07-01 ENCOUNTER — Ambulatory Visit (INDEPENDENT_AMBULATORY_CARE_PROVIDER_SITE_OTHER): Payer: Medicare Other | Admitting: Family

## 2017-07-01 VITALS — BP 140/67 | HR 60 | Temp 97.9°F | Resp 18 | Ht 66.5 in | Wt 180.4 lb

## 2017-07-01 DIAGNOSIS — R739 Hyperglycemia, unspecified: Secondary | ICD-10-CM | POA: Diagnosis not present

## 2017-07-01 DIAGNOSIS — I10 Essential (primary) hypertension: Secondary | ICD-10-CM

## 2017-07-01 DIAGNOSIS — K219 Gastro-esophageal reflux disease without esophagitis: Secondary | ICD-10-CM

## 2017-07-01 DIAGNOSIS — E785 Hyperlipidemia, unspecified: Secondary | ICD-10-CM | POA: Diagnosis not present

## 2017-07-01 DIAGNOSIS — E039 Hypothyroidism, unspecified: Secondary | ICD-10-CM | POA: Diagnosis not present

## 2017-07-01 MED ORDER — AMLODIPINE BESYLATE 5 MG PO TABS: 5.0000 mg | ORAL_TABLET | Freq: Every day | ORAL | 1 refills | Status: DC

## 2017-07-01 MED ORDER — AMLODIPINE BESYLATE 5 MG PO TABS: 5.0000 mg | ORAL_TABLET | Freq: Every day | ORAL | 0 refills | Status: DC

## 2017-07-01 MED FILL — AMLODIPINE BESYLATE 5 MG TA: 5 | 30 days supply | Qty: 30 | Fill #1

## 2017-07-01 NOTE — Patient Instructions (Signed)
Diabetes Mellitus and Nutrition When you have diabetes (diabetes mellitus), it is very important to have healthy eating habits because your blood sugar (glucose) levels are greatly affected by what you eat and drink. Eating healthy foods in the appropriate amounts, at about the same times every day, can help you:  Control your blood glucose.  Lower your risk of heart disease.  Improve your blood pressure.  Reach or maintain a healthy weight.  Every person with diabetes is different, and each person has different needs for a meal plan. Your health care provider may recommend that you work with a diet and nutrition specialist (dietitian) to make a meal plan that is best for you. Your meal plan may vary depending on factors such as:  The calories you need.  The medicines you take.  Your weight.  Your blood glucose, blood pressure, and cholesterol levels.  Your activity level.  Other health conditions you have, such as heart or kidney disease.  How do carbohydrates affect me? Carbohydrates affect your blood glucose level more than any other type of food. Eating carbohydrates naturally increases the amount of glucose in your blood. Carbohydrate counting is a method for keeping track of how many carbohydrates you eat. Counting carbohydrates is important to keep your blood glucose at a healthy level, especially if you use insulin or take certain oral diabetes medicines. It is important to know how many carbohydrates you can safely have in each meal. This is different for every person. Your dietitian can help you calculate how many carbohydrates you should have at each meal and for snack. Foods that contain carbohydrates include:  Bread, cereal, rice, pasta, and crackers.  Potatoes and corn.  Peas, beans, and lentils.  Milk and yogurt.  Fruit and juice.  Desserts, such as cakes, cookies, ice cream, and candy.  How does alcohol affect me? Alcohol can cause a sudden decrease in blood  glucose (hypoglycemia), especially if you use insulin or take certain oral diabetes medicines. Hypoglycemia can be a life-threatening condition. Symptoms of hypoglycemia (sleepiness, dizziness, and confusion) are similar to symptoms of having too much alcohol. If your health care provider says that alcohol is safe for you, follow these guidelines:  Limit alcohol intake to no more than 1 drink per day for nonpregnant women and 2 drinks per day for men. One drink equals 12 oz of beer, 5 oz of wine, or 1 oz of hard liquor.  Do not drink on an empty stomach.  Keep yourself hydrated with water, diet soda, or unsweetened iced tea.  Keep in mind that regular soda, juice, and other mixers may contain a lot of sugar and must be counted as carbohydrates.  What are tips for following this plan? Reading food labels  Start by checking the serving size on the label. The amount of calories, carbohydrates, fats, and other nutrients listed on the label are based on one serving of the food. Many foods contain more than one serving per package.  Check the total grams (g) of carbohydrates in one serving. You can calculate the number of servings of carbohydrates in one serving by dividing the total carbohydrates by 15. For example, if a food has 30 g of total carbohydrates, it would be equal to 2 servings of carbohydrates.  Check the number of grams (g) of saturated and trans fats in one serving. Choose foods that have low or no amount of these fats.  Check the number of milligrams (mg) of sodium in one serving. Most people   should limit total sodium intake to less than 2,300 mg per day.  Always check the nutrition information of foods labeled as "low-fat" or "nonfat". These foods may be higher in added sugar or refined carbohydrates and should be avoided.  Talk to your dietitian to identify your daily goals for nutrients listed on the label. Shopping  Avoid buying canned, premade, or processed foods. These  foods tend to be high in fat, sodium, and added sugar.  Shop around the outside edge of the grocery store. This includes fresh fruits and vegetables, bulk grains, fresh meats, and fresh dairy. Cooking  Use low-heat cooking methods, such as baking, instead of high-heat cooking methods like deep frying.  Cook using healthy oils, such as olive, canola, or sunflower oil.  Avoid cooking with butter, cream, or high-fat meats. Meal planning  Eat meals and snacks regularly, preferably at the same times every day. Avoid going long periods of time without eating.  Eat foods high in fiber, such as fresh fruits, vegetables, beans, and whole grains. Talk to your dietitian about how many servings of carbohydrates you can eat at each meal.  Eat 4-6 ounces of lean protein each day, such as lean meat, chicken, fish, eggs, or tofu. 1 ounce is equal to 1 ounce of meat, chicken, or fish, 1 egg, or 1/4 cup of tofu.  Eat some foods each day that contain healthy fats, such as avocado, nuts, seeds, and fish. Lifestyle   Check your blood glucose regularly.  Exercise at least 30 minutes 5 or more days each week, or as told by your health care provider.  Take medicines as told by your health care provider.  Do not use any products that contain nicotine or tobacco, such as cigarettes and e-cigarettes. If you need help quitting, ask your health care provider.  Work with a counselor or diabetes educator to identify strategies to manage stress and any emotional and social challenges. What are some questions to ask my health care provider?  Do I need to meet with a diabetes educator?  Do I need to meet with a dietitian?  What number can I call if I have questions?  When are the best times to check my blood glucose? Where to find more information:  American Diabetes Association: diabetes.org/food-and-fitness/food  Academy of Nutrition and Dietetics:  www.eatright.org/resources/health/diseases-and-conditions/diabetes  National Institute of Diabetes and Digestive and Kidney Diseases (NIH): www.niddk.nih.gov/health-information/diabetes/overview/diet-eating-physical-activity Summary  A healthy meal plan will help you control your blood glucose and maintain a healthy lifestyle.  Working with a diet and nutrition specialist (dietitian) can help you make a meal plan that is best for you.  Keep in mind that carbohydrates and alcohol have immediate effects on your blood glucose levels. It is important to count carbohydrates and to use alcohol carefully. This information is not intended to replace advice given to you by your health care provider. Make sure you discuss any questions you have with your health care provider. Document Released: 03/04/2005 Document Revised: 07/12/2016 Document Reviewed: 07/12/2016 Elsevier Interactive Patient Education  2018 Elsevier Inc.  

## 2017-07-01 NOTE — Progress Notes (Signed)
Subjective:    Patient ID: Nicole Douglas, female    DOB: 1946/01/14, 72 y.o.   MRN: [id number]  HPI  Ms. Nicole Douglas is a 72 yr old female who presents today for follow up.  1) HTN- maintained on losartan, metoprolol and amlodipine which was added last visit.  BP Readings from Last 3 Encounters:  07/01/17 140/67  06/03/17 (!) 175/66  12/28/16 (!) 172/78   2) Hypothyroid- maintained on synthroid.  Lab Results  Component Value Date   TSH 1.86 06/03/2017   3) GERD- maintained on prilosec 20mg  once daily. Reports that her symptoms are well controlled.  She uses every other day and if she waits longer symptoms return.   4) Hyperlipidemia- Maintained on lipitor, denies myalgia.  Lab Results  Component Value Date   CHOL 156 10/12/2016   HDL 54.60 10/12/2016   LDLCALC 85 10/12/2016   TRIG 82.0 10/12/2016   CHOLHDL 3 10/12/2016   5) Hot flashes- improved some with gabapentin.  Review of Systems    see HPI  Past Medical History:  Diagnosis Date  . Hyperglycemia   . Hyperlipemia   . Hypertension   . Thyroid disease      Social History   Socioeconomic History  . Marital status: Married    Spouse name: Not on file  . Number of children: Not on file  . Years of education: Not on file  . Highest education level: Not on file  Social Needs  . Financial resource strain: Not on file  . Food insecurity - worry: Not on file  . Food insecurity - inability: Not on file  . Transportation needs - medical: Not on file  . Transportation needs - non-medical: Not on file  Occupational History  . Not on file  Tobacco Use  . Smoking status: Never Smoker  . Smokeless tobacco: Never Used  Substance and Sexual Activity  . Alcohol use: No  . Drug use: No  . Sexual activity: Not on file  Other Topics Concern  . Not on file  Social History Narrative   Retired from [employer]   3 grown children (oldest daughter in [place], Daughter local, Son deceased was murdered)   Married   Enjoys  reading, crossword, adult coloring, sewing    No pets    Past Surgical History:  Procedure Laterality Date  . ABDOMINAL HYSTERECTOMY    . BACK SURGERY    . BIOPSY BREAST     left breast  . BREAST BIOPSY Left    benign   . ROTATOR CUFF REPAIR     bilateral    Family History  Problem Relation Age of Onset  . Hypertension Unknown        both sides of family  . Heart disease Unknown   . Cancer Brother        throat  . Heart attack Mother        died at [id number]  . Heart attack Sister        age 73  . Prostate cancer Father     No Known Allergies  Current Outpatient Medications on File Prior to Visit  Medication Sig Dispense Refill  . amLODipine (NORVASC) 5 MG tablet Take 1 tablet (5 mg total) by mouth daily. 30 tablet 3  . aspirin 81 MG tablet Take 81 mg by mouth daily.    Marland Kitchen. atorvastatin (LIPITOR) 10 MG tablet Take 1 tablet (10 mg total) by mouth daily. 90 tablet 1  . Calcium Carbonate (  CALTRATE 600 PO) Take by mouth daily.    Marland Kitchen gabapentin (NEURONTIN) 300 MG capsule Take 1 capsule (300 mg total) by mouth at bedtime. 90 capsule 1  . levothyroxine (SYNTHROID, LEVOTHROID) 50 MCG tablet TAKE ONE TABLET BY MOUTH  ONCE DAILY EXCEPT 1/2  TABLET BY MOUTH ON SUNDAYS. 84 tablet 1  . losartan (COZAAR) 50 MG tablet Take 2 tablets (100 mg total) by mouth daily. 180 tablet 1  . metoprolol tartrate (LOPRESSOR) 25 MG tablet Take 1 tablet (25 mg total) by mouth 2 (two) times daily. 180 tablet 1  . omeprazole (PRILOSEC) 20 MG capsule Take 1 capsule (20 mg total) by mouth daily. 90 capsule 1   No current facility-administered medications on file prior to visit.     BP 140/67 (BP Location: Right Arm, Cuff Size: Large)   Pulse 60   Temp 97.9 F (36.6 C) (Oral)   Resp 18   Ht 5' 6.5" (1.689 m)   Wt 180 lb 6.4 oz (81.8 kg)   SpO2 100%   BMI 28.68 kg/m    Objective:   Physical Exam  Constitutional: She is oriented to person, place, and time. She appears well-developed and well-nourished.    Cardiovascular: Normal rate, regular rhythm and normal heart sounds.  No murmur heard. Pulmonary/Chest: Effort normal and breath sounds normal. No respiratory distress. She has no wheezes.  Neurological: She is alert and oriented to person, place, and time.  Psychiatric: She has a normal mood and affect. Her behavior is normal. Judgment and thought content normal.          Assessment & Plan:  HTN- improved, continue current meds. Continue same.  Hypothyroid- stable on synthroid, continue same.  Hyperglycemia- we discussed diabetic diet, exercise, weight loss. Plan A1C next visit.  Lab Results  Component Value Date   HGBA1C 6.0 10/20/2016   Hyperlipidemia- tolerating statin, at goal, plan flp next visit.  GERD- stable with PRN PPI.

## 2017-08-18 ENCOUNTER — Ambulatory Visit: Payer: Self-pay | Admitting: *Deleted

## 2017-08-18 ENCOUNTER — Encounter: Payer: Self-pay | Admitting: *Deleted

## 2017-08-18 NOTE — Telephone Encounter (Signed)
This encounter was created in error - please disregard.

## 2017-08-18 NOTE — Telephone Encounter (Signed)
Pt  Denies  Any  Injury  She  Reports  l   Hip  Pain  Denies   Any    Injury  History of  Sciatica  In other  Hip   Reason for Disposition . [1] MODERATE pain (e.g., interferes with normal activities, limping) AND [2] present > 3 days  Answer Assessment - Initial Assessment Questions 1. LOCATION and RADIATION: "Where is the pain located?"      l  Hip   2. QUALITY: "What does the pain feel like?"  (e.g., sharp, dull, aching, burning)      Sharp    3. SEVERITY: "How bad is the pain?" "What does it keep you from doing?"   (Scale 1-10; or mild, moderate, severe)   -  MILD (1-3): doesn't interfere with normal activities    -  MODERATE (4-7): interferes with normal activities (e.g., work or school) or awakens from sleep, limping    -  SEVERE (8-10): excruciating pain, unable to do any normal activities, unable to walk         Moderate   4. ONSET: "When did the pain start?" "Does it come and go, or is it there all the time?"          2  Days    -  Getting  Worse    5. WORK OR EXERCISE: "Has there been any recent work or exercise that involved this part of the body?"          None   6. CAUSE: "What do you think is causing the hip pain?"         Back  Problems  In  Past   7. AGGRAVATING FACTORS: "What makes the hip pain worse?" (e.g., walking, climbing stairs, running)          Certain   Movements  And  Weight  Bearing   8. OTHER SYMPTOMS: "Do you have any other symptoms?" (e.g., back pain, pain shooting down leg,  fever, rash)       No  Protocols used: HIP PAIN-A-AH

## 2017-08-19 ENCOUNTER — Ambulatory Visit (INDEPENDENT_AMBULATORY_CARE_PROVIDER_SITE_OTHER): Payer: Medicare Other | Admitting: Family

## 2017-08-19 ENCOUNTER — Encounter: Payer: Self-pay | Admitting: Family

## 2017-08-19 ENCOUNTER — Ambulatory Visit (HOSPITAL_BASED_OUTPATIENT_CLINIC_OR_DEPARTMENT_OTHER)
Admission: RE | Admit: 2017-08-19 | Discharge: 2017-08-19 | Disposition: A | Discharge status: Home / Self Care | Payer: Medicare Other | Source: Ambulatory Visit | Attending: Family | Admitting: Family

## 2017-08-19 VITALS — BP 131/71 | HR 76 | Temp 97.8°F | Resp 16 | Ht 66.5 in | Wt 181.8 lb

## 2017-08-19 DIAGNOSIS — E785 Hyperlipidemia, unspecified: Secondary | ICD-10-CM

## 2017-08-19 DIAGNOSIS — I10 Essential (primary) hypertension: Secondary | ICD-10-CM | POA: Diagnosis not present

## 2017-08-19 DIAGNOSIS — M25552 Pain in left hip: Secondary | ICD-10-CM | POA: Diagnosis not present

## 2017-08-19 DIAGNOSIS — M545 Low back pain: Secondary | ICD-10-CM | POA: Diagnosis not present

## 2017-08-19 DIAGNOSIS — R739 Hyperglycemia, unspecified: Secondary | ICD-10-CM | POA: Diagnosis not present

## 2017-08-19 DIAGNOSIS — M5410 Radiculopathy, site unspecified: Secondary | ICD-10-CM | POA: Diagnosis not present

## 2017-08-19 LAB — COMPREHENSIVE METABOLIC PANEL
ALT: 19 U/L (ref 0–35)
AST: 13 U/L (ref 0–37)
Albumin: 3.9 g/dL (ref 3.5–5.2)
Alkaline Phosphatase: 121 U/L — ABNORMAL HIGH (ref 39–117)
BILIRUBIN TOTAL: 0.6 mg/dL (ref 0.2–1.2)
BUN: 14 mg/dL (ref 6–23)
CO2: 30 meq/L (ref 19–32)
CREATININE: 0.85 mg/dL (ref 0.40–1.20)
Calcium: 9.6 mg/dL (ref 8.4–10.5)
Chloride: 108 mEq/L (ref 96–112)
GFR: 84.29 mL/min (ref >60.00)
GLUCOSE: 78 mg/dL (ref 70–99)
Potassium: 4.2 mEq/L (ref 3.5–5.1)
Sodium: 144 mEq/L (ref 135–145)
Total Protein: 7.5 g/dL (ref 6.0–8.3)

## 2017-08-19 LAB — LIPID PANEL
CHOL/HDL RATIO: 3
Cholesterol: 140 mg/dL (ref 0–200)
HDL: 45.7 mg/dL (ref >39.00)
LDL Cholesterol: 73 mg/dL (ref 0–99)
NonHDL: 94.55
Triglycerides: 106 mg/dL (ref 0.0–149.0)
VLDL: 21.2 mg/dL (ref 0.0–40.0)

## 2017-08-19 LAB — HEMOGLOBIN A1C: Hgb A1c MFr Bld: 6 % (ref 4.6–6.5)

## 2017-08-19 MED ORDER — METHOCARBAMOL 500 MG PO TABS: 500.0000 mg | ORAL_TABLET | Freq: Three times a day (TID) | ORAL | 0 refills | Status: AC | PRN

## 2017-08-19 MED ORDER — MELOXICAM 7.5 MG PO TABS: 7.5000 mg | ORAL_TABLET | Freq: Every day | ORAL | 0 refills | Status: DC

## 2017-08-19 MED ORDER — METHOCARBAMOL 500 MG PO TABS: 500.0000 mg | ORAL_TABLET | Freq: Three times a day (TID) | ORAL | 0 refills | Status: DC | PRN

## 2017-08-19 MED FILL — MELOXICAM 7.5 MG TABLET: 7.5 | 14 days supply | Qty: 14 | Fill #0

## 2017-08-19 MED FILL — METHOCARBAMOL 500 MG TABLET: 500 | 7 days supply | Qty: 20 | Fill #0

## 2017-08-19 NOTE — Patient Instructions (Addendum)
Please complete lab work prior to leaving.   Call if symptoms worsen or if not improved in 1 week. You may begin meloxicam once daily for hip pain. You may use robaxin as needed for muscle spasm.

## 2017-08-19 NOTE — Progress Notes (Signed)
Subjective:    Patient ID: Nicole Douglas, female    DOB: 09-Apr-1946, 72 y.o.   MRN: [id number]  HPI  Nicole Douglas is a 72 yr old female who presents today with chief complaint of right sided hip pain. Pain started on Monday- had some cramping pain. Has tried tylenol and heating pad. Helps only briefly.  Movement of hip makes her pain worse.  Can barely turn over in the bed.  Pain is located in the left/hip buttock region.    Review of Systems   Past Medical History:  Diagnosis Date  . Hyperglycemia   . Hyperlipemia   . Hypertension   . Thyroid disease      Social History   Socioeconomic History  . Marital status: Married    Spouse name: Not on file  . Number of children: Not on file  . Years of education: Not on file  . Highest education level: Not on file  Social Needs  . Financial resource strain: Not on file  . Food insecurity - worry: Not on file  . Food insecurity - inability: Not on file  . Transportation needs - medical: Not on file  . Transportation needs - non-medical: Not on file  Occupational History  . Not on file  Tobacco Use  . Smoking status: Never Smoker  . Smokeless tobacco: Never Used  Substance and Sexual Activity  . Alcohol use: No  . Drug use: No  . Sexual activity: Not on file  Other Topics Concern  . Not on file  Social History Narrative   Retired from [employer]   3 grown children (oldest daughter in [place], Daughter local, Son deceased was murdered)   Married   Enjoys reading, crossword, adult coloring, sewing    No pets    Past Surgical History:  Procedure Laterality Date  . ABDOMINAL HYSTERECTOMY    . BACK SURGERY    . BIOPSY BREAST     left breast  . BREAST BIOPSY Left    benign   . ROTATOR CUFF REPAIR     bilateral    Family History  Problem Relation Age of Onset  . Hypertension Unknown        both sides of family  . Heart disease Unknown   . Cancer Brother        throat  . Heart attack Mother        died at 62  .  Heart attack Sister        age 32  . Prostate cancer Father     No Known Allergies  Current Outpatient Medications on File Prior to Visit  Medication Sig Dispense Refill  . amLODipine (NORVASC) 5 MG tablet Take 1 tablet (5 mg total) by mouth daily. 90 tablet 1  . aspirin 81 MG tablet Take 81 mg by mouth daily.    Marland Kitchen atorvastatin (LIPITOR) 10 MG tablet Take 1 tablet (10 mg total) by mouth daily. 90 tablet 1  . Calcium Carbonate (CALTRATE 600 PO) Take by mouth daily.    Marland Kitchen gabapentin (NEURONTIN) 300 MG capsule Take 1 capsule (300 mg total) by mouth at bedtime. 90 capsule 1  . levothyroxine (SYNTHROID, LEVOTHROID) 50 MCG tablet TAKE ONE TABLET BY MOUTH  ONCE DAILY EXCEPT 1/2  TABLET BY MOUTH ON SUNDAYS. 84 tablet 1  . losartan (COZAAR) 50 MG tablet Take 2 tablets (100 mg total) by mouth daily. 180 tablet 1  . metoprolol tartrate (LOPRESSOR) 25 MG tablet Take 1 tablet (  25 mg total) by mouth 2 (two) times daily. 180 tablet 1  . omeprazole (PRILOSEC) 20 MG capsule Take 1 capsule (20 mg total) by mouth daily. 90 capsule 1   No current facility-administered medications on file prior to visit.     BP 131/71 (BP Location: Right Arm, Patient Position: Sitting, Cuff Size: Normal)   Pulse 76   Temp 97.8 F (36.6 C) (Oral)   Resp 16   Ht 5' 6.5" (1.689 m)   Wt 181 lb 12.8 oz (82.5 kg)   SpO2 99%   BMI 28.90 kg/m       Objective:   Physical Exam  Constitutional: She appears well-developed and well-nourished.  Cardiovascular: Normal rate, regular rhythm and normal heart sounds.  No murmur heard. Pulmonary/Chest: Effort normal and breath sounds normal. No respiratory distress. She has no wheezes.  Musculoskeletal:       Cervical back: She exhibits no tenderness.       Thoracic back: She exhibits no tenderness.       Lumbar back: She exhibits no tenderness.  Neurological:  Reflex Scores:      Patellar reflexes are 2+ on the right side and 2+ on the left side. Bilateral LE strength is 5/5    Psychiatric: She has a normal mood and affect. Her behavior is normal. Judgment and thought content normal.          Assessment & Plan:  Radicular low back pain- x ray performed of left hip and lumbar spine. 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   Will rx with meloxicam and prn robaxin. She is advised to call if new/worsening symptoms or if symptoms fail to improve.

## 2017-08-22 ENCOUNTER — Telehealth: Payer: Self-pay | Admitting: Family

## 2017-08-22 MED ORDER — METHYLPREDNISOLONE 4 MG PO TBPK: ORAL_TABLET | ORAL | 0 refills | Status: DC

## 2017-08-22 NOTE — Telephone Encounter (Signed)
Pt called, no improvement in symptoms. D/c meloxicam, trial of medrol dose pak, follow up is symptoms worsens or if no improvement.

## 2017-08-22 NOTE — Telephone Encounter (Signed)
Pt notified and is agreeable. Rx sent.

## 2017-09-30 ENCOUNTER — Ambulatory Visit: Payer: Medicare Other | Admitting: Family

## 2017-11-07 ENCOUNTER — Telehealth: Payer: Self-pay

## 2017-11-07 ENCOUNTER — Other Ambulatory Visit: Payer: Self-pay | Admitting: Family

## 2017-11-07 NOTE — Telephone Encounter (Signed)
SORRY, SHE WAS SUPPOSED TO FOLLOW UP IN April FOR HYPERLIPIDEMIA AND HYPERTENSION. SHE CANCELLED HER April APPOINTMENT AND HAS NOT RESCHEDULED. WE GAVE HER ONE REFILL ON HER MEDICATIONS BUT SHE NEEDS TO FOLLOW UP.

## 2017-11-07 NOTE — Telephone Encounter (Signed)
Can you please clarify when she needs to come in and what the follow up is for?

## 2017-11-07 NOTE — Telephone Encounter (Signed)
PLEASE CALL PATIENT FOR FOLLOW UP APPOINTMENT.

## 2017-11-08 NOTE — Telephone Encounter (Signed)
Call pt and scheduled a follow up visit for 11/16/17.

## 2017-11-16 ENCOUNTER — Ambulatory Visit (INDEPENDENT_AMBULATORY_CARE_PROVIDER_SITE_OTHER): Payer: Medicare Other | Admitting: Family

## 2017-11-16 ENCOUNTER — Encounter: Payer: Self-pay | Admitting: Family

## 2017-11-16 VITALS — BP 164/90 | HR 57 | Temp 98.2°F | Resp 16 | Ht 66.5 in

## 2017-11-16 DIAGNOSIS — R739 Hyperglycemia, unspecified: Secondary | ICD-10-CM | POA: Diagnosis not present

## 2017-11-16 DIAGNOSIS — R350 Frequency of micturition: Secondary | ICD-10-CM

## 2017-11-16 DIAGNOSIS — M5416 Radiculopathy, lumbar region: Secondary | ICD-10-CM

## 2017-11-16 DIAGNOSIS — I10 Essential (primary) hypertension: Secondary | ICD-10-CM

## 2017-11-16 MED ORDER — MELOXICAM 7.5 MG PO TABS: 7.5000 mg | ORAL_TABLET | Freq: Every day | ORAL | 0 refills | Status: DC

## 2017-11-16 MED FILL — MELOXICAM 7.5 MG TABLET: 7.5 | 14 days supply | Qty: 14 | Fill #0

## 2017-11-16 NOTE — Patient Instructions (Addendum)
Begin meloxicam for right leg pain/numbness. If this does not help please arrange a sooner appointment with Dr. Lovell Sheehan. Complete lab work prior to leaving.  Restart amlodipine.

## 2017-11-16 NOTE — Progress Notes (Signed)
Subjective:    Patient ID: Nicole Douglas, female    DOB: 03-13-1946, 72 y.o.   MRN: [id number]  HPI   Nicole Douglas is a 72 yr old female who presents today for follow up.  HTN- current bp meds include amlodipine , losartan , metoprolol  bid. Reports that she has not been taking amlodipine.  Will restart.  BP Readings from Last 3 Encounters:  11/16/17 (!) 164/90  08/19/17 131/71  07/01/17 140/67   Hyperglycemia-  Lab Results  Component Value Date   HGBA1C 6.0 08/19/2017     Hyperlipidemia- maintained on atorvastatin.  Lab Results  Component Value Date   CHOL 140 08/19/2017   HDL 45.70 08/19/2017   LDLCALC 73 08/19/2017   TRIG 106.0 08/19/2017   CHOLHDL 3 08/19/2017   Reports some numbness "beneath my toes and around my ankle." Back was hurting last week.  Urinary frequency- no dysuria.   Review of Systems See HPI  Past Medical History:  Diagnosis Date  . Hyperglycemia   . Hyperlipemia   . Hypertension   . Thyroid disease      Social History   Socioeconomic History  . Marital status: Married    Spouse name: Not on file  . Number of children: Not on file  . Years of education: Not on file  . Highest education level: Not on file  Occupational History  . Not on file  Social Needs  . Financial resource strain: Not on file  . Food insecurity:    Worry: Not on file    Inability: Not on file  . Transportation needs:    Medical: Not on file    Non-medical: Not on file  Tobacco Use  . Smoking status: Never Smoker  . Smokeless tobacco: Never Used  Substance and Sexual Activity  . Alcohol use: No  . Drug use: No  . Sexual activity: Not on file  Lifestyle  . Physical activity:    Days per week: Not on file    Minutes per session: Not on file  . Stress: Not on file  Relationships  . Social connections:    Talks on phone: Not on file    Gets together: Not on file    Attends religious service: Not on file    Active member of club or  organization: Not on file    Attends meetings of clubs or organizations: Not on file    Relationship status: Not on file  . Intimate partner violence:    Fear of current or ex partner: Not on file    Emotionally abused: Not on file    Physically abused: Not on file    Forced sexual activity: Not on file  Other Topics Concern  . Not on file  Social History Narrative   Retired from [employer]   3 grown children (oldest daughter in [place], Daughter local, Son deceased was murdered)   Married   Enjoys reading, crossword, adult coloring, sewing    No pets    Past Surgical History:  Procedure Laterality Date  . ABDOMINAL HYSTERECTOMY    . BACK SURGERY    . BIOPSY BREAST     left breast  . BREAST BIOPSY Left    benign   . ROTATOR CUFF REPAIR     bilateral    Family History  Problem Relation Age of Onset  . Hypertension Unknown        both sides of family  . Heart disease Unknown   .  Cancer Brother        throat  . Heart attack Mother        died at 102  . Heart attack Sister        age 58  . Prostate cancer Father     No Known Allergies  Current Outpatient Medications on File Prior to Visit  Medication Sig Dispense Refill  . amLODipine (NORVASC) 5 MG tablet TAKE 1 TABLET BY MOUTH  DAILY 90 tablet 0  . aspirin 81 MG tablet Take 81 mg by mouth daily.    Marland Kitchen atorvastatin (LIPITOR) 10 MG tablet TAKE 1 TABLET BY MOUTH  DAILY 90 tablet 0  . Calcium Carbonate (CALTRATE 600 PO) Take by mouth daily.    Marland Kitchen gabapentin (NEURONTIN) 300 MG capsule TAKE 1 CAPSULE BY MOUTH AT  BEDTIME 90 capsule 0  . levothyroxine (SYNTHROID, LEVOTHROID) 50 MCG tablet TAKE ONE TABLET BY MOUTH  ONCE DAILY EXCEPT 1/2  TABLET ON SUNDAYS. 83 tablet 0  . losartan (COZAAR) 50 MG tablet TAKE 2 TABLETS BY MOUTH  DAILY 180 tablet 0  . metoprolol tartrate (LOPRESSOR) 25 MG tablet TAKE 1 TABLET BY MOUTH TWO  TIMES DAILY 180 tablet 0  . omeprazole (PRILOSEC) 20 MG capsule TAKE 1 CAPSULE BY MOUTH  DAILY 90 capsule 0     No current facility-administered medications on file prior to visit.     BP (!) 164/90 (BP Location: Right Arm, Patient Position: Sitting, Cuff Size: Small)   Pulse (!) 57   Temp 98.2 F (36.8 C) (Oral)   Resp 16   Ht 5' 6.5" (1.689 m)   SpO2 98%   BMI 28.90 kg/m       Objective:   Physical Exam  Constitutional: She is oriented to person, place, and time. She appears well-developed and well-nourished.  Cardiovascular: Normal rate, regular rhythm and normal heart sounds.  No murmur heard. Pulmonary/Chest: Effort normal and breath sounds normal. No respiratory distress. She has no wheezes.  Neurological: She is oriented to person, place, and time. She displays normal reflexes.  Reflex Scores:      Patellar reflexes are 1+ on the left side. Bilateral LE strength is 5/5  Psychiatric: She has a normal mood and affect. Her behavior is normal. Judgment and thought content normal.          Assessment & Plan:  RLE numbness/lumbar radiculopathy- hx of lumbar disc disease. Sees neurosurgery. Advised pt to begin meloxicam, if no improvement, recommended that she arrange earlier follow up with neurosurgery. (has apt in July scheduled)  HTN- uncontrolled due to non-compliance with amlodipine, advised pt to restart.  Hyperglycemia- last A1C 6.0. We discussed diabetic diet and exercise.  Urinary frequency- check urine culture. If negative consider trial of myrbetriq.

## 2017-11-17 LAB — URINE CULTURE
MICRO NUMBER:: 90646371
RESULT: NO GROWTH
SPECIMEN QUALITY:: ADEQUATE

## 2017-11-18 ENCOUNTER — Other Ambulatory Visit: Payer: Self-pay | Admitting: Family

## 2017-11-18 NOTE — Telephone Encounter (Signed)
Please let pt know that her urine culture is negative for infection. Let's have her try myrbetriq for overactive bladder to see if this helps. Follow up as scheduled.

## 2017-11-21 NOTE — Telephone Encounter (Signed)
Left message for pt to return my call. Ok for [facility] / triage to discuss with pt.  

## 2017-11-29 MED ORDER — MIRABEGRON ER 25 MG PO TB24
25.0000 mg | ORAL_TABLET | Freq: Every day | ORAL | 2 refills | Status: DC
Start: 2017-11-29 — End: 2018-02-15

## 2017-11-29 NOTE — Telephone Encounter (Signed)
Attempted to reach pt and left message for pt to return my call. 

## 2017-11-29 NOTE — Telephone Encounter (Signed)
Notified pt and she is agreeable to proceed with medication. She is concerned about potential of medication causing elevated BP. Advised pt that we have pt's that currently take medication and tolerate it well but we will be monitoring her vitals (BP) at her follow up appts as well. Pt voices understanding and asks that Rx go to [facility]. Rx sent.

## 2017-12-21 ENCOUNTER — Telehealth: Payer: Self-pay | Admitting: *Deleted

## 2017-12-21 DIAGNOSIS — R350 Frequency of micturition: Secondary | ICD-10-CM

## 2017-12-21 NOTE — Telephone Encounter (Signed)
Spoke with pt. Pt states medication is going to be a little costly and she is concerned about potential side effect of causing elevated BP. Pt states her bp is already elevated and she is on 2 BP medications. Pt is wanting referral to a specialist for other medication recommendations and non-surgical options.  Please advise?

## 2017-12-21 NOTE — Telephone Encounter (Signed)
Copied from CRM [phone]. Topic: Referral - Request >> Dec 21, 2017  2:57 PM Nicole AmatoBurton, Donna F wrote: Pt is needing a referral for issues she is having with her bladder  Second opinion before she starts taking the mirabegron er -she states she does not like the side effects regarding raising her blood pressure    Best number [phone]

## 2017-12-21 NOTE — Telephone Encounter (Signed)
Referral placed to urology.

## 2018-02-02 ENCOUNTER — Other Ambulatory Visit: Payer: Self-pay | Admitting: Family

## 2018-02-08 ENCOUNTER — Other Ambulatory Visit: Payer: Self-pay | Admitting: Family

## 2018-02-15 ENCOUNTER — Ambulatory Visit (INDEPENDENT_AMBULATORY_CARE_PROVIDER_SITE_OTHER): Payer: Medicare Other | Admitting: Family

## 2018-02-15 ENCOUNTER — Encounter: Payer: Self-pay | Admitting: Family

## 2018-02-15 VITALS — BP 126/64 | HR 62 | Temp 98.1°F | Resp 18 | Ht 66.5 in | Wt 177.0 lb

## 2018-02-15 DIAGNOSIS — E785 Hyperlipidemia, unspecified: Secondary | ICD-10-CM

## 2018-02-15 DIAGNOSIS — R739 Hyperglycemia, unspecified: Secondary | ICD-10-CM | POA: Diagnosis not present

## 2018-02-15 DIAGNOSIS — I10 Essential (primary) hypertension: Secondary | ICD-10-CM | POA: Diagnosis not present

## 2018-02-15 DIAGNOSIS — H8112 Benign paroxysmal vertigo, left ear: Secondary | ICD-10-CM | POA: Diagnosis not present

## 2018-02-15 DIAGNOSIS — E039 Hypothyroidism, unspecified: Secondary | ICD-10-CM | POA: Diagnosis not present

## 2018-02-15 LAB — BASIC METABOLIC PANEL
BUN: 18 mg/dL (ref 6–23)
CALCIUM: 9.7 mg/dL (ref 8.4–10.5)
CO2: 32 meq/L (ref 19–32)
CREATININE: 0.79 mg/dL (ref 0.40–1.20)
Chloride: 103 mEq/L (ref 96–112)
GFR: 91.6 mL/min (ref >60.00)
Glucose, Bld: 124 mg/dL — ABNORMAL HIGH (ref 70–99)
Potassium: 4.5 mEq/L (ref 3.5–5.1)
Sodium: 143 mEq/L (ref 135–145)

## 2018-02-15 LAB — TSH: TSH: 1.62 u[IU]/mL (ref 0.35–4.50)

## 2018-02-15 LAB — HEMOGLOBIN A1C: Hgb A1c MFr Bld: 6.1 % (ref 4.6–6.5)

## 2018-02-15 MED ORDER — MECLIZINE HCL 25 MG PO TABS: 25.0000 mg | ORAL_TABLET | Freq: Three times a day (TID) | ORAL | 0 refills | Status: DC | PRN

## 2018-02-15 MED FILL — MECLIZINE 25 MG TABLET: 25 | 10 days supply | Qty: 30 | Fill #0

## 2018-02-15 NOTE — Progress Notes (Signed)
Subjective:    Patient ID: Nicole Douglas, female    DOB: [birth year], 72 y.o.   MRN: [id number]  HPI  Patient is a 72 yr old female who presents today for follow up:  1) HTN- on metoprolol and amlodipine. Denies Swelling, CP or SOB.  BP Readings from Last 3 Encounters:  02/15/18 126/64  11/16/17 (!) 164/90  08/19/17 131/71   2) Hyperlipidemia- continues lipitor. Denies myalgia. Has occasional leg cramping.  Lab Results  Component Value Date   CHOL 140 08/19/2017   HDL 45.70 08/19/2017   LDLCALC 73 08/19/2017   TRIG 106.0 08/19/2017   CHOLHDL 3 08/19/2017   3) Hyperglycemia-  Lab Results  Component Value Date   HGBA1C 6.0 08/19/2017    4) Dizziness- intermittent since last week, worse with certain movements. Reports that dizziness occurs with sitting up. Has hx of vertigo. Reports that her current symptoms are not as severe as previous vertigo   5) urinary frequency- did not start myrbetriq due to cost/concern about increased BP. Reports that the rsymptoms are not severe.   Hypothyroid- reports that she feels well on synthroid.  Lab Results  Component Value Date   TSH 1.86 06/03/2017     Review of Systems See HPI  Past Medical History:  Diagnosis Date  . Hyperglycemia   . Hyperlipemia   . Hypertension   . Thyroid disease      Social History   Socioeconomic History  . Marital status: Married    Spouse name: Not on file  . Number of children: Not on file  . Years of education: Not on file  . Highest education level: Not on file  Occupational History  . Not on file  Social Needs  . Financial resource strain: Not on file  . Food insecurity:    Worry: Not on file    Inability: Not on file  . Transportation needs:    Medical: Not on file    Non-medical: Not on file  Tobacco Use  . Smoking status: Never Smoker  . Smokeless tobacco: Never Used  Substance and Sexual Activity  . Alcohol use: No  . Drug use: No  . Sexual activity: Not on file    Lifestyle  . Physical activity:    Days per week: Not on file    Minutes per session: Not on file  . Stress: Not on file  Relationships  . Social connections:    Talks on phone: Not on file    Gets together: Not on file    Attends religious service: Not on file    Active member of club or organization: Not on file    Attends meetings of clubs or organizations: Not on file    Relationship status: Not on file  . Intimate partner violence:    Fear of current or ex partner: Not on file    Emotionally abused: Not on file    Physically abused: Not on file    Forced sexual activity: Not on file  Other Topics Concern  . Not on file  Social History Narrative   Retired from [employer]   3 grown children (oldest daughter in [place], Daughter local, Son deceased was murdered)   Married   Enjoys reading, crossword, adult coloring, sewing    No pets    Past Surgical History:  Procedure Laterality Date  . ABDOMINAL HYSTERECTOMY    . BACK SURGERY    . BIOPSY BREAST     left breast  .  BREAST BIOPSY Left    benign   . ROTATOR CUFF REPAIR     bilateral    Family History  Problem Relation Age of Onset  . Hypertension Unknown        both sides of family  . Heart disease Unknown   . Cancer Brother        throat  . Heart attack Mother        died at 34  . Heart attack Sister        age 69  . Prostate cancer Father     No Known Allergies  Current Outpatient Medications on File Prior to Visit  Medication Sig Dispense Refill  . amLODipine (NORVASC) 5 MG tablet TAKE 1 TABLET BY MOUTH  DAILY 90 tablet 0  . aspirin 81 MG tablet Take 81 mg by mouth daily.    Marland Kitchen atorvastatin (LIPITOR) 10 MG tablet TAKE 1 TABLET BY MOUTH  DAILY 90 tablet 0  . Calcium Carbonate (CALTRATE 600 PO) Take by mouth daily.    Marland Kitchen gabapentin (NEURONTIN) 300 MG capsule TAKE 1 CAPSULE BY MOUTH AT  BEDTIME 90 capsule 0  . levothyroxine (SYNTHROID, LEVOTHROID) 50 MCG tablet TAKE ONE TABLET BY MOUTH  ONCE DAILY EXCEPT  1/2  TABLET ON SUNDAYS. 83 tablet 0  . losartan (COZAAR) 50 MG tablet TAKE 2 TABLETS BY MOUTH  DAILY 180 tablet 0  . metoprolol tartrate (LOPRESSOR) 25 MG tablet TAKE 1 TABLET BY MOUTH TWO  TIMES DAILY 180 tablet 0  . omeprazole (PRILOSEC) 20 MG capsule TAKE 1 CAPSULE BY MOUTH  DAILY 90 capsule 0   No current facility-administered medications on file prior to visit.     BP 126/64 (BP Location: Left Arm, Cuff Size: Normal)   Pulse 62   Temp 98.1 F (36.7 C) (Oral)   Resp 18   Ht 5' 6.5" (1.689 m)   Wt 177 lb (80.3 kg)   SpO2 99%   BMI 28.14 kg/m       Objective:   Physical Exam  Constitutional: She is oriented to person, place, and time. She appears well-developed and well-nourished.  Cardiovascular: Normal rate, regular rhythm and normal heart sounds.  No murmur heard. Pulmonary/Chest: Effort normal and breath sounds normal. No respiratory distress. She has no wheezes.  Neurological: She is alert and oriented to person, place, and time.  +reproducible dizziness bilaterally with hall-pike dix maneuver, very mild nystagmus noted left eye  Skin: Skin is warm and dry.  Psychiatric: She has a normal mood and affect. Her behavior is normal. Judgment and thought content normal.          Assessment & Plan:  HTN- bp looks good today. Continue current meds. Obtain follow up bmet.  BP Readings from Last 3 Encounters:  02/15/18 126/64  11/16/17 (!) 164/90  08/19/17 131/71   Hypothyroid- clinically stable on synthroid, continue same.  BPPV- trial of prn meclizine.  Hyperglycemia- discussed diet. Obtain follow up A1C.   Hyperlipidemia- at goal on statin. Continue same.

## 2018-02-15 NOTE — Patient Instructions (Signed)
Please complete lab work prior to leaving. Begin meclizine as needed for dizziness. Call if vertigo symptoms worsen or if they do not improve in the next 1-2 weeks.

## 2018-04-17 ENCOUNTER — Telehealth: Payer: Self-pay | Admitting: *Deleted

## 2018-04-17 NOTE — Telephone Encounter (Signed)
I don't see that Efraim Kaufmann has seen her for this. Would rec that she schedule an appt unless it is indeed already known about. TY.

## 2018-04-17 NOTE — Telephone Encounter (Signed)
Notations were made in pt's office visit in March and May about low back pain with radiculopathy. Pt has been given meloxicam and medrol dosepak as well as robaxin. 11/16/17 OV says pt was supposed to see a neurosurgeon in July. Attempted to reach pt to verify if she saw neurosurgeon and if so, when was last visit and what was the outcome? If she has not been to neurosurgery she needs to contact them to arrange appt. Ok for [facility] / triage to discuss with pt.

## 2018-04-17 NOTE — Telephone Encounter (Signed)
Copied from CRM [id number]. Topic: Referral - Request for Referral >> Apr 17, 2018 10:11 AM Mickel Baas B, NT wrote: Has patient seen PCP for this complaint? Yes.   *If NO, is insurance requiring patient see PCP for this issue before PCP can refer them? Referral for which specialty: Podiatry Preferred provider/office:  Reason for referral: foot care/ numbness in toes and up to middle of her calf over a year. States started after her ER visit for a back injury

## 2018-04-18 NOTE — Telephone Encounter (Signed)
Melissa -- please review below notes and advise?

## 2018-04-18 NOTE — Telephone Encounter (Signed)
Patient called and I advised of the note below from Dr. Carmelia Roller and Nicki Guadalajara, CMA. The patient says she did go to neurosurgeon, Dr. Lovell Sheehan off [facility]  around April-August. She says he ordered some x-rays and she was supposed to go back for him to read them, but she figured it was not serious, since no one contacted her. She says he examined her and said  nothing surgical related, took the x-rays and we left. She says she was supposed to go back around the last of August. She says she did tell him about her feet and legs, but he made no comment on them. I advised this will be sent to Dr. Carmelia Roller and someone from the office will call with any recommendations.

## 2018-04-19 NOTE — Telephone Encounter (Signed)
Spoke with pt and scheduled OV for Friday at 7:40am.

## 2018-04-19 NOTE — Telephone Encounter (Signed)
Let's bring her back in for re-evaluation please.

## 2018-04-21 ENCOUNTER — Ambulatory Visit (INDEPENDENT_AMBULATORY_CARE_PROVIDER_SITE_OTHER): Payer: Medicare Other | Admitting: Family

## 2018-04-21 ENCOUNTER — Encounter: Payer: Self-pay | Admitting: Family

## 2018-04-21 VITALS — BP 144/69 | HR 61 | Temp 98.3°F | Resp 18 | Ht 66.5 in | Wt 181.2 lb

## 2018-04-21 DIAGNOSIS — M5416 Radiculopathy, lumbar region: Secondary | ICD-10-CM

## 2018-04-21 DIAGNOSIS — M545 Low back pain, unspecified: Secondary | ICD-10-CM

## 2018-04-21 DIAGNOSIS — Z23 Encounter for immunization: Secondary | ICD-10-CM | POA: Diagnosis not present

## 2018-04-21 MED ORDER — MELOXICAM 7.5 MG PO TABS: 7.5000 mg | ORAL_TABLET | Freq: Every day | ORAL | 0 refills | Status: DC

## 2018-04-21 MED FILL — MELOXICAM 7.5 MG TABLET: 7.5 | 30 days supply | Qty: 30 | Fill #0

## 2018-04-21 NOTE — Patient Instructions (Addendum)
You should be contacted about scheduling your MRI once we obtain insurance approval.  Please begin meloxicam once daily.

## 2018-04-21 NOTE — Progress Notes (Signed)
Subjective:    Patient ID: Nicole Douglas, female    DOB: 23-Nov-1945, 72 y.o.   MRN: [id number]  HPI   Patient is a 72 year old female who presents today with chief complaint of intermittent bilateral calf pain.  Has associated numbness in both of her toes and feet.  Symptoms started in April.  Symptoms have been constant for 1 month.  Of note we saw her back in March with complaint of radicular low back pain.  Plain films noted degenerative disc disease of the lumbar spine as well as a grade 1 spondylolisthesis of L4-L5 which is felt likely to be degenerative in origin.  Denies incontinence.  Notes that she has a "heaviness" in her legs   Review of Systems See HPI  Past Medical History:  Diagnosis Date  . Hyperglycemia   . Hyperlipemia   . Hypertension   . Thyroid disease      Social History   Socioeconomic History  . Marital status: Married    Spouse name: Not on file  . Number of children: Not on file  . Years of education: Not on file  . Highest education level: Not on file  Occupational History  . Not on file  Social Needs  . Financial resource strain: Not on file  . Food insecurity:    Worry: Not on file    Inability: Not on file  . Transportation needs:    Medical: Not on file    Non-medical: Not on file  Tobacco Use  . Smoking status: Never Smoker  . Smokeless tobacco: Never Used  Substance and Sexual Activity  . Alcohol use: No  . Drug use: No  . Sexual activity: Not on file  Lifestyle  . Physical activity:    Days per week: Not on file    Minutes per session: Not on file  . Stress: Not on file  Relationships  . Social connections:    Talks on phone: Not on file    Gets together: Not on file    Attends religious service: Not on file    Active member of club or organization: Not on file    Attends meetings of clubs or organizations: Not on file    Relationship status: Not on file  . Intimate partner violence:    Fear of current or ex partner: Not on  file    Emotionally abused: Not on file    Physically abused: Not on file    Forced sexual activity: Not on file  Other Topics Concern  . Not on file  Social History Narrative   Retired from [employer]   3 grown children (oldest daughter in [place], Daughter local, Son deceased was murdered)   Married   Enjoys reading, crossword, adult coloring, sewing    No pets    Past Surgical History:  Procedure Laterality Date  . ABDOMINAL HYSTERECTOMY    . BACK SURGERY    . BIOPSY BREAST     left breast  . BREAST BIOPSY Left    benign   . ROTATOR CUFF REPAIR     bilateral    Family History  Problem Relation Age of Onset  . Hypertension Unknown        both sides of family  . Heart disease Unknown   . Cancer Brother        throat  . Heart attack Mother        died at 61  . Heart attack Sister  age 110  . Prostate cancer Father     No Known Allergies  Current Outpatient Medications on File Prior to Visit  Medication Sig Dispense Refill  . amLODipine (NORVASC) 5 MG tablet TAKE 1 TABLET BY MOUTH  DAILY 90 tablet 0  . aspirin 81 MG tablet Take 81 mg by mouth daily.    Marland Kitchen atorvastatin (LIPITOR) 10 MG tablet TAKE 1 TABLET BY MOUTH  DAILY 90 tablet 0  . Calcium Carbonate (CALTRATE 600 PO) Take by mouth daily.    Marland Kitchen gabapentin (NEURONTIN) 300 MG capsule TAKE 1 CAPSULE BY MOUTH AT  BEDTIME 90 capsule 0  . levothyroxine (SYNTHROID, LEVOTHROID) 50 MCG tablet TAKE ONE TABLET BY MOUTH  ONCE DAILY EXCEPT 1/2  TABLET ON SUNDAYS. 83 tablet 0  . losartan (COZAAR) 50 MG tablet TAKE 2 TABLETS BY MOUTH  DAILY 180 tablet 0  . meclizine (ANTIVERT) 25 MG tablet Take 1 tablet (25 mg total) by mouth 3 (three) times daily as needed for dizziness. 30 tablet 0  . metoprolol tartrate (LOPRESSOR) 25 MG tablet TAKE 1 TABLET BY MOUTH TWO  TIMES DAILY 180 tablet 0  . omeprazole (PRILOSEC) 20 MG capsule TAKE 1 CAPSULE BY MOUTH  DAILY 90 capsule 0   No current facility-administered medications on file prior  to visit.     BP (!) 144/69 (BP Location: Left Arm, Cuff Size: Normal)   Pulse 61   Temp 98.3 F (36.8 C) (Oral)   Resp 18   Ht 5' 6.5" (1.689 m)   Wt 181 lb 3.2 oz (82.2 kg)   SpO2 98%   BMI 28.81 kg/m       Objective:   Physical Exam  Constitutional: She appears well-developed and well-nourished.  Cardiovascular: Normal rate, regular rhythm and normal heart sounds.  No murmur heard. Pulses:      Dorsalis pedis pulses are 2+ on the right side, and 2+ on the left side.       Posterior tibial pulses are 2+ on the right side, and 2+ on the left side.  Pulmonary/Chest: Effort normal and breath sounds normal. No respiratory distress. She has no wheezes.  Musculoskeletal:       Cervical back: She exhibits no tenderness.       Thoracic back: She exhibits no tenderness.       Lumbar back: She exhibits no tenderness.  Neurological:  Reflex Scores:      Patellar reflexes are 1+ on the right side and 2+ on the left side. Diminished sensation to monofilament right foot plantar surface beneath toes  Bilateral LE strength is 5/5  Psychiatric: She has a normal mood and affect. Her behavior is normal. Judgment and thought content normal.          Assessment & Plan:  Lumbar radiculopathy- has had symptoms since 3/19, now with new numbness.  Will rx with meloxicam, order MRI for further evaluation. She is advised on red flags that should prompt her to call us ASAP such as bowel/bladder incontinence, worsening numbness, LE weakness.

## 2018-04-21 NOTE — Addendum Note (Signed)
Addended by: Mervin Kung A on: 04/21/2018 08:47 AM   Modules accepted: Orders

## 2018-04-22 ENCOUNTER — Ambulatory Visit (HOSPITAL_BASED_OUTPATIENT_CLINIC_OR_DEPARTMENT_OTHER)
Admission: RE | Admit: 2018-04-22 | Discharge: 2018-04-22 | Disposition: A | Discharge status: Home / Self Care | Payer: Medicare Other | Source: Ambulatory Visit | Attending: Family | Admitting: Family

## 2018-04-22 DIAGNOSIS — M48061 Spinal stenosis, lumbar region without neurogenic claudication: Secondary | ICD-10-CM | POA: Diagnosis not present

## 2018-04-22 DIAGNOSIS — M545 Low back pain, unspecified: Secondary | ICD-10-CM

## 2018-04-22 DIAGNOSIS — M47816 Spondylosis without myelopathy or radiculopathy, lumbar region: Secondary | ICD-10-CM | POA: Diagnosis not present

## 2018-04-23 ENCOUNTER — Telehealth: Payer: Self-pay | Admitting: Family

## 2018-04-23 DIAGNOSIS — M5136 Other intervertebral disc degeneration, lumbar region: Secondary | ICD-10-CM

## 2018-04-24 NOTE — Telephone Encounter (Signed)
Attempted to notify pt and left detailed message on pt's voicemail of below result and referral. Advised pt to let us know if she has not been contacted about an appointment within 1 week and to call if any questions.

## 2018-04-24 NOTE — Telephone Encounter (Signed)
Nicole Douglas -- is there anything I need tell pt about reason for referral?

## 2018-04-24 NOTE — Telephone Encounter (Signed)
Neurosurgery should be able to evaluate her imaging and see if there is anything they can add to help with her symptoms such as Epidural steroid injections or surgery.

## 2018-04-24 NOTE — Telephone Encounter (Signed)
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

## 2018-04-28 ENCOUNTER — Telehealth: Payer: Self-pay | Admitting: *Deleted

## 2018-04-28 NOTE — Telephone Encounter (Signed)
Copied from CRM [id number]. Topic: General - Other >> Apr 28, 2018  4:23 PM Percival Spanish wrote:  Pt call to say she will hold off on the referral. She said the following med seems to working well   meloxicam (MOBIC) 7.5 MG tablet

## 2018-05-01 ENCOUNTER — Other Ambulatory Visit: Payer: Self-pay | Admitting: Family

## 2018-05-22 ENCOUNTER — Ambulatory Visit: Payer: Medicare Other | Admitting: Family

## 2018-05-22 DIAGNOSIS — Z0289 Encounter for other administrative examinations: Secondary | ICD-10-CM

## 2018-05-23 ENCOUNTER — Encounter: Payer: Self-pay | Admitting: Family

## 2018-05-24 ENCOUNTER — Ambulatory Visit (INDEPENDENT_AMBULATORY_CARE_PROVIDER_SITE_OTHER): Payer: Medicare Other | Admitting: Family

## 2018-05-24 ENCOUNTER — Encounter: Payer: Self-pay | Admitting: Family

## 2018-05-24 VITALS — BP 150/69 | HR 65 | Temp 98.6°F | Ht 67.0 in | Wt 182.6 lb

## 2018-05-24 DIAGNOSIS — I10 Essential (primary) hypertension: Secondary | ICD-10-CM | POA: Diagnosis not present

## 2018-05-24 DIAGNOSIS — M5416 Radiculopathy, lumbar region: Secondary | ICD-10-CM | POA: Diagnosis not present

## 2018-05-24 MED ORDER — MELOXICAM 7.5 MG PO TABS: 7.5000 mg | ORAL_TABLET | Freq: Every day | ORAL | 0 refills | Status: DC

## 2018-05-24 MED FILL — MELOXICAM 7.5 MG TABLET: 7.5 | 30 days supply | Qty: 30 | Fill #0

## 2018-05-24 NOTE — Patient Instructions (Signed)
Please continue meloxicam once daily as needed.

## 2018-05-24 NOTE — Progress Notes (Signed)
Subjective:    Patient ID: Nicole Douglas, female    DOB: [birth year], 72 y.o.   MRN: [id number]  HPI  Patient is a 72 yr old female who presents today for follow up.  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  She reports low back pain is stable/improved.  She reports tingling "under my toes" and in the back of right calf.  Denies bowel/bladder incontinence.    Review of Systems See HPI  Past Medical History:  Diagnosis Date  . Hyperglycemia   . Hyperlipemia   . Hypertension   . Thyroid disease      Social History   Socioeconomic History  . Marital status: Married    Spouse name: Not on file  . Number of children: Not on file  . Years of education: Not on file  . Highest education level: Not on file  Occupational History  . Not on file  Social Needs  . Financial resource strain: Not on file  . Food insecurity:    Worry: Not on file    Inability: Not on file  . Transportation needs:    Medical: Not on file    Non-medical: Not on file  Tobacco Use  . Smoking status: Never Smoker  . Smokeless tobacco: Never Used  Substance and Sexual Activity  . Alcohol use: No  . Drug use: No  . Sexual activity: Not on file  Lifestyle  . Physical activity:    Days per week: Not on file    Minutes per session: Not on file  . Stress: Not on file  Relationships  . Social connections:    Talks on phone: Not on file    Gets together: Not on file    Attends religious service: Not on file    Active member of club or organization: Not on file    Attends meetings of clubs or organizations: Not on file    Relationship status: Not on file  . Intimate partner violence:    Fear of current or ex partner: Not on file    Emotionally abused: Not on file    Physically abused: Not on file    Forced sexual activity: Not on file  Other Topics Concern  . Not on file  Social History  Narrative   Retired from [employer]   3 grown children (oldest daughter in [place], Daughter local, Son deceased was murdered)   Married   Enjoys reading, crossword, adult coloring, sewing    No pets    Past Surgical History:  Procedure Laterality Date  . ABDOMINAL HYSTERECTOMY    . BACK SURGERY    . BIOPSY BREAST     left breast  . BREAST BIOPSY Left    benign   . ROTATOR CUFF REPAIR     bilateral    Family History  Problem Relation Age of Onset  . Hypertension Unknown        both sides of family  . Heart disease Unknown   . Cancer Brother        throat  . Heart attack Mother        died at 52  . Heart attack Sister        age 66  . Prostate cancer Father     No Known Allergies  Current Outpatient Medications on File Prior to Visit  Medication Sig Dispense Refill  . amLODipine (  NORVASC) 5 MG tablet TAKE 1 TABLET BY MOUTH  DAILY 90 tablet 1  . aspirin 81 MG tablet Take 81 mg by mouth daily.    Marland Kitchen. atorvastatin (LIPITOR) 10 MG tablet TAKE 1 TABLET BY MOUTH  DAILY 90 tablet 1  . Calcium Carbonate (CALTRATE 600 PO) Take by mouth daily.    Marland Kitchen. gabapentin (NEURONTIN) 300 MG capsule TAKE 1 CAPSULE BY MOUTH AT  BEDTIME 90 capsule 0  . levothyroxine (SYNTHROID, LEVOTHROID) 50 MCG tablet TAKE ONE TABLET BY MOUTH  ONCE DAILY EXCEPT 1/2  TABLET ON SUNDAYS. 83 tablet 1  . losartan (COZAAR) 50 MG tablet TAKE 2 TABLETS BY MOUTH  DAILY 180 tablet 1  . meclizine (ANTIVERT) 25 MG tablet Take 1 tablet (25 mg total) by mouth 3 (three) times daily as needed for dizziness. 30 tablet 0  . metoprolol tartrate (LOPRESSOR) 25 MG tablet TAKE 1 TABLET BY MOUTH TWO  TIMES DAILY 180 tablet 1  . omeprazole (PRILOSEC) 20 MG capsule TAKE 1 CAPSULE BY MOUTH  DAILY 90 capsule 0  . meloxicam (MOBIC) 7.5 MG tablet Take 1 tablet (7.5 mg total) by mouth daily. (Patient not taking: Reported on 05/24/2018) 30 tablet 0   No current facility-administered medications on file prior to visit.     BP (!) 150/69 (BP  Location: Right Arm, Patient Position: Sitting, Cuff Size: Large)   Pulse 65   Temp 98.6 F (37 C) (Oral)   Ht 5\' 7"  (1.702 m)   Wt 182 lb 9.6 oz (82.8 kg)   SpO2 100%   BMI 28.60 kg/m       Objective:   Physical Exam  Constitutional: She is oriented to person, place, and time. She appears well-developed and well-nourished.  Neck: Neck supple. No thyromegaly present.  Cardiovascular: Normal rate, regular rhythm and normal heart sounds.  No murmur heard. Pulmonary/Chest: Effort normal and breath sounds normal. No respiratory distress. She has no wheezes.  Neurological: She is alert and oriented to person, place, and time.  Reflex Scores:      Patellar reflexes are 2+ on the right side and 2+ on the left side. Bilateral LE strength is 5/5 Bilateral foot exam- sensation intact to monofilament  Skin: Skin is warm and dry.  Psychiatric: She has a normal mood and affect. Her behavior is normal. Judgment and thought content normal.          Assessment & Plan:   HTN-  BP is acceptable for her age. Continue current meds. BP Readings from Last 3 Encounters:  05/24/18 (!) 150/69  04/21/18 (!) 144/69  02/15/18 126/64   Lumbar radiculopathy- some better.  She wishes to hold off on referral to neurosurgery which I think is reasonable.  Advised her to continue meloxicam on an as-needed basis and call if increased back pain or increased numbness in her feet.

## 2018-08-23 ENCOUNTER — Ambulatory Visit: Payer: Medicare Other | Admitting: Family

## 2018-09-05 ENCOUNTER — Other Ambulatory Visit: Payer: Self-pay | Admitting: Family

## 2018-09-22 ENCOUNTER — Encounter: Payer: Self-pay | Admitting: Family

## 2018-10-17 ENCOUNTER — Encounter: Payer: Self-pay | Admitting: Family

## 2018-11-06 ENCOUNTER — Other Ambulatory Visit: Payer: Self-pay | Admitting: Family

## 2018-11-17 NOTE — Progress Notes (Addendum)
Virtual Visit via Video Note  I connected with patient on 11/20/18 at 11:00 AM EDT by a video enabled telemedicine application and verified that I am speaking with the correct person using two identifiers.   THIS ENCOUNTER IS A VIRTUAL VISIT DUE TO COVID-19 - PATIENT WAS NOT SEEN IN THE OFFICE. PATIENT HAS CONSENTED TO VIRTUAL VISIT / TELEMEDICINE VISIT   Location of patient: home  Location of provider: office  I discussed the limitations of evaluation and management by telemedicine and the availability of in person appointments. The patient expressed understanding and agreed to proceed.   Subjective:   Nicole Douglas is a 73 y.o. female who presents for Medicare Annual (Subsequent) preventive examination.  Enjoys reading and word search.  Review of Systems: No ROS.  Medicare Wellness Virtual Visit.  Visual/audio telehealth visit, UTA vital signs.   See social history for additional risk factors. Cardiac Risk Factors include: advanced age (>3155men, 30>65 women);dyslipidemia;hypertension Sleep patterns: no issues. Home Safety/Smoke Alarms: Feels safe in home. Smoke alarms in place.  Lives with husband in ranch 1 story home.   Female:       Mammo-  ordered     Dexa scan- ordered        CCS- 08/20/10 per pt     Objective:     Advanced Directives 11/20/2018 12/28/2016  Does Patient Have a Medical Advance Directive? No No  Would patient like information on creating a medical advance directive? No - Patient declined -    Tobacco Social History   Tobacco Use  Smoking Status Never Smoker  Smokeless Tobacco Never Used     Counseling given: Not Answered   Clinical Intake:     Pain : No/denies pain      Past Medical History:  Diagnosis Date  . Hyperglycemia   . Hyperlipemia   . Hypertension   . Thyroid disease    Past Surgical History:  Procedure Laterality Date  . ABDOMINAL HYSTERECTOMY    . BACK SURGERY    . BIOPSY BREAST     left breast  . BREAST BIOPSY Left     benign   . ROTATOR CUFF REPAIR     bilateral   Family History  Problem Relation Age of Onset  . Hypertension Other        both sides of family  . Heart disease Other   . Cancer Brother        throat  . Heart attack Mother        died at [id number]  . Heart attack Sister        age 74  . Prostate cancer Father    Social History   Socioeconomic History  . Marital status: Married    Spouse name: Not on file  . Number of children: Not on file  . Years of education: Not on file  . Highest education level: Not on file  Occupational History  . Not on file  Social Needs  . Financial resource strain: Not on file  . Food insecurity:    Worry: Not on file    Inability: Not on file  . Transportation needs:    Medical: Not on file    Non-medical: Not on file  Tobacco Use  . Smoking status: Never Smoker  . Smokeless tobacco: Never Used  Substance and Sexual Activity  . Alcohol use: No  . Drug use: No  . Sexual activity: Not on file  Lifestyle  . Physical activity:  Days per week: Not on file    Minutes per session: Not on file  . Stress: Not on file  Relationships  . Social connections:    Talks on phone: Not on file    Gets together: Not on file    Attends religious service: Not on file    Active member of club or organization: Not on file    Attends meetings of clubs or organizations: Not on file    Relationship status: Not on file  Other Topics Concern  . Not on file  Social History Narrative   Retired from [employer]   3 grown children (oldest daughter in [place], Daughter local, Son deceased was murdered)   Married   Enjoys reading, crossword, adult coloring, sewing    No pets    Outpatient Encounter Medications as of 11/20/2018  Medication Sig  . amLODipine (NORVASC) 5 MG tablet TAKE 1 TABLET BY MOUTH  DAILY  . aspirin 81 MG tablet Take 81 mg by mouth daily.  Marland Kitchen atorvastatin (LIPITOR) 10 MG tablet TAKE 1 TABLET BY MOUTH  DAILY  . Calcium Carbonate (CALTRATE 600  PO) Take by mouth daily.  Marland Kitchen gabapentin (NEURONTIN) 300 MG capsule TAKE 1 CAPSULE BY MOUTH AT  BEDTIME  . levothyroxine (SYNTHROID, LEVOTHROID) 50 MCG tablet TAKE ONE TABLET BY MOUTH  ONCE DAILY EXCEPT 1/2  TABLET ON SUNDAYS.  Marland Kitchen losartan (COZAAR) 50 MG tablet TAKE 2 TABLETS BY MOUTH  DAILY  . meclizine (ANTIVERT) 25 MG tablet Take 1 tablet (25 mg total) by mouth 3 (three) times daily as needed for dizziness.  . meloxicam (MOBIC) 7.5 MG tablet Take 1 tablet (7.5 mg total) by mouth daily.  . metoprolol tartrate (LOPRESSOR) 25 MG tablet TAKE 1 TABLET BY MOUTH TWO  TIMES DAILY  . omeprazole (PRILOSEC) 20 MG capsule TAKE 1 CAPSULE BY MOUTH  DAILY   No facility-administered encounter medications on file as of 11/20/2018.     Activities of Daily Living In your present state of health, do you have any difficulty performing the following activities: 11/20/2018  Hearing? N  Vision? N  Difficulty concentrating or making decisions? N  Walking or climbing stairs? N  Dressing or bathing? N  Doing errands, shopping? N  Preparing Food and eating ? N  Using the Toilet? N  In the past six months, have you accidently leaked urine? Y  Comment wears pads when going out.  Do you have problems with loss of bowel control? N  Managing your Medications? N  Managing your Finances? N  Housekeeping or managing your Housekeeping? N  Some recent data might be hidden    Patient Care Team: Sandford Craze, NP as PCP - General (Internal Medicine) Sydnee Cabal., MD as Consulting Physician (Gastroenterology)    Assessment:   This is a routine wellness examination for Nicole Douglas. Physical assessment deferred to PCP.  Exercise Activities and Dietary recommendations Current Exercise Habits: The patient does not participate in regular exercise at present, Exercise limited by: None identified   Diet (meal preparation, eat out, water intake, caffeinated beverages, dairy products, fruits and vegetables): well balanced,  on average, 2-3 meals per day   Goals    . Maintain current health (pt-stated)       Fall Risk Fall Risk  11/20/2018 06/03/2017 04/13/2016 04/02/2015 10/01/2014  Falls in the past year? 0 No No No No     Depression Screen PHQ 2/9 Scores 11/20/2018 06/03/2017 04/13/2016 04/02/2015  PHQ - 2 Score 0 0 0  0  PHQ- 9 Score - 0 - -     Cognitive Function Ad8 score reviewed for issues:  Issues making decisions:no  Less interest in hobbies / activities:no  Repeats questions, stories (family complaining):no  Trouble using ordinary gadgets (microwave, computer, phone):no  Forgets the month or year: no  Mismanaging finances: no  Remembering appts:no  Daily problems with thinking and/or memory:no Ad8 score is=0        Immunization History  Administered Date(s) Administered  . Influenza, High Dose Seasonal PF 04/13/2016, 06/03/2017, 04/21/2018  . Influenza,inj,Quad PF,6+ Mos 03/27/2014, 04/22/2015  . Pneumococcal Conjugate-13 04/02/2015  . Pneumococcal Polysaccharide-23 09/20/2013  . Tdap 03/27/2008   Screening Tests Health Maintenance  Topic Date Due  . Hepatitis C Screening  [birth year]  . DEXA SCAN  03/05/2017  . TETANUS/TDAP  03/27/2018  . MAMMOGRAM  06/03/2018  . INFLUENZA VACCINE  01/20/2019  . COLONOSCOPY  08/19/2020  . PNA vac Low Risk Adult  Completed      Plan:   See you next year.  Continue to eat heart healthy diet (full of fruits, vegetables, whole grains, lean protein, water--limit salt, fat, and sugar intake) and increase physical activity as tolerated.  Continue doing brain stimulating activities (puzzles, reading, adult coloring books, staying active) to keep memory sharp.    I have personally reviewed and noted the following in the patient's chart:   . Medical and social history . Use of alcohol, tobacco or illicit drugs  . Current medications and supplements . Functional ability and status . Nutritional status . Physical activity . Advanced  directives . List of other physicians . Hospitalizations, surgeries, and ER visits in previous 12 months . Vitals . Screenings to include cognitive, depression, and falls . Referrals and appointments  In addition, I have reviewed and discussed with patient certain preventive protocols, quality metrics, and best practice recommendations. A written personalized care plan for preventive services as well as general preventive health recommendations were provided to patient.     [place]on Gully, [place]  11/20/2018  RN note reviewed and agree  Sandford Craze NP

## 2018-11-20 ENCOUNTER — Other Ambulatory Visit: Payer: Self-pay

## 2018-11-20 ENCOUNTER — Encounter: Payer: Self-pay | Admitting: *Deleted

## 2018-11-20 ENCOUNTER — Ambulatory Visit (INDEPENDENT_AMBULATORY_CARE_PROVIDER_SITE_OTHER): Payer: Medicare Other | Admitting: *Deleted

## 2018-11-20 DIAGNOSIS — Z0000 Encounter for general adult medical examination without abnormal findings: Secondary | ICD-10-CM

## 2018-11-20 DIAGNOSIS — Z780 Asymptomatic menopausal state: Secondary | ICD-10-CM

## 2018-11-20 DIAGNOSIS — Z1231 Encounter for screening mammogram for malignant neoplasm of breast: Secondary | ICD-10-CM

## 2018-11-20 NOTE — Patient Instructions (Signed)
See you next year.  Continue to eat heart healthy diet (full of fruits, vegetables, whole grains, lean protein, water--limit salt, fat, and sugar intake) and increase physical activity as tolerated.  Continue doing brain stimulating activities (puzzles, reading, adult coloring books, staying active) to keep memory sharp.    Nicole Douglas , Thank you for taking time to come for your Medicare Wellness Visit. I appreciate your ongoing commitment to your health goals. Please review the following plan we discussed and let me know if I can assist you in the future.   These are the goals we discussed: Goals    . Maintain current health (pt-stated)       This is a list of the screening recommended for you and due dates:  Health Maintenance  Topic Date Due  .  Hepatitis C: One time screening is recommended by Center for Disease Control  (CDC) for  adults born from [birth year] through 1966.   01-01-1946  . DEXA scan (bone density measurement)  03/05/2017  . Tetanus Vaccine  03/27/2018  . Mammogram  06/03/2018  . Flu Shot  01/20/2019  . Colon Cancer Screening  08/19/2020  . Pneumonia vaccines  Completed    Health Maintenance After Age 67 After age 88, you are at a higher risk for certain long-term diseases and infections as well as injuries from falls. Falls are a major cause of broken bones and head injuries in people who are older than age 74. Getting regular preventive care can help to keep you healthy and well. Preventive care includes getting regular testing and making lifestyle changes as recommended by your health care provider. Talk with your health care provider about:  Which screenings and tests you should have. A screening is a test that checks for a disease when you have no symptoms.  A diet and exercise plan that is right for you. What should I know about screenings and tests to prevent falls? Screening and testing are the best ways to find a health problem early. Early diagnosis and  treatment give you the best chance of managing medical conditions that are common after age 74. Certain conditions and lifestyle choices may make you more likely to have a fall. Your health care provider may recommend:  Regular vision checks. Poor vision and conditions such as cataracts can make you more likely to have a fall. If you wear glasses, make sure to get your prescription updated if your vision changes.  Medicine review. Work with your health care provider to regularly review all of the medicines you are taking, including over-the-counter medicines. Ask your health care provider about any side effects that may make you more likely to have a fall. Tell your health care provider if any medicines that you take make you feel dizzy or sleepy.  Osteoporosis screening. Osteoporosis is a condition that causes the bones to get weaker. This can make the bones weak and cause them to break more easily.  Blood pressure screening. Blood pressure changes and medicines to control blood pressure can make you feel dizzy.  Strength and balance checks. Your health care provider may recommend certain tests to check your strength and balance while standing, walking, or changing positions.  Foot health exam. Foot pain and numbness, as well as not wearing proper footwear, can make you more likely to have a fall.  Depression screening. You may be more likely to have a fall if you have a fear of falling, feel emotionally low, or feel unable to do  activities that you used to do.  Alcohol use screening. Using too much alcohol can affect your balance and may make you more likely to have a fall. What actions can I take to lower my risk of falls? General instructions  Talk with your health care provider about your risks for falling. Tell your health care provider if: ? You fall. Be sure to tell your health care provider about all falls, even ones that seem minor. ? You feel dizzy, sleepy, or off-balance.  Take  over-the-counter and prescription medicines only as told by your health care provider. These include any supplements.  Eat a healthy diet and maintain a healthy weight. A healthy diet includes low-fat dairy products, low-fat (lean) meats, and fiber from whole grains, beans, and lots of fruits and vegetables. Home safety  Remove any tripping hazards, such as rugs, cords, and clutter.  Install safety equipment such as grab bars in bathrooms and safety rails on stairs.  Keep rooms and walkways well-lit. Activity   Follow a regular exercise program to stay fit. This will help you maintain your balance. Ask your health care provider what types of exercise are appropriate for you.  If you need a cane or walker, use it as recommended by your health care provider.  Wear supportive shoes that have nonskid soles. Lifestyle  Do not drink alcohol if your health care provider tells you not to drink.  If you drink alcohol, limit how much you have: ? 0-1 drink a day for women. ? 0-2 drinks a day for men.  Be aware of how much alcohol is in your drink. In the [place] one drink equals one typical bottle of beer (12 oz), one-half glass of wine (5 oz), or one shot of hard liquor (1 oz).  Do not use any products that contain nicotine or tobacco, such as cigarettes and e-cigarettes. If you need help quitting, ask your health care provider. Summary  Having a healthy lifestyle and getting preventive care can help to protect your health and wellness after age 74.  Screening and testing are the best way to find a health problem early and help you avoid having a fall. Early diagnosis and treatment give you the best chance for managing medical conditions that are more common for people who are older than age 74.  Falls are a major cause of broken bones and head injuries in people who are older than age 74. Take precautions to prevent a fall at home.  Work with your health care provider to learn what changes  you can make to improve your health and wellness and to prevent falls. This information is not intended to replace advice given to you by your health care provider. Make sure you discuss any questions you have with your health care provider. Document Released: 04/20/2017 Document Revised: 04/20/2017 Document Reviewed: 04/20/2017 Elsevier Interactive Patient Education  2019 [employer].

## 2018-11-21 ENCOUNTER — Other Ambulatory Visit: Payer: Self-pay

## 2018-11-21 ENCOUNTER — Ambulatory Visit (INDEPENDENT_AMBULATORY_CARE_PROVIDER_SITE_OTHER): Payer: Medicare Other | Admitting: Family

## 2018-11-21 DIAGNOSIS — M5416 Radiculopathy, lumbar region: Secondary | ICD-10-CM | POA: Diagnosis not present

## 2018-11-21 MED ORDER — METHYLPREDNISOLONE 4 MG PO TBPK: ORAL_TABLET | ORAL | 0 refills | Status: DC

## 2018-11-21 NOTE — Progress Notes (Signed)
Virtual Visit via Video Note  I connected with Nicole Douglas on 11/21/18 at  9:20 AM EDT by a video enabled telemedicine application and verified that I am speaking with the correct person using two identifiers. This visit type was conducted due to national recommendations for restrictions regarding the COVID-19 Pandemic (e.g. social distancing).  This format is felt to be most appropriate for this patient at this time.   I discussed the limitations of evaluation and management by telemedicine and the availability of in person appointments. The patient expressed understanding and agreed to proceed.  Only the patient and myself were on today's video visit. The patient was at home and I was in my office at the time of today's visit.   History of Present Illness:  Patient is a 73 yr old female who presents today to discuss concern of numbness and burning in both feet.  Reports that the pain is "under her toes."  Reports that it has been present for a while.  Notes that the pain is worse in the right toe than the left.  Denies swelling or redness in the toes. Reports that the sensation is of a thickness when she walks under all of her toes. She reports some mild low back pain but no more than usual (some stiffness). Denies pain shooting down the legs. Denies bowel/bladder incontinence.She does have some leg cramping.   She has seen Dr. Lovell Sheehan in the past for her spine.    Observations/Objective:   Gen: Awake, alert, no acute distress Resp: Breathing is even and non-labored Psych: calm/pleasant demeanor Neuro: Alert and Oriented x 3, + facial symmetry, speech is clear.  Assessment and Plan:  Lumbar radiculopathy/Paresthesia- she describes numbness in the bottoms of her toes/feet consistent with L5 nerve impingement.Will rx with medrol dose pak and arrange follow up with her neurosurgeon.  Follow Up Instructions:    I discussed the assessment and treatment plan with the patient. The patient  was provided an opportunity to ask questions and all were answered. The patient agreed with the plan and demonstrated an understanding of the instructions.   The patient was advised to call back or seek an in-person evaluation if the symptoms worsen or if the condition fails to improve as anticipated.    Lemont Fillers, NP

## 2018-11-30 ENCOUNTER — Telehealth: Payer: Medicare Other | Admitting: Family Medicine

## 2018-12-01 ENCOUNTER — Encounter: Payer: Self-pay | Admitting: Family

## 2018-12-04 MED ORDER — ATORVASTATIN CALCIUM 10 MG PO TABS: 10.0000 mg | ORAL_TABLET | Freq: Every day | ORAL | 1 refills | Status: DC

## 2018-12-04 MED ORDER — GABAPENTIN 300 MG PO CAPS: 300.0000 mg | ORAL_CAPSULE | Freq: Every day | ORAL | 1 refills | Status: DC

## 2018-12-04 MED ORDER — MELOXICAM 7.5 MG PO TABS: 7.5000 mg | ORAL_TABLET | Freq: Every day | ORAL | 1 refills | Status: DC

## 2018-12-04 MED ORDER — LOSARTAN POTASSIUM 50 MG PO TABS: 100.0000 mg | ORAL_TABLET | Freq: Every day | ORAL | 1 refills | Status: DC

## 2018-12-04 MED ORDER — METOPROLOL TARTRATE 25 MG PO TABS: 25.0000 mg | ORAL_TABLET | Freq: Two times a day (BID) | ORAL | 1 refills | Status: DC

## 2018-12-04 NOTE — Telephone Encounter (Signed)
Could you please contact pt to schedule a virtual follow up visit with me?

## 2018-12-06 ENCOUNTER — Ambulatory Visit (INDEPENDENT_AMBULATORY_CARE_PROVIDER_SITE_OTHER): Payer: Medicare Other | Admitting: Family

## 2018-12-06 ENCOUNTER — Encounter: Payer: Self-pay | Admitting: Family

## 2018-12-06 DIAGNOSIS — E039 Hypothyroidism, unspecified: Secondary | ICD-10-CM | POA: Diagnosis not present

## 2018-12-06 DIAGNOSIS — K219 Gastro-esophageal reflux disease without esophagitis: Secondary | ICD-10-CM

## 2018-12-06 DIAGNOSIS — I10 Essential (primary) hypertension: Secondary | ICD-10-CM

## 2018-12-06 DIAGNOSIS — R739 Hyperglycemia, unspecified: Secondary | ICD-10-CM | POA: Diagnosis not present

## 2018-12-06 DIAGNOSIS — M5136 Other intervertebral disc degeneration, lumbar region: Secondary | ICD-10-CM

## 2018-12-06 DIAGNOSIS — E785 Hyperlipidemia, unspecified: Secondary | ICD-10-CM | POA: Diagnosis not present

## 2018-12-06 DIAGNOSIS — R209 Unspecified disturbances of skin sensation: Secondary | ICD-10-CM

## 2018-12-06 MED ORDER — LEVOTHYROXINE SODIUM 50 MCG PO TABS: ORAL_TABLET | ORAL | 1 refills | Status: DC

## 2018-12-06 NOTE — Progress Notes (Signed)
Virtual Visit via Video Note  I connected with Nicole Douglas on 12/06/18 at 11:00 AM EDT by a video enabled telemedicine application and verified that I am speaking with the correct person using two identifiers.  Location: Patient: home Provider: office   I discussed the limitations of evaluation and management by telemedicine and the availability of in person appointments. The patient expressed understanding and agreed to proceed.  History of Present Illness:  Patient is a 73 yr old female who presents today for follow up.  HTN- she reports that is wait for a new bp cuff so has not been checking. She continues losartan, metoprolol and losartan.  Reports mild swelling around the ankles.  BP Readings from Last 3 Encounters:  05/24/18 (!) 150/69  04/21/18 (!) 144/69  02/15/18 126/64   Hypothyroid- reports that she feels well on current dose.  Lab Results  Component Value Date   TSH 1.62 02/15/2018   GERD-she reports occasional gerd symptoms that seem to be related to what she eats. She reports that she is only taking omeprazole prn.   Uses meloxicam prn for back pain and for the numbness in her feet.  Reports that numbness is greater in the right foot than the left foot. She has not yet set up follow up with her neurosurgeon  Hyperlipidemia- continues statin. Denies myalgia.  Lab Results  Component Value Date   CHOL 140 08/19/2017   HDL 45.70 08/19/2017   LDLCALC 73 08/19/2017   TRIG 106.0 08/19/2017   CHOLHDL 3 08/19/2017      Observations/Objective:   Gen: Awake, alert, no acute distress Resp: Breathing is even and non-labored Psych: calm/pleasant demeanor Neuro: Alert and Oriented x 3, + facial symmetry, speech is clear.   Assessment and Plan:  HTN- she will begin checking her blood pressure when she receives her BP cuff and I have asked her to send me her readings via mychart. Continue current meds. She will return for lab work.  Hyperglycemia- we discussed  diabetic diet, exercise and weight loss.  Lab Results  Component Value Date   HGBA1C 6.1 02/15/2018   GERD- stable with prn use of PPI.  Low back pain/LE numbness- improved. Continue prn meloxicam. I have instructed pt to schedule a follow up with her neurosurgeon for ongoing evaluation and she verbalizes understanding.  Hyperlipidemia- obtain follow up lipid panel. Continue statin.   Hypothyroid- stable on synthroid, continue same, obtain follow up tsh.   Follow Up Instructions:    I discussed the assessment and treatment plan with the patient. The patient was provided an opportunity to ask questions and all were answered. The patient agreed with the plan and demonstrated an understanding of the instructions.   The patient was advised to call back or seek an in-person evaluation if the symptoms worsen or if the condition fails to improve as anticipated. Nance Pear, NP

## 2018-12-07 ENCOUNTER — Telehealth: Payer: Self-pay | Admitting: Family

## 2018-12-07 NOTE — Telephone Encounter (Signed)
Return for 2 weeks for lab visit, 3 months for face to face office visit..... Left msg.Marland KitchenMarland Kitchen

## 2019-01-01 ENCOUNTER — Encounter: Payer: Self-pay | Admitting: Family

## 2019-01-17 ENCOUNTER — Encounter: Payer: Self-pay | Admitting: Family

## 2019-01-17 ENCOUNTER — Telehealth: Payer: Self-pay | Admitting: Family

## 2019-01-17 MED ORDER — FLUOCINONIDE 0.05 % EX GEL: 1.0000 "application " | Freq: Two times a day (BID) | CUTANEOUS | 0 refills | Status: DC | PRN

## 2019-01-17 NOTE — Telephone Encounter (Signed)
Please advise 

## 2019-01-17 NOTE — Telephone Encounter (Signed)
Patient stated that she cannot afford the fluocinonide gel (LIDEX) 0.05 % medication because it cost $82.  Please advise.

## 2019-01-17 NOTE — Telephone Encounter (Signed)
This is [place] pt 

## 2019-01-17 NOTE — Telephone Encounter (Signed)
Oops sorry

## 2019-01-19 ENCOUNTER — Other Ambulatory Visit: Payer: Self-pay | Admitting: Family

## 2019-02-12 ENCOUNTER — Encounter: Payer: Self-pay | Admitting: Family

## 2019-02-13 NOTE — Telephone Encounter (Signed)
Please give pt number for imaging- there is already an order for mammogram and dexa in the system so she can schedule on day of her choice.

## 2019-03-05 ENCOUNTER — Other Ambulatory Visit: Payer: Self-pay

## 2019-03-06 ENCOUNTER — Ambulatory Visit (HOSPITAL_BASED_OUTPATIENT_CLINIC_OR_DEPARTMENT_OTHER)
Admission: RE | Admit: 2019-03-06 | Discharge: 2019-03-06 | Disposition: A | Discharge status: Home / Self Care | Payer: Medicare Other | Source: Ambulatory Visit | Attending: Family | Admitting: Family

## 2019-03-06 ENCOUNTER — Ambulatory Visit (INDEPENDENT_AMBULATORY_CARE_PROVIDER_SITE_OTHER): Payer: Medicare Other | Admitting: Family

## 2019-03-06 ENCOUNTER — Encounter (HOSPITAL_BASED_OUTPATIENT_CLINIC_OR_DEPARTMENT_OTHER): Payer: Self-pay

## 2019-03-06 ENCOUNTER — Encounter: Payer: Self-pay | Admitting: Family

## 2019-03-06 VITALS — BP 152/82 | HR 62 | Temp 96.6°F | Resp 16 | Ht 67.0 in | Wt 186.0 lb

## 2019-03-06 DIAGNOSIS — Z23 Encounter for immunization: Secondary | ICD-10-CM | POA: Diagnosis not present

## 2019-03-06 DIAGNOSIS — Z780 Asymptomatic menopausal state: Secondary | ICD-10-CM

## 2019-03-06 DIAGNOSIS — I10 Essential (primary) hypertension: Secondary | ICD-10-CM | POA: Diagnosis not present

## 2019-03-06 DIAGNOSIS — E038 Other specified hypothyroidism: Secondary | ICD-10-CM

## 2019-03-06 DIAGNOSIS — Z1231 Encounter for screening mammogram for malignant neoplasm of breast: Secondary | ICD-10-CM | POA: Insufficient documentation

## 2019-03-06 DIAGNOSIS — E785 Hyperlipidemia, unspecified: Secondary | ICD-10-CM | POA: Diagnosis not present

## 2019-03-06 DIAGNOSIS — Z13820 Encounter for screening for osteoporosis: Secondary | ICD-10-CM | POA: Diagnosis not present

## 2019-03-06 MED ORDER — ATORVASTATIN CALCIUM 10 MG PO TABS: 10.0000 mg | ORAL_TABLET | Freq: Every day | ORAL | 1 refills | Status: DC

## 2019-03-06 NOTE — Patient Instructions (Addendum)
Please complete lab work prior to leaving. Please check your blood pressure once daily at home for the next 3-4 days and then send me your readings via mychart.

## 2019-03-06 NOTE — Progress Notes (Signed)
Subjective:    Patient ID: Nicole Douglas, female    DOB: 17-Jun-1946, 73 y.o.   MRN: [id number]  HPI  Patient is a 73 yr old female who presents today for follow up.  HTN- Maintained on amlodipine losartan and metoprolol. She reports that she is only taken 1 pill this morning.  She reports that her home blood pressure readings remain 979 or less systolic.   BP Readings from Last 3 Encounters:  03/06/19 (!) 152/82  05/24/18 (!) 150/69  04/21/18 (!) 144/69   Hyperlipidemia- maintained on lipitor. Lab Results  Component Value Date   CHOL 140 08/19/2017   HDL 45.70 08/19/2017   LDLCALC 73 08/19/2017   TRIG 106.0 08/19/2017   CHOLHDL 3 08/19/2017   Lumbar radiculopathy- did not follow up with the neurosurgeon.  She is maintained on meloxicam and reports that her symptoms are currently stable.  Review of Systems    see HPI  Past Medical History:  Diagnosis Date  . Hyperglycemia   . Hyperlipemia   . Hypertension   . Thyroid disease      Social History   Socioeconomic History  . Marital status: Married    Spouse name: Not on file  . Number of children: Not on file  . Years of education: Not on file  . Highest education level: Not on file  Occupational History  . Not on file  Social Needs  . Financial resource strain: Not on file  . Food insecurity    Worry: Not on file    Inability: Not on file  . Transportation needs    Medical: Not on file    Non-medical: Not on file  Tobacco Use  . Smoking status: Never Smoker  . Smokeless tobacco: Never Used  Substance and Sexual Activity  . Alcohol use: No  . Drug use: No  . Sexual activity: Not on file  Lifestyle  . Physical activity    Days per week: Not on file    Minutes per session: Not on file  . Stress: Not on file  Relationships  . Social Herbalist on phone: Not on file    Gets together: Not on file    Attends religious service: Not on file    Active member of club or organization: Not on file     Attends meetings of clubs or organizations: Not on file    Relationship status: Not on file  . Intimate partner violence    Fear of current or ex partner: Not on file    Emotionally abused: Not on file    Physically abused: Not on file    Forced sexual activity: Not on file  Other Topics Concern  . Not on file  Social History Narrative   Retired from [employer]   3 grown children (oldest daughter in [place], Daughter local, Son deceased was murdered)   Married   Enjoys reading, crossword, adult coloring, sewing    No pets    Past Surgical History:  Procedure Laterality Date  . ABDOMINAL HYSTERECTOMY    . BACK SURGERY    . BIOPSY BREAST     left breast  . BREAST BIOPSY Left    benign   . ROTATOR CUFF REPAIR     bilateral    Family History  Problem Relation Age of Onset  . Hypertension Other        both sides of family  . Heart disease Other   . Cancer Brother  throat  . Heart attack Mother        died at 7472  . Heart attack Sister        age 74  . Prostate cancer Father     No Known Allergies  Current Outpatient Medications on File Prior to Visit  Medication Sig Dispense Refill  . amLODipine (NORVASC) 5 MG tablet TAKE 1 TABLET BY MOUTH  DAILY 90 tablet 1  . aspirin 81 MG tablet Take 81 mg by mouth daily.    . Calcium Carbonate (CALTRATE 600 PO) Take by mouth daily.    . fluocinonide gel (LIDEX) 0.05 % Apply 1 application topically 2 (two) times daily as needed. To scalp 60 g 0  . gabapentin (NEURONTIN) 300 MG capsule Take 1 capsule (300 mg total) by mouth at bedtime. 90 capsule 1  . levothyroxine (SYNTHROID) 50 MCG tablet TAKE 1 TABLET BY MOUTH ONCE DAILY EXCEPT 1/2 TABLET ON  SUNDAYS 83 tablet 1  . losartan (COZAAR) 50 MG tablet Take 2 tablets (100 mg total) by mouth daily. 180 tablet 1  . meclizine (ANTIVERT) 25 MG tablet Take 1 tablet (25 mg total) by mouth 3 (three) times daily as needed for dizziness. 30 tablet 0  . meloxicam (MOBIC) 7.5 MG tablet  Take 1 tablet (7.5 mg total) by mouth daily. 90 tablet 1  . metoprolol tartrate (LOPRESSOR) 25 MG tablet Take 1 tablet (25 mg total) by mouth 2 (two) times daily. 180 tablet 1  . omeprazole (PRILOSEC) 20 MG capsule TAKE 1 CAPSULE BY MOUTH  DAILY 90 capsule 1   No current facility-administered medications on file prior to visit.     BP (!) 152/82   Pulse 62   Temp (!) 96.6 F (35.9 C) (Temporal)   Resp 16   Ht 5\' 7"  (1.702 m)   Wt 186 lb (84.4 kg)   SpO2 99%   BMI 29.13 kg/m    Objective:   Physical Exam Constitutional:      Appearance: She is well-developed.  Neck:     Musculoskeletal: Neck supple.     Thyroid: No thyromegaly.  Cardiovascular:     Rate and Rhythm: Normal rate and regular rhythm.     Heart sounds: Normal heart sounds. No murmur.  Pulmonary:     Effort: Pulmonary effort is normal. No respiratory distress.     Breath sounds: Normal breath sounds. No wheezing.  Skin:    General: Skin is warm and dry.  Neurological:     Mental Status: She is alert and oriented to person, place, and time.  Psychiatric:        Behavior: Behavior normal.        Thought Content: Thought content normal.        Judgment: Judgment normal.           Assessment & Plan:  Hypertension-initial blood pressure upon arrival today was 173/74.  Follow-up blood pressure with manual blood pressure cuff was 152/82.  I have asked her to continue current medications and to check her home readings once daily for the next 3 to 4 days and send me her readings via my chart.  High-dose flu shot today.  Hypothyroid- she has maintained on Synthroid.  Plan follow-up TSH today.  Hyperlipidemia-maintained on statin.  Will obtain follow-up lipid panel.

## 2019-03-08 ENCOUNTER — Encounter: Payer: Self-pay | Admitting: Family

## 2019-03-09 NOTE — Telephone Encounter (Signed)
BP Readings from Last 3 Encounters:  03/06/19 (!) 152/82  05/24/18 (!) 150/69  04/21/18 (!) 144/69

## 2019-03-15 ENCOUNTER — Ambulatory Visit: Payer: Self-pay | Admitting: *Deleted

## 2019-03-15 NOTE — Telephone Encounter (Signed)
Began to experience soreness and cramps in both lower leg anteriorly. Feels slightly warm to the touch. Worked outside most of day yesterday. Had only about 3 cups of water, no more than her usual. Cramping in the area woke her up during the night. Tried vinegar and mustard to alleviate. Denies CP and SOB. Sore to touch now. Appears some swelling in the shin area about the size of a quarter. Encouraged increase water intake and stretching her legs. Could try OTC Potassium supplement and or bananas. Any SOB or CP seek immediate evaluation. Routing to provider for any further advice.   Reason for Disposition . [1] Caused by muscle cramps in the thigh, calf, or foot AND [2] present < 1 hour (brief, now gone)  Answer Assessment - Initial Assessment Questions 1. ONSET: "When did the pain start?"      Last night 2. LOCATION: "Where is the pain located?"     Bilateral lower shins 3. PAIN: "How bad is the pain?"    (Scale 1-10; or mild, moderate, severe)   -  MILD (1-3): doesn't interfere with normal activities    -  MODERATE (4-7): interferes with normal activities (e.g., work or school) or awakens from sleep, limping    -  SEVERE (8-10): excruciating pain, unable to do any normal activities, unable to walk     Moderate-has her walking like Dance movement psychotherapist .  4. WORK OR EXERCISE: "Has there been any recent work or exercise that involved this part of the body?"      Worked outdoors most of day yesterday.  5. CAUSE: "What do you think is causing the leg pain?"     unsure 6. OTHER SYMPTOMS: "Do you have any other symptoms?" (e.g., chest pain, back pain, breathing difficulty, swelling, rash, fever, numbness, weakness)     none 7. PREGNANCY: "Is there any chance you are pregnant?" "When was your last menstrual period?"     na  Protocols used: LEG PAIN-A-AH

## 2019-03-16 NOTE — Telephone Encounter (Signed)
Lm on both numbers listed for patient for her to call us back and let us know how she is doing today.

## 2019-03-19 NOTE — Telephone Encounter (Signed)
Called patient again today she is still having bruising and pain on her legs. Denies any injury, sob or severe swelling. Per Mackie Pai ok to be evaluated tomorrow am. Appt. Was schedule for him to see her at 8 am.

## 2019-03-19 NOTE — Telephone Encounter (Signed)
Also asked to clarify no popliteal pain. Will see pt tomorrow.

## 2019-03-20 ENCOUNTER — Ambulatory Visit: Payer: Medicare Other | Admitting: Medical

## 2019-03-20 ENCOUNTER — Encounter: Payer: Self-pay | Admitting: Medical

## 2019-03-20 ENCOUNTER — Other Ambulatory Visit: Payer: Self-pay

## 2019-03-20 ENCOUNTER — Ambulatory Visit (HOSPITAL_BASED_OUTPATIENT_CLINIC_OR_DEPARTMENT_OTHER)
Admission: RE | Admit: 2019-03-20 | Discharge: 2019-03-20 | Disposition: A | Discharge status: Home / Self Care | Payer: Medicare Other | Source: Ambulatory Visit | Attending: Medical | Admitting: Medical

## 2019-03-20 ENCOUNTER — Ambulatory Visit (INDEPENDENT_AMBULATORY_CARE_PROVIDER_SITE_OTHER): Payer: Medicare Other | Admitting: Medical

## 2019-03-20 VITALS — BP 137/64 | HR 67 | Temp 96.6°F | Resp 16 | Ht 67.0 in | Wt 184.4 lb

## 2019-03-20 DIAGNOSIS — M79661 Pain in right lower leg: Secondary | ICD-10-CM

## 2019-03-20 DIAGNOSIS — M79662 Pain in left lower leg: Secondary | ICD-10-CM

## 2019-03-20 DIAGNOSIS — I809 Phlebitis and thrombophlebitis of unspecified site: Secondary | ICD-10-CM

## 2019-03-20 DIAGNOSIS — M7989 Other specified soft tissue disorders: Secondary | ICD-10-CM | POA: Diagnosis not present

## 2019-03-20 DIAGNOSIS — R252 Cramp and spasm: Secondary | ICD-10-CM

## 2019-03-20 DIAGNOSIS — M79605 Pain in left leg: Secondary | ICD-10-CM | POA: Diagnosis not present

## 2019-03-20 DIAGNOSIS — T148XXA Other injury of unspecified body region, initial encounter: Secondary | ICD-10-CM

## 2019-03-20 DIAGNOSIS — M79604 Pain in right leg: Secondary | ICD-10-CM | POA: Diagnosis not present

## 2019-03-20 LAB — CBC WITH DIFFERENTIAL/PLATELET
Basophils Absolute: 0.1 10*3/uL (ref 0.0–0.1)
Basophils Relative: 0.7 % (ref 0.0–3.0)
Eosinophils Absolute: 0.1 10*3/uL (ref 0.0–0.7)
Eosinophils Relative: 1.9 % (ref 0.0–5.0)
HCT: 38.2 % (ref 36.0–46.0)
Hemoglobin: 12.9 g/dL (ref 12.0–15.0)
Lymphocytes Relative: 26.4 % (ref 12.0–46.0)
Lymphs Abs: 1.9 10*3/uL (ref 0.7–4.0)
MCHC: 33.7 g/dL (ref 30.0–36.0)
MCV: 85.1 fl (ref 78.0–100.0)
Monocytes Absolute: 0.7 10*3/uL (ref 0.1–1.0)
Monocytes Relative: 9.7 % (ref 3.0–12.0)
Neutro Abs: 4.3 10*3/uL (ref 1.4–7.7)
Neutrophils Relative %: 61.3 % (ref 43.0–77.0)
Platelets: 203 10*3/uL (ref 150.0–400.0)
RBC: 4.49 Mil/uL (ref 3.87–5.11)
RDW: 15.1 % (ref 11.5–15.5)
WBC: 7.1 10*3/uL (ref 4.0–10.5)

## 2019-03-20 LAB — COMPREHENSIVE METABOLIC PANEL
ALT: 31 U/L (ref 0–35)
AST: 18 U/L (ref 0–37)
Albumin: 4.3 g/dL (ref 3.5–5.2)
Alkaline Phosphatase: 156 U/L — ABNORMAL HIGH (ref 39–117)
BUN: 13 mg/dL (ref 6–23)
CO2: 31 mEq/L (ref 19–32)
Calcium: 9.6 mg/dL (ref 8.4–10.5)
Chloride: 103 mEq/L (ref 96–112)
Creatinine, Ser: 0.92 mg/dL (ref 0.40–1.20)
GFR: 72.07 mL/min (ref >60.00)
Glucose, Bld: 139 mg/dL — ABNORMAL HIGH (ref 70–99)
Potassium: 4.3 mEq/L (ref 3.5–5.1)
Sodium: 141 mEq/L (ref 135–145)
Total Bilirubin: 0.9 mg/dL (ref 0.2–1.2)
Total Protein: 7.3 g/dL (ref 6.0–8.3)

## 2019-03-20 LAB — MAGNESIUM: Magnesium: 2.1 mg/dL (ref 1.5–2.5)

## 2019-03-20 MED ORDER — CEPHALEXIN 500 MG PO CAPS: 500.0000 mg | ORAL_CAPSULE | Freq: Two times a day (BID) | ORAL | 0 refills | Status: DC

## 2019-03-20 NOTE — Progress Notes (Signed)
Subjective:    Patient ID: Nicole Douglas, female    DOB: 1946/01/26, 73 y.o.   MRN: [id number]  HPI  Pt in with slight bruising. This started around sept 23rd. She also notes bruise to left upper arm. No trauma or fall.  Pain both pretibial area. She thinks areas bruised. On close inspection appears some superficial varicose veins.  Pt states some occasional cramping to her calfs. Some pain in calfs also at times.   Some pain in bottom of feet in past associated with lumbar back pain. + finding in mri. Pt is on gabapentin.    Review of Systems  Constitutional: Negative for chills, fatigue and fever.  Respiratory: Negative for cough, chest tightness, shortness of breath and wheezing.   Cardiovascular: Negative for chest pain and palpitations.  Gastrointestinal: Negative for abdominal pain.  Musculoskeletal: Positive for back pain.       Rt popliteal pain, bilateral shin/tibia pain.  Skin: Negative for rash.  Neurological:       Pain on bottom of feet. Pt told nerve pain from back pain in past. Hx of but not worse than baseline pain.  Hematological: Negative for adenopathy. Bruises/bleeds easily.       Left arm but some reported pretibial area.  Psychiatric/Behavioral: Negative for behavioral problems, decreased concentration and hallucinations.    Past Medical History:  Diagnosis Date  . Hyperglycemia   . Hyperlipemia   . Hypertension   . Thyroid disease      Social History   Socioeconomic History  . Marital status: Married    Spouse name: Not on file  . Number of children: Not on file  . Years of education: Not on file  . Highest education level: Not on file  Occupational History  . Not on file  Social Needs  . Financial resource strain: Not on file  . Food insecurity    Worry: Not on file    Inability: Not on file  . Transportation needs    Medical: Not on file    Non-medical: Not on file  Tobacco Use  . Smoking status: Never Smoker  . Smokeless tobacco:  Never Used  Substance and Sexual Activity  . Alcohol use: No  . Drug use: No  . Sexual activity: Not on file  Lifestyle  . Physical activity    Days per week: Not on file    Minutes per session: Not on file  . Stress: Not on file  Relationships  . Social Herbalist on phone: Not on file    Gets together: Not on file    Attends religious service: Not on file    Active member of club or organization: Not on file    Attends meetings of clubs or organizations: Not on file    Relationship status: Not on file  . Intimate partner violence    Fear of current or ex partner: Not on file    Emotionally abused: Not on file    Physically abused: Not on file    Forced sexual activity: Not on file  Other Topics Concern  . Not on file  Social History Narrative   Retired from [employer]   3 grown children (oldest daughter in [place], Daughter local, Son deceased was murdered)   Married   Enjoys reading, crossword, adult coloring, sewing    No pets    Past Surgical History:  Procedure Laterality Date  . ABDOMINAL HYSTERECTOMY    . BACK SURGERY    .  BIOPSY BREAST     left breast  . BREAST BIOPSY Left    benign   . ROTATOR CUFF REPAIR     bilateral    Family History  Problem Relation Age of Onset  . Hypertension Other        both sides of family  . Heart disease Other   . Cancer Brother        throat  . Heart attack Mother        died at 50  . Heart attack Sister        age 25  . Prostate cancer Father     No Known Allergies  Current Outpatient Medications on File Prior to Visit  Medication Sig Dispense Refill  . amLODipine (NORVASC) 5 MG tablet TAKE 1 TABLET BY MOUTH  DAILY 90 tablet 1  . aspirin 81 MG tablet Take 81 mg by mouth daily.    Marland Kitchen atorvastatin (LIPITOR) 10 MG tablet Take 1 tablet (10 mg total) by mouth daily. 90 tablet 1  . Calcium Carbonate (CALTRATE 600 PO) Take by mouth daily.    . fluocinonide gel (LIDEX) 0.05 % Apply 1 application topically 2  (two) times daily as needed. To scalp 60 g 0  . gabapentin (NEURONTIN) 300 MG capsule Take 1 capsule (300 mg total) by mouth at bedtime. 90 capsule 1  . levothyroxine (SYNTHROID) 50 MCG tablet TAKE 1 TABLET BY MOUTH ONCE DAILY EXCEPT 1/2 TABLET ON  SUNDAYS 83 tablet 1  . losartan (COZAAR) 50 MG tablet Take 2 tablets (100 mg total) by mouth daily. 180 tablet 1  . meclizine (ANTIVERT) 25 MG tablet Take 1 tablet (25 mg total) by mouth 3 (three) times daily as needed for dizziness. 30 tablet 0  . meloxicam (MOBIC) 7.5 MG tablet Take 1 tablet (7.5 mg total) by mouth daily. 90 tablet 1  . metoprolol tartrate (LOPRESSOR) 25 MG tablet Take 1 tablet (25 mg total) by mouth 2 (two) times daily. 180 tablet 1  . omeprazole (PRILOSEC) 20 MG capsule TAKE 1 CAPSULE BY MOUTH  DAILY 90 capsule 1   No current facility-administered medications on file prior to visit.     BP 137/64   Pulse 67   Temp (!) 96.6 F (35.9 C) (Temporal)   Resp 16   Ht 5\' 7"  (1.702 m)   Wt 184 lb 6.4 oz (83.6 kg)   SpO2 100%   BMI 28.88 kg/m       Objective:   Physical Exam  General- No acute distress. Pleasant patient. Neck- Full range of motion, no jvd Lungs- Clear, even and unlabored. Heart- regular rate and rhythm. Neurologic- CNII- XII grossly intact. Back- no cva tenderness. Abdomen- soft, nt,nd, +bs. Faint suprapubic tenderness.  Rt lower ext- faint + homans sign. Rt pretibial area mild indurated area with slight visible vein in area of tenderness. Left lower ext- mid tibia. Area of tenderness and induration. Vein visible at center. Left arm- bruised area just below shoulder.     Assessment & Plan:  For your calf pains both legs will get lower ext . You are scheduled for 5:30 pm.  For superficial veins inflamed and faint warmth to skin. Will treat for superficial phlebitis and cover for skin nfection with rx keflex. If [place] + then plan would change.  For calf cramps cmp and mg level.  For recent brusing  will get cbc/check platelets.  For neuropathy lower ext, continue with your gabapentin. Make sure you take every night.  Follow up in 7-10 days or as needed  [phone] or [phone]  25+ minutes spent with pt. 50% of time spent counseling pt on plan going forward  Whole FoodsEdward Daryle Boyington, VF CorporationPA-C

## 2019-03-20 NOTE — Patient Instructions (Addendum)
For your calf pains both legs will get lower ext [place]. You are scheduled for 5:30 pm.  For superficial veins inflamed and faint warmth to skin. Will treat for superficial phlebitis(advise warmth compress twice daily and use mobic) and cover for skin infection with rx keflex. If [place] + then plan would change.  For calf cramps cmp and mg level.  For recent brusing will get cbc/check platelets.  For neuropathy lower ext, continue with your gabapentin. Make sure you take every night.  Follow up in 7-10 days or as needed

## 2019-03-24 IMAGING — DX DG HIP (WITH OR WITHOUT PELVIS) 2-3V*L*
3 series · 3 of 3 positions shown · non-contrast
Comparison: None

CLINICAL DATA: Anterior LEFT hip pain since [REDACTED] increased when
trying to stand from a seated position

EXAM:
DG HIP (WITH OR WITHOUT PELVIS) 2-3V LEFT

[pelvis ap]
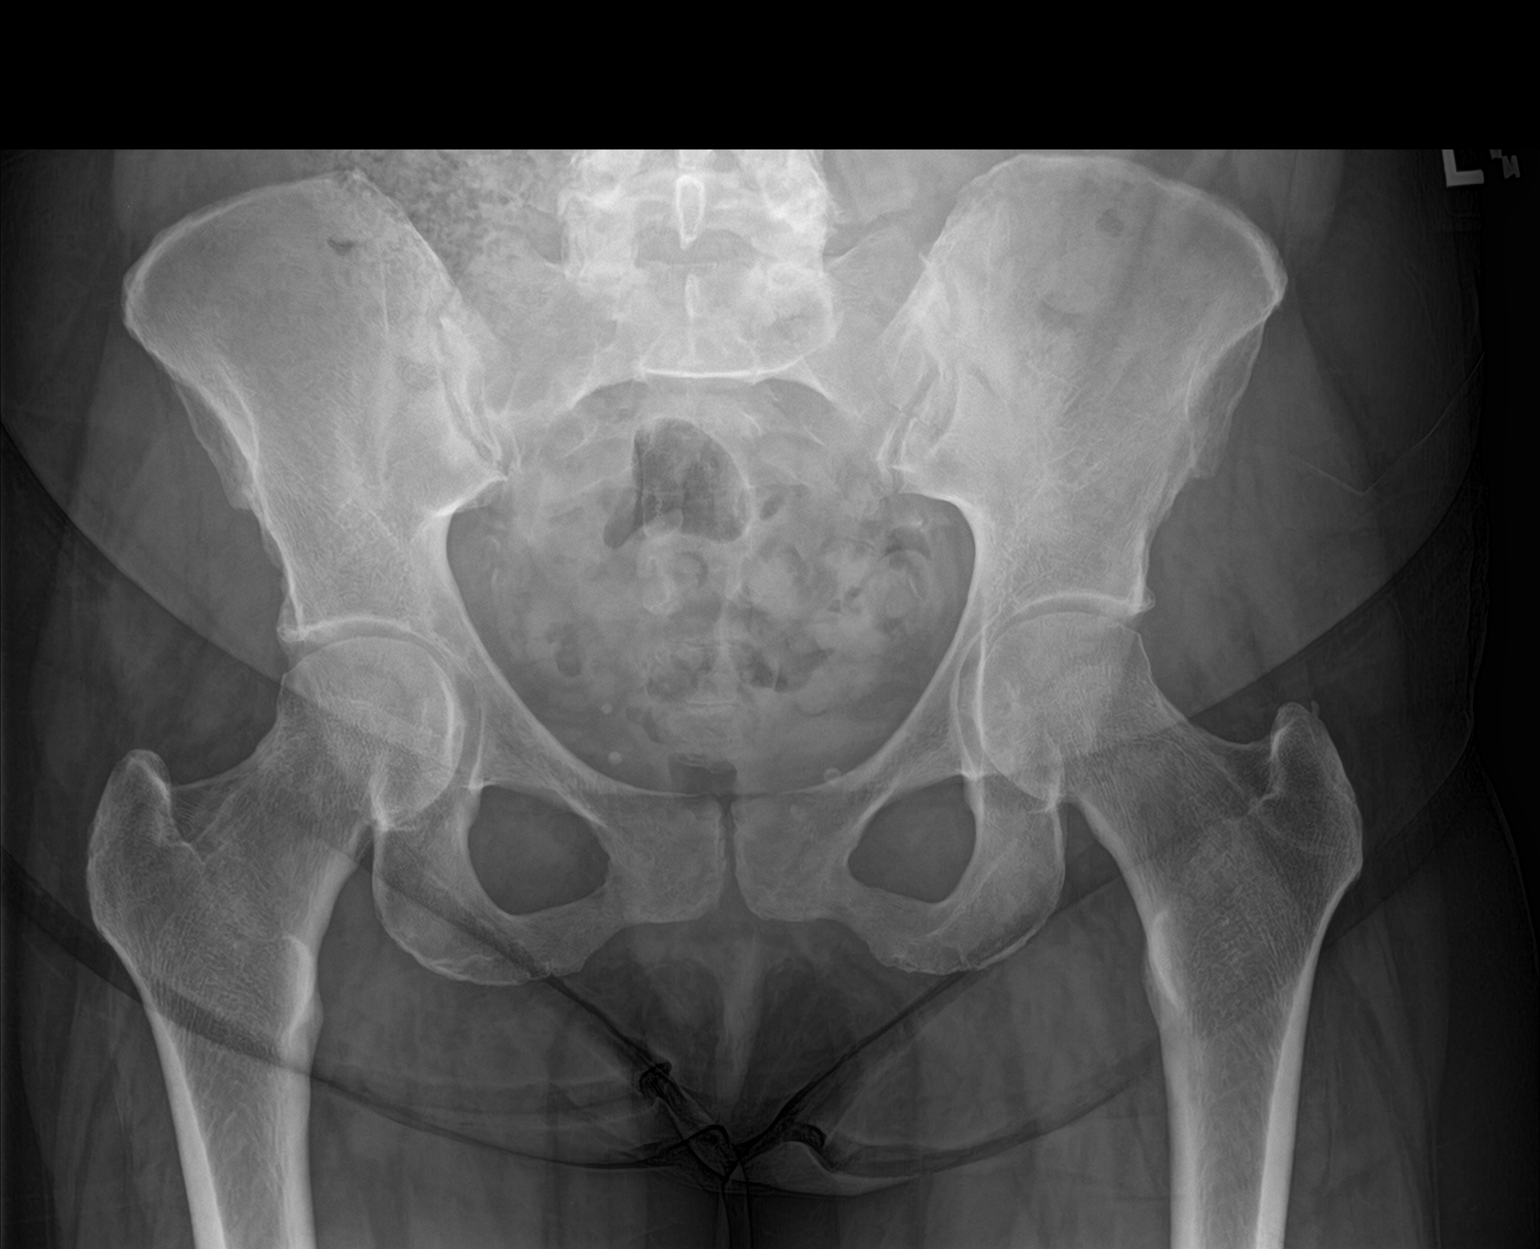

[hip ap]
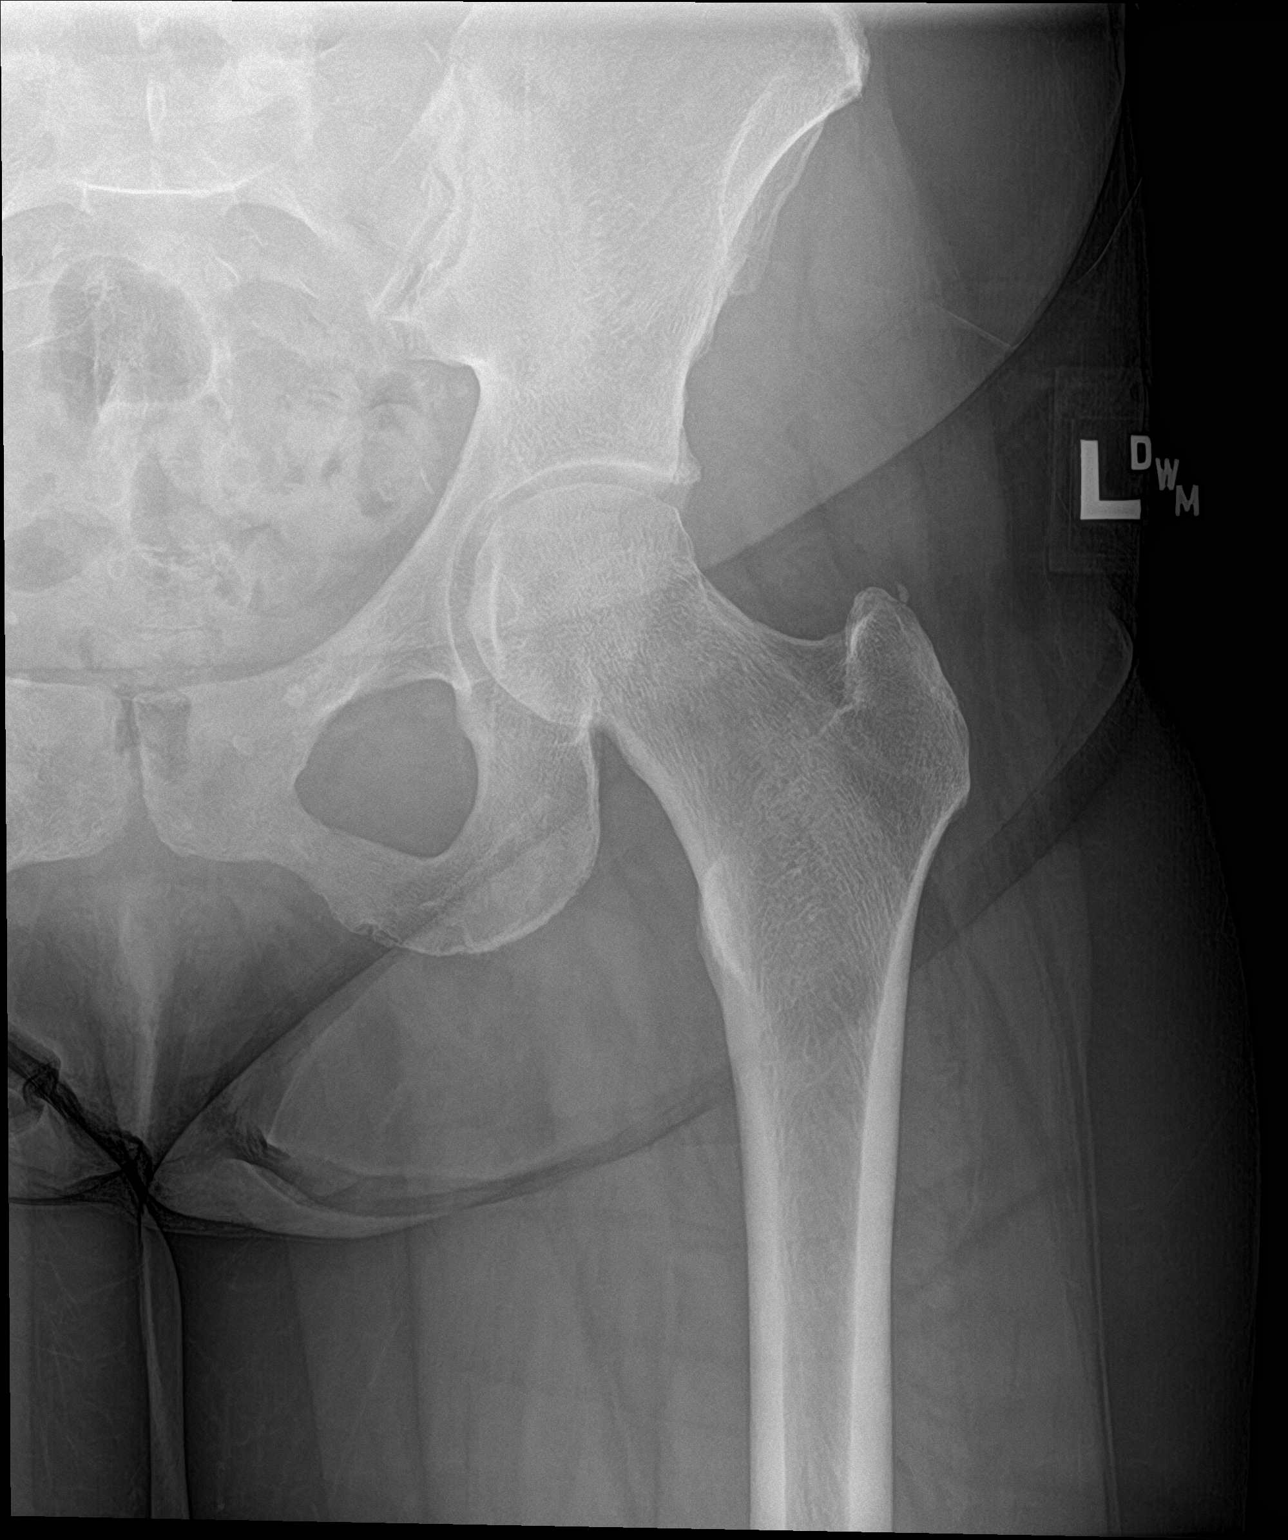

[hip lat]
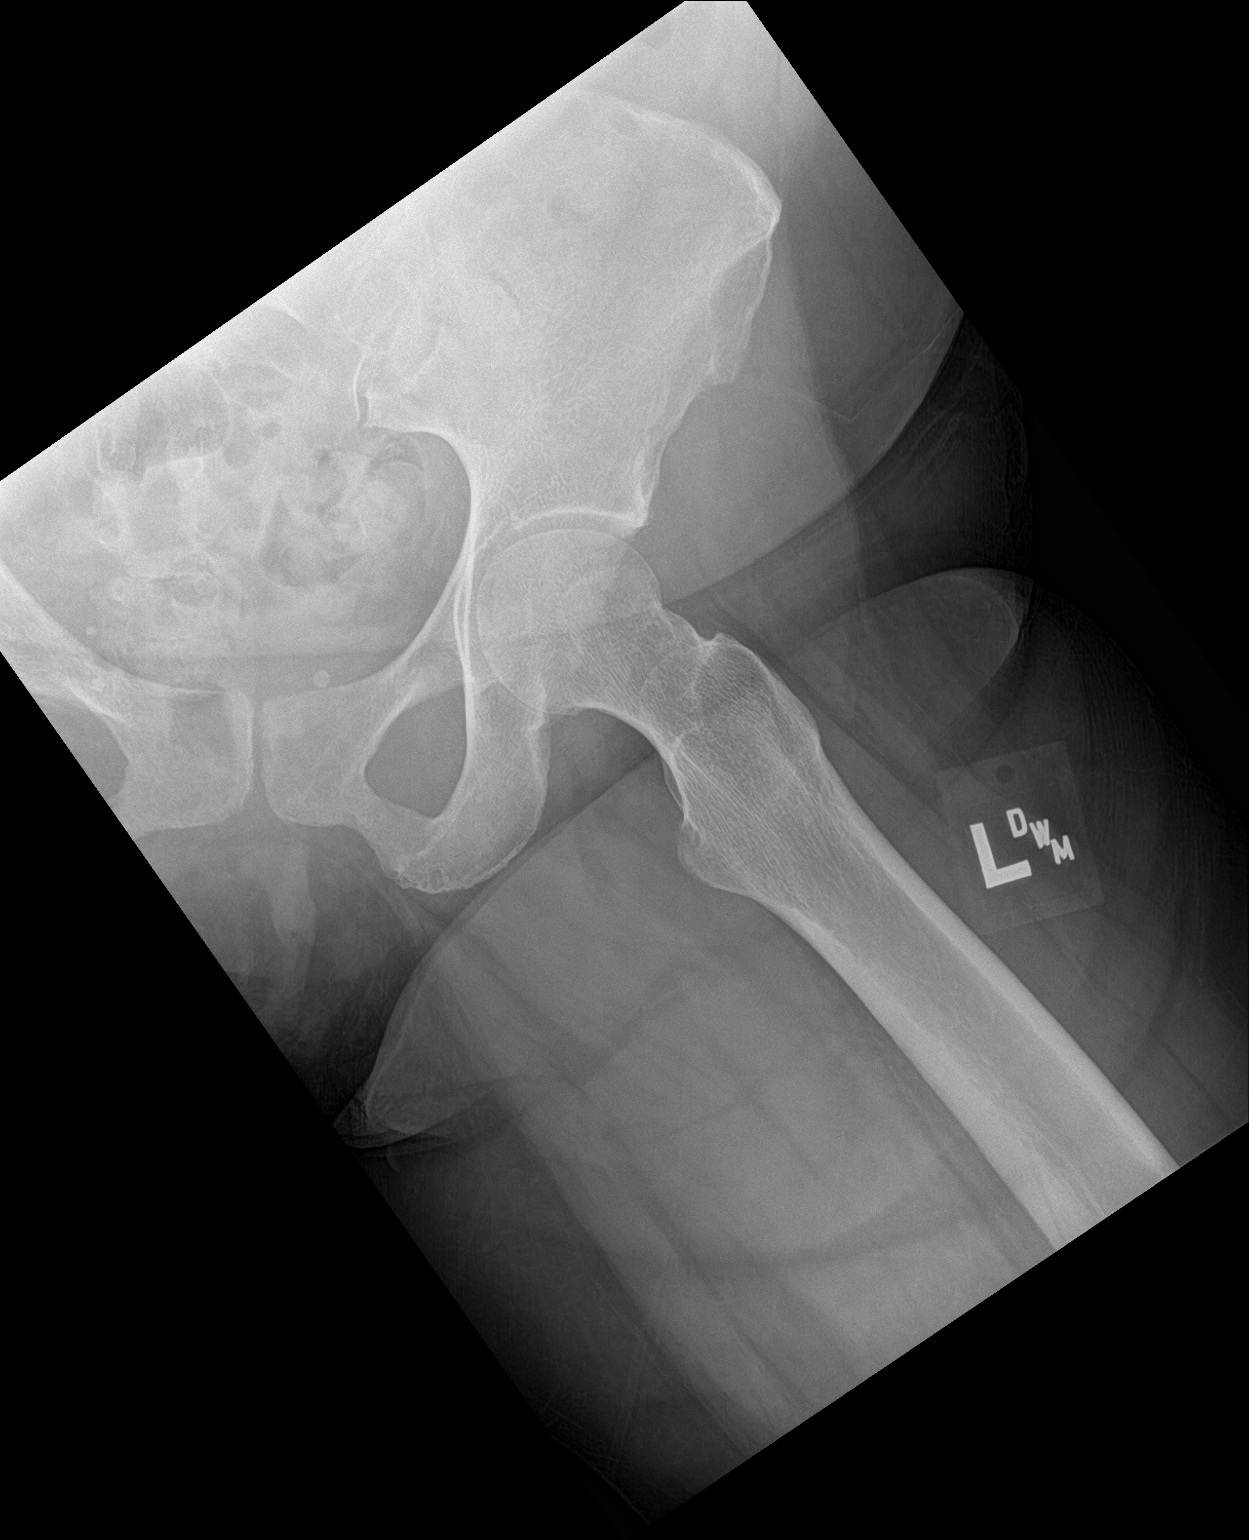

[3 of 3 positions shown; findings below may reference images not displayed]

FINDINGS: Low normal osseous mineralization.

Hip and SI joint spaces preserved.

No acute fracture, dislocation, or bone destruction.

Few scattered pelvic phleboliths.
IMPRESSION: No acute abnormalities.

## 2019-03-24 IMAGING — DX DG LUMBAR SPINE COMPLETE 4+V
5 series · 5 of 5 positions shown · non-contrast
Comparison: 01/18/2017

CLINICAL DATA: Low back pain radiating to LEFT side since [REDACTED],
no known injury

EXAM:
LUMBAR SPINE - COMPLETE 4+ VIEW

[l-spine ap]
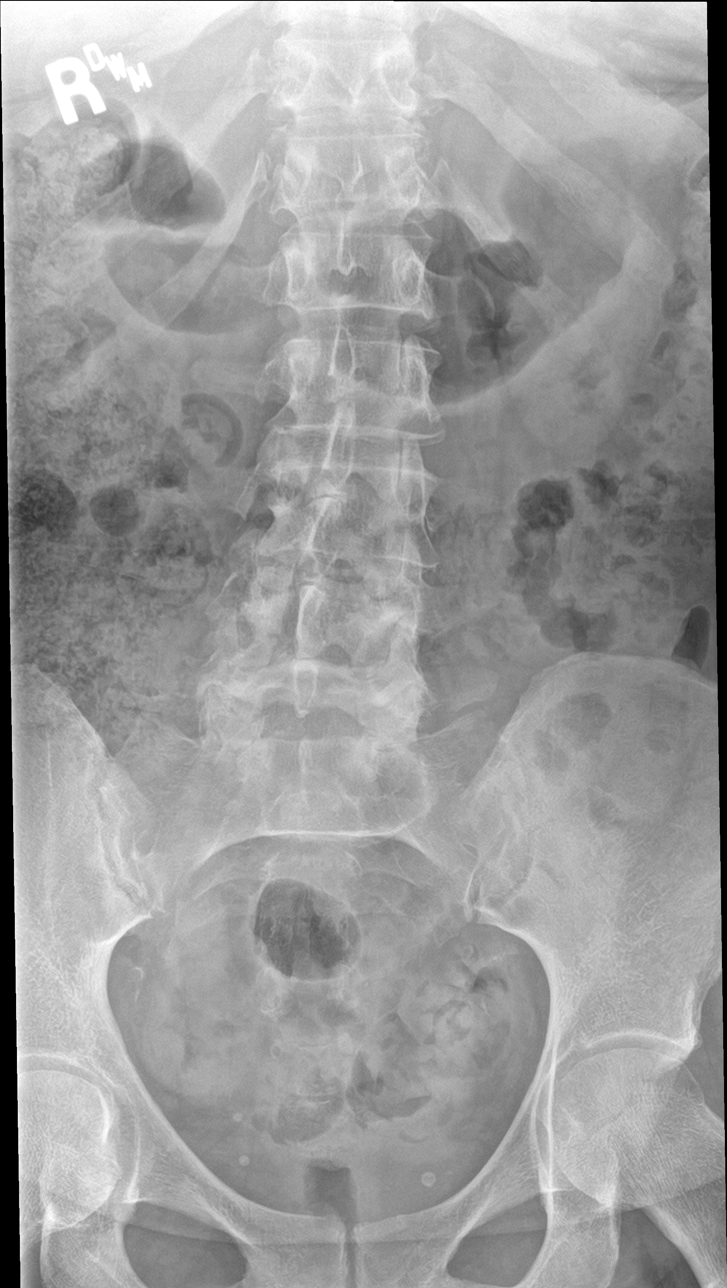

[l-spine obl (1 of 2)]
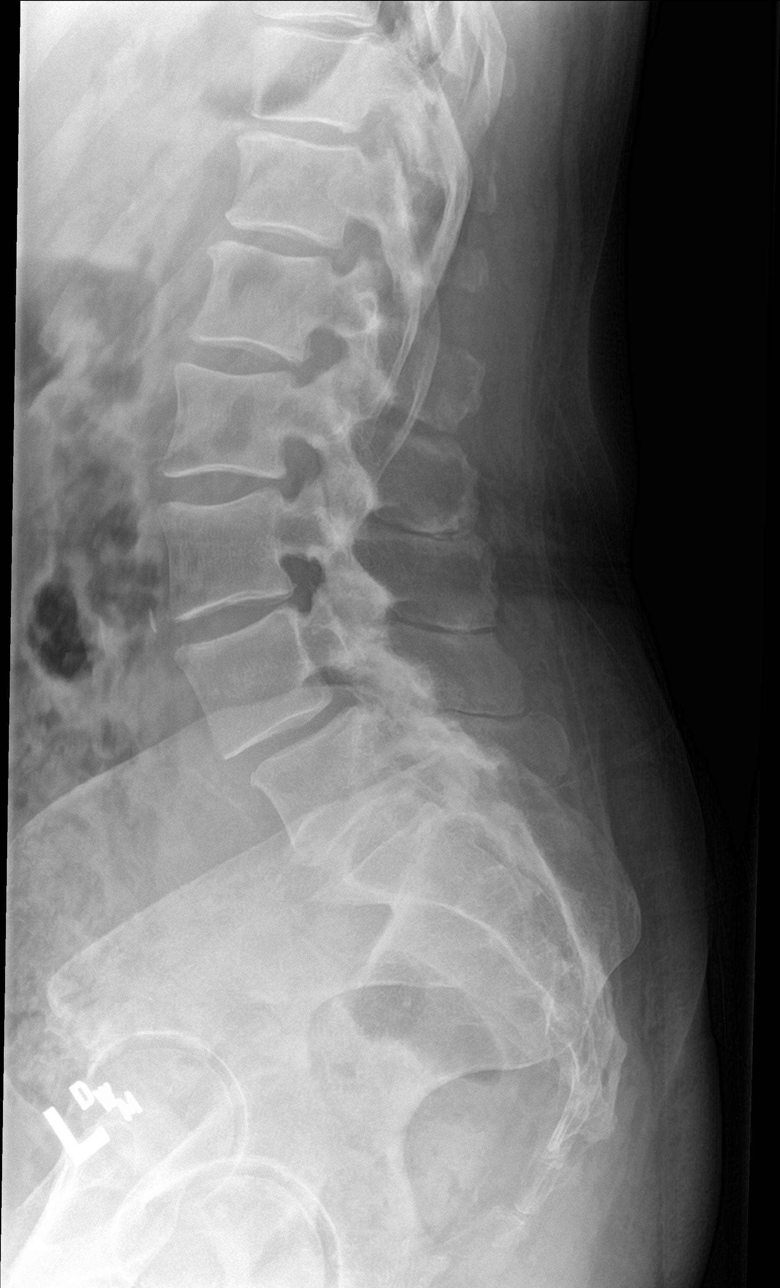

[l-spine obl (2 of 2)]
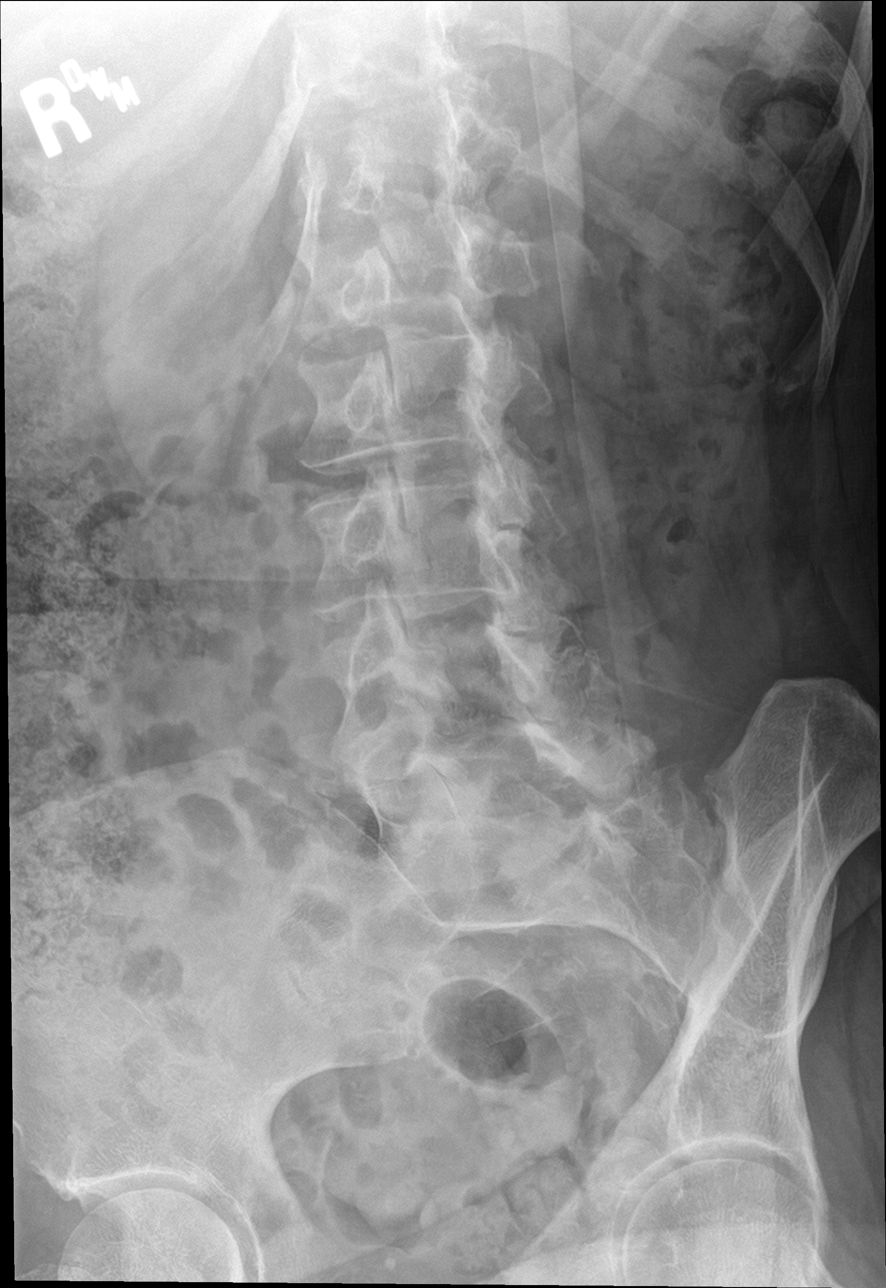

[l-spine lat]
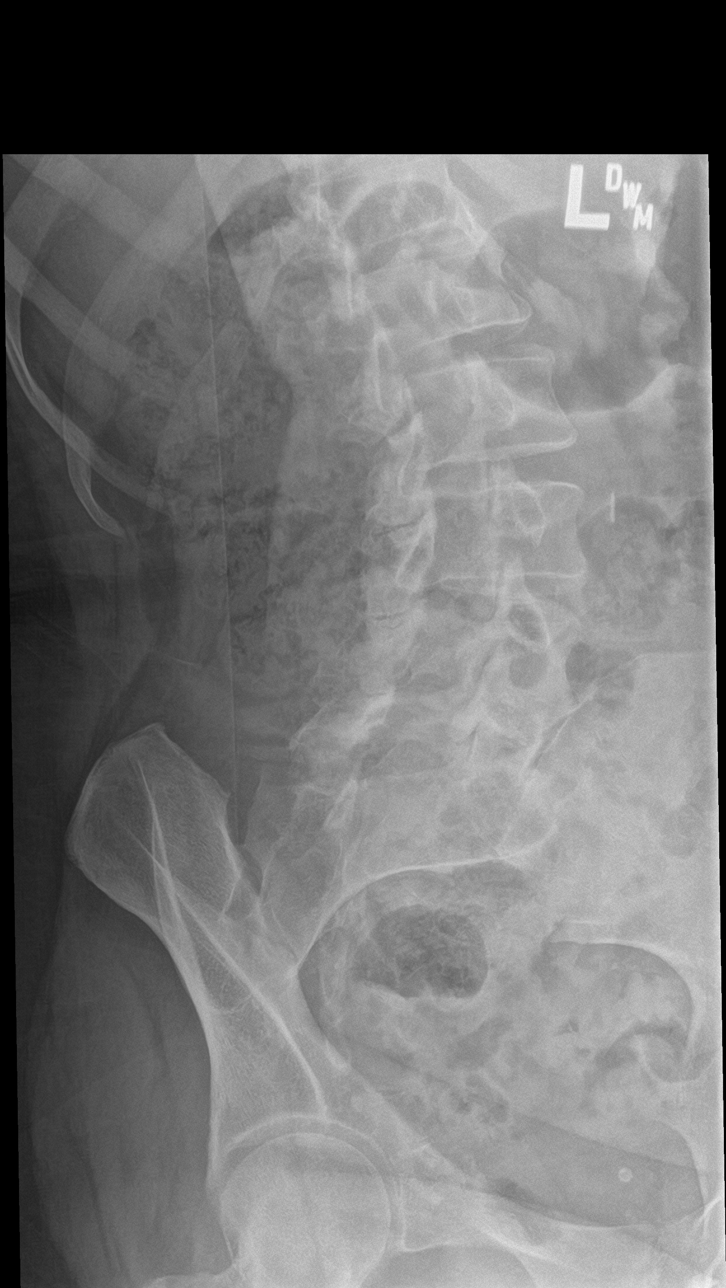

[l-spine spot]
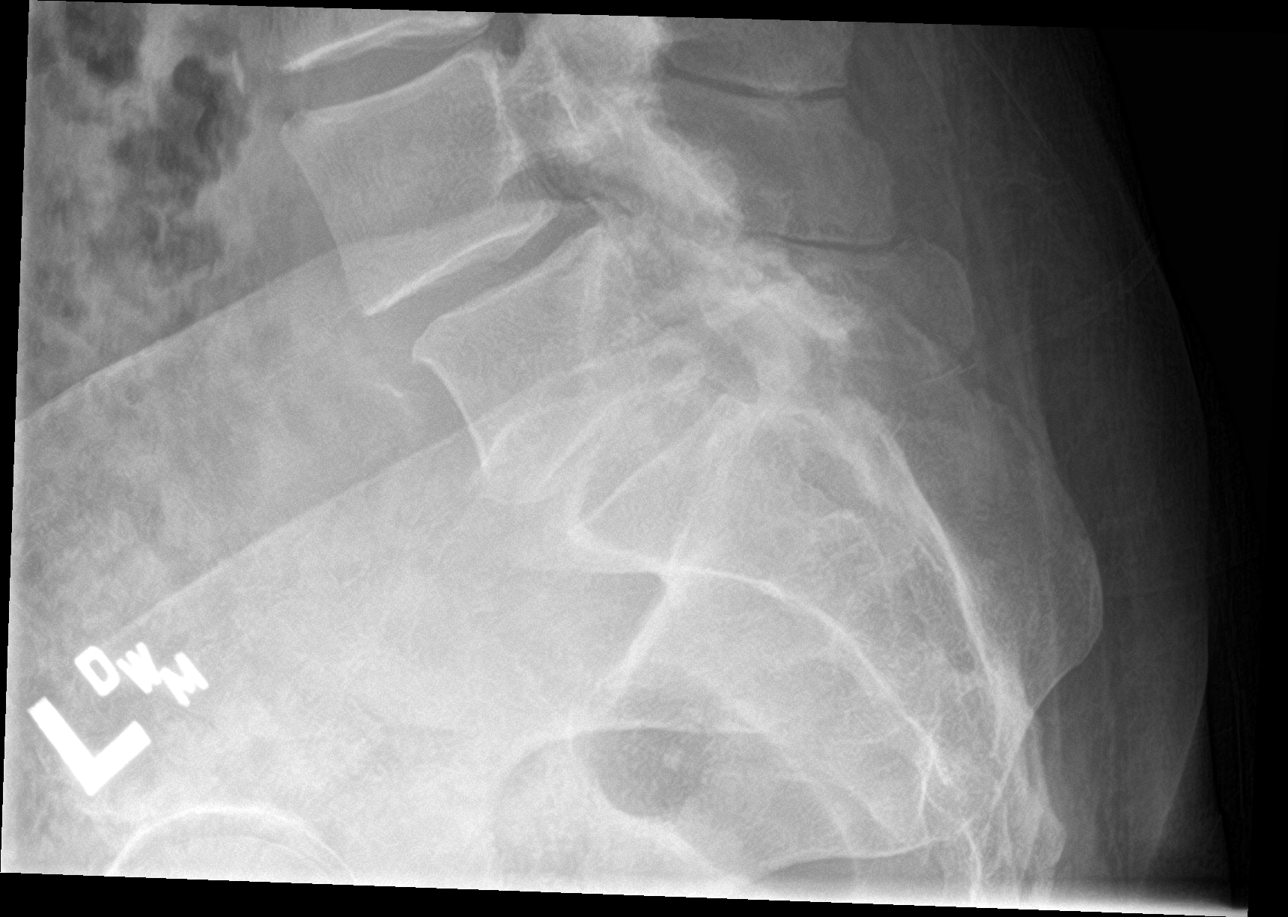

[5 of 5 positions shown; findings below may reference images not displayed]

FINDINGS: Five non-rib-bearing lumbar vertebra.

Mild broad-based levoconvex thoracolumbar scoliosis.

Multilevel facet degenerative changes of the mid to lower lumbar
spine.

Disc space narrowing greatest at L4-L5, also slightly greater at
T12-L1.

Grade 1 anterolisthesis L4-L5 likely degenerative in origin.

Vertebral body and disc space heights otherwise maintained.

No acute fracture, additional subluxation, or bone destruction.

No spondylolysis.

SI joints preserved.
IMPRESSION: Degenerative disc and facet disease changes of the lumbar spine with
grade 1 spondylolisthesis of L4-L5, likely degenerative in origin.

No acute abnormalities.

## 2019-04-13 ENCOUNTER — Encounter: Payer: Self-pay | Admitting: Family

## 2019-04-24 ENCOUNTER — Other Ambulatory Visit: Payer: Self-pay | Admitting: Family

## 2019-06-08 ENCOUNTER — Ambulatory Visit: Payer: Medicare Other | Admitting: Family

## 2019-07-11 DIAGNOSIS — H52203 Unspecified astigmatism, bilateral: Secondary | ICD-10-CM | POA: Diagnosis not present

## 2019-07-11 DIAGNOSIS — H524 Presbyopia: Secondary | ICD-10-CM | POA: Diagnosis not present

## 2019-07-11 DIAGNOSIS — H2513 Age-related nuclear cataract, bilateral: Secondary | ICD-10-CM | POA: Diagnosis not present

## 2019-07-11 DIAGNOSIS — H40053 Ocular hypertension, bilateral: Secondary | ICD-10-CM | POA: Diagnosis not present

## 2019-07-11 DIAGNOSIS — H25013 Cortical age-related cataract, bilateral: Secondary | ICD-10-CM | POA: Diagnosis not present

## 2019-08-01 ENCOUNTER — Other Ambulatory Visit: Payer: Self-pay | Admitting: Family

## 2019-08-10 DIAGNOSIS — H2512 Age-related nuclear cataract, left eye: Secondary | ICD-10-CM | POA: Diagnosis not present

## 2019-08-22 DIAGNOSIS — Z01812 Encounter for preprocedural laboratory examination: Secondary | ICD-10-CM | POA: Diagnosis not present

## 2019-08-22 DIAGNOSIS — Z20822 Contact with and (suspected) exposure to covid-19: Secondary | ICD-10-CM | POA: Diagnosis not present

## 2019-08-22 DIAGNOSIS — H2513 Age-related nuclear cataract, bilateral: Secondary | ICD-10-CM | POA: Diagnosis not present

## 2019-08-28 DIAGNOSIS — H2512 Age-related nuclear cataract, left eye: Secondary | ICD-10-CM | POA: Diagnosis not present

## 2019-08-28 DIAGNOSIS — H25012 Cortical age-related cataract, left eye: Secondary | ICD-10-CM | POA: Diagnosis not present

## 2019-08-30 ENCOUNTER — Other Ambulatory Visit: Payer: Self-pay

## 2019-08-30 ENCOUNTER — Encounter: Payer: Self-pay | Admitting: Family

## 2019-08-30 MED ORDER — LEVOTHYROXINE SODIUM 50 MCG PO TABS: ORAL_TABLET | ORAL | 1 refills | Status: DC

## 2019-10-31 ENCOUNTER — Telehealth: Payer: Self-pay | Admitting: Family

## 2019-10-31 ENCOUNTER — Other Ambulatory Visit: Payer: Self-pay | Admitting: Family

## 2019-10-31 NOTE — Telephone Encounter (Signed)
Lvm at home number and cell for patient to call for follow up appointment.

## 2019-10-31 NOTE — Telephone Encounter (Signed)
Please contact pt to schedule appointment.  

## 2019-11-01 NOTE — Telephone Encounter (Signed)
Patient advised she needed appointment, she was scheduled for 11-07-2019

## 2019-11-07 ENCOUNTER — Ambulatory Visit (INDEPENDENT_AMBULATORY_CARE_PROVIDER_SITE_OTHER): Payer: Medicare Other | Admitting: Family

## 2019-11-07 ENCOUNTER — Encounter: Payer: Self-pay | Admitting: Family

## 2019-11-07 ENCOUNTER — Other Ambulatory Visit: Payer: Self-pay

## 2019-11-07 VITALS — BP 146/72 | HR 63 | Temp 97.0°F | Resp 16 | Ht 67.0 in | Wt 184.0 lb

## 2019-11-07 DIAGNOSIS — R42 Dizziness and giddiness: Secondary | ICD-10-CM

## 2019-11-07 DIAGNOSIS — E039 Hypothyroidism, unspecified: Secondary | ICD-10-CM | POA: Diagnosis not present

## 2019-11-07 DIAGNOSIS — I10 Essential (primary) hypertension: Secondary | ICD-10-CM | POA: Diagnosis not present

## 2019-11-07 DIAGNOSIS — E785 Hyperlipidemia, unspecified: Secondary | ICD-10-CM | POA: Diagnosis not present

## 2019-11-07 DIAGNOSIS — R739 Hyperglycemia, unspecified: Secondary | ICD-10-CM | POA: Diagnosis not present

## 2019-11-07 DIAGNOSIS — E118 Type 2 diabetes mellitus with unspecified complications: Secondary | ICD-10-CM

## 2019-11-07 LAB — TSH: TSH: 1.44 u[IU]/mL (ref 0.35–4.50)

## 2019-11-07 LAB — COMPREHENSIVE METABOLIC PANEL
ALT: 28 U/L (ref 0–35)
AST: 18 U/L (ref 0–37)
Albumin: 4.3 g/dL (ref 3.5–5.2)
Alkaline Phosphatase: 143 U/L — ABNORMAL HIGH (ref 39–117)
BUN: 16 mg/dL (ref 6–23)
CO2: 28 mEq/L (ref 19–32)
Calcium: 9.4 mg/dL (ref 8.4–10.5)
Chloride: 105 mEq/L (ref 96–112)
Creatinine, Ser: 1.04 mg/dL (ref 0.40–1.20)
GFR: 62.46 mL/min (ref >60.00)
Glucose, Bld: 199 mg/dL — ABNORMAL HIGH (ref 70–99)
Potassium: 4.1 mEq/L (ref 3.5–5.1)
Sodium: 141 mEq/L (ref 135–145)
Total Bilirubin: 0.7 mg/dL (ref 0.2–1.2)
Total Protein: 7.1 g/dL (ref 6.0–8.3)

## 2019-11-07 LAB — LIPID PANEL
Cholesterol: 161 mg/dL (ref 0–200)
HDL: 48.8 mg/dL (ref >39.00)
LDL Cholesterol: 88 mg/dL (ref 0–99)
NonHDL: 111.96
Total CHOL/HDL Ratio: 3
Triglycerides: 119 mg/dL (ref 0.0–149.0)
VLDL: 23.8 mg/dL (ref 0.0–40.0)

## 2019-11-07 LAB — HEMOGLOBIN A1C: Hgb A1c MFr Bld: 6.9 % — ABNORMAL HIGH (ref 4.6–6.5)

## 2019-11-07 MED ORDER — LOSARTAN POTASSIUM 50 MG PO TABS: 100.0000 mg | ORAL_TABLET | Freq: Every day | ORAL | 0 refills | Status: DC

## 2019-11-07 MED ORDER — ATORVASTATIN CALCIUM 10 MG PO TABS: 10.0000 mg | ORAL_TABLET | Freq: Every day | ORAL | 0 refills | Status: DC

## 2019-11-07 MED ORDER — CELECOXIB 200 MG PO CAPS
200.0000 mg | ORAL_CAPSULE | Freq: Every day | ORAL | 0 refills | Status: DC
Start: 2019-11-07 — End: 2020-05-12

## 2019-11-07 MED ORDER — GABAPENTIN 300 MG PO CAPS: 300.0000 mg | ORAL_CAPSULE | Freq: Every day | ORAL | 1 refills | Status: DC

## 2019-11-07 NOTE — Progress Notes (Signed)
Subjective:    Patient ID: Nicole Douglas, female    DOB: Jun 28, 1945, 74 y.o.   MRN: [id number]  HPI  Patient is a 74 yr old female who presents today for follow up.  Hypothyroid- maintained on synthroid.   Lab Results  Component Value Date   TSH 1.62 02/15/2018   Numbness- c/o numbness/tingling on both hands. Reports that it happen throughout the day.  Denies neck pain.   HTN- maintained on losartan, metoprolol and amlodipine.  BP Readings from Last 3 Encounters:  11/07/19 (!) 146/72  03/20/19 137/64  03/06/19 (!) 152/82   Reports occasional mild vertigo- improved by prn antivert.  Lab Results  Component Value Date   HGBA1C 6.1 02/15/2018    Review of Systems See HPI  Past Medical History:  Diagnosis Date  . Hyperglycemia   . Hyperlipemia   . Hypertension   . Thyroid disease      Social History   Socioeconomic History  . Marital status: Married    Spouse name: Not on file  . Number of children: Not on file  . Years of education: Not on file  . Highest education level: Not on file  Occupational History  . Not on file  Tobacco Use  . Smoking status: Never Smoker  . Smokeless tobacco: Never Used  Substance and Sexual Activity  . Alcohol use: No  . Drug use: No  . Sexual activity: Not on file  Other Topics Concern  . Not on file  Social History Narrative   Retired from [employer]   3 grown children (oldest daughter in [place], Daughter local, Son deceased was murdered)   Married   Enjoys reading, crossword, adult coloring, sewing    No pets   Social Determinants of Corporate investment banker Strain:   . Difficulty of Paying Living Expenses:   Food Insecurity:   . Worried About Programme researcher, broadcasting/film/video in the Last Year:   . Barista in the Last Year:   Transportation Needs:   . Freight forwarder (Medical):   Marland Kitchen Lack of Transportation (Non-Medical):   Physical Activity:   . Days of Exercise per Week:   . Minutes of Exercise per Session:     Stress:   . Feeling of Stress :   Social Connections:   . Frequency of Communication with Friends and Family:   . Frequency of Social Gatherings with Friends and Family:   . Attends Religious Services:   . Active Member of Clubs or Organizations:   . Attends Banker Meetings:   Marland Kitchen Marital Status:   Intimate Partner Violence:   . Fear of Current or Ex-Partner:   . Emotionally Abused:   Marland Kitchen Physically Abused:   . Sexually Abused:     Past Surgical History:  Procedure Laterality Date  . ABDOMINAL HYSTERECTOMY    . BACK SURGERY    . BIOPSY BREAST     left breast  . BREAST BIOPSY Left    benign   . ROTATOR CUFF REPAIR     bilateral    Family History  Problem Relation Age of Onset  . Hypertension Other        both sides of family  . Heart disease Other   . Cancer Brother        throat  . Heart attack Mother        died at 33  . Heart attack Sister  age 53  . Prostate cancer Father     No Known Allergies  Current Outpatient Medications on File Prior to Visit  Medication Sig Dispense Refill  . amLODipine (NORVASC) 5 MG tablet TAKE 1 TABLET BY MOUTH  DAILY 90 tablet 1  . aspirin 81 MG tablet Take 81 mg by mouth daily.    . Calcium Carbonate (CALTRATE 600 PO) Take by mouth daily.    Marland Kitchen levothyroxine (SYNTHROID) 50 MCG tablet TAKE 1 TABLET BY MOUTH ONCE DAILY EXCEPT 1/2 TABLET ON  SUNDAYS 83 tablet 1  . meclizine (ANTIVERT) 25 MG tablet Take 1 tablet (25 mg total) by mouth 3 (three) times daily as needed for dizziness. 30 tablet 0  . meloxicam (MOBIC) 7.5 MG tablet Take 1 tablet (7.5 mg total) by mouth daily. 90 tablet 1  . metoprolol tartrate (LOPRESSOR) 25 MG tablet TAKE 1 TABLET BY MOUTH  TWICE DAILY 180 tablet 1  . omeprazole (PRILOSEC) 20 MG capsule TAKE 1 CAPSULE BY MOUTH  DAILY 90 capsule 1   No current facility-administered medications on file prior to visit.    BP (!) 146/72 (BP Location: Right Arm, Patient Position: Sitting, Cuff Size: Small)    Pulse 63   Temp (!) 97 F (36.1 C) (Temporal)   Resp 16   Ht 5\' 7"  (1.702 m)   Wt 184 lb (83.5 kg)   SpO2 99%   BMI 28.82 kg/m       Objective:   Physical Exam Constitutional:      Appearance: She is well-developed.  Neck:     Thyroid: No thyromegaly.  Cardiovascular:     Rate and Rhythm: Normal rate and regular rhythm.     Heart sounds: Normal heart sounds. No murmur.  Pulmonary:     Effort: Pulmonary effort is normal. No respiratory distress.     Breath sounds: Normal breath sounds. No wheezing.  Musculoskeletal:     Cervical back: Neck supple.  Skin:    General: Skin is warm and dry.  Neurological:     Mental Status: She is alert and oriented to person, place, and time.     Comments: Neg tinels, neg phalans  Psychiatric:        Behavior: Behavior normal.        Thought Content: Thought content normal.        Judgment: Judgment normal.           Assessment & Plan:  Hand numbness- ? Carpal tunnel. Trial of NSAID. She is to let me know if her symptoms worsen or if they do not improve in the next few weeks and I will plan referral to neurology.  Hypothyroid- clinically stable on synthroid. Continue same, obtain follow up TSH  Lab Results  Component Value Date   TSH 1.44 11/07/2019   Hyperglycemia- now in the diabetes range, continue diet, exercise and weight loss.  Lab Results  Component Value Date   HGBA1C 6.9 (H) 11/07/2019   HGBA1C 6.1 02/15/2018   HGBA1C 6.0 08/19/2017   Lab Results  Component Value Date   LDLCALC 88 11/07/2019   CREATININE 1.04 11/07/2019   Vertigo- mild. Continue prn antivert.  This visit occurred during the SARS-CoV-2 public health emergency.  Safety protocols were in place, including screening questions prior to the visit, additional usage of staff PPE, and extensive cleaning of exam room while observing appropriate contact time as indicated for disinfecting solutions.

## 2019-11-08 ENCOUNTER — Encounter: Payer: Self-pay | Admitting: Family

## 2019-11-08 ENCOUNTER — Telehealth: Payer: Self-pay | Admitting: Family

## 2019-11-09 NOTE — Telephone Encounter (Signed)
Opened in error

## 2019-11-22 ENCOUNTER — Ambulatory Visit: Payer: Medicare Other | Admitting: *Deleted

## 2019-12-07 ENCOUNTER — Ambulatory Visit: Payer: Medicare Other | Admitting: *Deleted

## 2020-01-25 ENCOUNTER — Other Ambulatory Visit: Payer: Self-pay | Admitting: Family

## 2020-01-27 ENCOUNTER — Other Ambulatory Visit: Payer: Self-pay | Admitting: Family

## 2020-02-04 ENCOUNTER — Telehealth: Payer: Self-pay | Admitting: Family

## 2020-02-04 NOTE — Telephone Encounter (Signed)
Caller: Keslyn Call back # [phone]  Patient needs a letter stating she is not able to attend jury duty, Patient would like a letter to be mailed to [employer] [address] [place] [address]

## 2020-02-04 NOTE — Telephone Encounter (Signed)
Pt states to make her letter to the ATTENTION of : Excuse Request ,  Patient jury number is # [phone]

## 2020-02-04 NOTE — Telephone Encounter (Signed)
Patient requesting letter to be excused for Jury duty due to vertigo, and back pain when sitting for a long time.

## 2020-02-05 ENCOUNTER — Encounter: Payer: Self-pay | Admitting: Family

## 2020-02-05 NOTE — Telephone Encounter (Signed)
Letter has been completed. I sent her a copy via mychart. Please notify pt.

## 2020-02-06 NOTE — Telephone Encounter (Signed)
Letter printed out and mailed out to patient at her request

## 2020-02-18 ENCOUNTER — Encounter: Payer: Self-pay | Admitting: Family

## 2020-02-18 ENCOUNTER — Ambulatory Visit (HOSPITAL_BASED_OUTPATIENT_CLINIC_OR_DEPARTMENT_OTHER)
Admission: RE | Admit: 2020-02-18 | Discharge: 2020-02-18 | Disposition: A | Discharge status: Home / Self Care | Payer: Medicare Other | Source: Ambulatory Visit | Attending: Family | Admitting: Family

## 2020-02-18 ENCOUNTER — Other Ambulatory Visit: Payer: Self-pay

## 2020-02-18 DIAGNOSIS — R600 Localized edema: Secondary | ICD-10-CM

## 2020-02-18 NOTE — Telephone Encounter (Signed)
Patient advised of [place] appointment date and time

## 2020-02-18 NOTE — Telephone Encounter (Signed)
Please advise pt that I reviewed her photos and I would like her to have a lower extremity ultrasound this afternoon at 2:30 in imaging. This is to rule out blood clot.

## 2020-02-18 NOTE — Telephone Encounter (Signed)
I spoke to pt. She is going to send me photos from the other day via mychart. Notes swelling is some better now.

## 2020-03-03 ENCOUNTER — Other Ambulatory Visit: Payer: Self-pay | Admitting: Family

## 2020-05-12 ENCOUNTER — Encounter: Payer: Self-pay | Admitting: Family

## 2020-05-12 ENCOUNTER — Ambulatory Visit (INDEPENDENT_AMBULATORY_CARE_PROVIDER_SITE_OTHER): Payer: Medicare Other | Admitting: Family

## 2020-05-12 ENCOUNTER — Other Ambulatory Visit: Payer: Self-pay

## 2020-05-12 VITALS — BP 164/72 | HR 99 | Temp 97.5°F | Resp 16 | Ht 66.5 in | Wt 183.0 lb

## 2020-05-12 DIAGNOSIS — E119 Type 2 diabetes mellitus without complications: Secondary | ICD-10-CM | POA: Diagnosis not present

## 2020-05-12 DIAGNOSIS — R232 Flushing: Secondary | ICD-10-CM

## 2020-05-12 DIAGNOSIS — E348 Other specified endocrine disorders: Secondary | ICD-10-CM | POA: Diagnosis not present

## 2020-05-12 DIAGNOSIS — M545 Low back pain, unspecified: Secondary | ICD-10-CM

## 2020-05-12 DIAGNOSIS — E038 Other specified hypothyroidism: Secondary | ICD-10-CM

## 2020-05-12 DIAGNOSIS — E785 Hyperlipidemia, unspecified: Secondary | ICD-10-CM

## 2020-05-12 DIAGNOSIS — I10 Essential (primary) hypertension: Secondary | ICD-10-CM

## 2020-05-12 DIAGNOSIS — Z0000 Encounter for general adult medical examination without abnormal findings: Secondary | ICD-10-CM

## 2020-05-12 DIAGNOSIS — G8929 Other chronic pain: Secondary | ICD-10-CM

## 2020-05-12 DIAGNOSIS — Z1159 Encounter for screening for other viral diseases: Secondary | ICD-10-CM | POA: Diagnosis not present

## 2020-05-12 DIAGNOSIS — Z23 Encounter for immunization: Secondary | ICD-10-CM

## 2020-05-12 MED ORDER — AMLODIPINE BESYLATE 10 MG PO TABS: 10.0000 mg | ORAL_TABLET | Freq: Every day | ORAL | 1 refills | Status: DC

## 2020-05-12 NOTE — Progress Notes (Signed)
Subjective:    Patient ID: Nicole Douglas, female    DOB: 1945-11-03, 74 y.o.   MRN: [id number]  HPI  Patient is a 74 yr old female who presents today for follow up.    Hot flashes- uses gabapentin HS prn. Overall stable.   HTN- maintained on amlodipine 5mg  once daily as well as metoprolol 25mg  bid and losartan 50mg  once daily.   BP Readings from Last 3 Encounters:  05/12/20 (!) 164/72  11/07/19 (!) 146/72  03/20/19 137/64   Diabetes type 2- diet controlled.  Lab Results  Component Value Date   HGBA1C 6.9 (H) 11/07/2019   HGBA1C 6.1 02/15/2018   HGBA1C 6.0 08/19/2017   Lab Results  Component Value Date   LDLCALC 88 11/07/2019   CREATININE 1.04 11/07/2019   Back pain- continues meloxicam 7.5mg  once daily. Back pain has been stable overall.   Hypothyroid- maintained on synthroid 50 mcg.  Wt Readings from Last 3 Encounters:  05/12/20 183 lb (83 kg)  11/07/19 184 lb (83.5 kg)  03/20/19 184 lb 6.4 oz (83.6 kg)    Lab Results  Component Value Date   TSH 1.44 11/07/2019   GERD- maintained on prilosec 20mg .    Review of Systems See HPI  Past Medical History:  Diagnosis Date  . Hyperglycemia   . Hyperlipemia   . Hypertension   . Thyroid disease      Social History   Socioeconomic History  . Marital status: Married    Spouse name: Not on file  . Number of children: Not on file  . Years of education: Not on file  . Highest education level: Not on file  Occupational History  . Not on file  Tobacco Use  . Smoking status: Never Smoker  . Smokeless tobacco: Never Used  Substance and Sexual Activity  . Alcohol use: No  . Drug use: No  . Sexual activity: Not on file  Other Topics Concern  . Not on file  Social History Narrative   Retired from 11/09/19   3 grown children (oldest daughter in [place], Daughter local, Son deceased was murdered)   Married   Enjoys reading, crossword, adult coloring, sewing    No pets   Social Determinants of 11/09/2019 Strain:   . Difficulty of Paying Living Expenses: Not on file  Food Insecurity:   . Worried About in the Last Year: Not on file  . Ran Out of Food in the Last Year: Not on file  Transportation Needs:   . Lack of Transportation (Medical): Not on file  . Lack of Transportation (Non-Medical): Not on file  Physical Activity:   . Days of Exercise per Week: Not on file  . Minutes of Exercise per Session: Not on file  Stress:   . Feeling of Stress : Not on file  Social Connections:   . Frequency of Communication with Friends and Family: Not on file  . Frequency of Social Gatherings with Friends and Family: Not on file  . Attends Religious Services: Not on file  . Active Member of Clubs or Organizations: Not on file  . Attends [employer] Meetings: Not on file  . Marital Status: Not on file  Intimate Partner Violence:   . Fear of Current or Ex-Partner: Not on file  . Emotionally Abused: Not on file  . Physically Abused: Not on file  . Sexually Abused: Not on file    Past Surgical  History:  Procedure Laterality Date  . ABDOMINAL HYSTERECTOMY    . BACK SURGERY    . BIOPSY BREAST     left breast  . BREAST BIOPSY Left    benign   . ROTATOR CUFF REPAIR     bilateral    Family History  Problem Relation Age of Onset  . Hypertension Other        both sides of family  . Heart disease Other   . Cancer Brother        throat  . Heart attack Mother        died at 69  . Heart attack Sister        age 27  . Prostate cancer Father     No Known Allergies  Current Outpatient Medications on File Prior to Visit  Medication Sig Dispense Refill  . aspirin 81 MG tablet Take 81 mg by mouth daily.    Marland Kitchen atorvastatin (LIPITOR) 10 MG tablet TAKE 1 TABLET BY MOUTH  DAILY 90 tablet 1  . Calcium Carbonate (CALTRATE 600 PO) Take by mouth daily.    Marland Kitchen gabapentin (NEURONTIN) 300 MG capsule Take 1 capsule (300 mg total) by mouth at bedtime. 90  capsule 1  . levothyroxine (SYNTHROID) 50 MCG tablet TAKE 1 TABLET BY MOUTH ONCE DAILY EXCEPT ONE-HALF  TABLET BY MOUTH ON SUNDAYS 81 tablet 3  . losartan (COZAAR) 50 MG tablet TAKE 2 TABLETS BY MOUTH  DAILY 180 tablet 1  . meclizine (ANTIVERT) 25 MG tablet Take 1 tablet (25 mg total) by mouth 3 (three) times daily as needed for dizziness. 30 tablet 0  . meloxicam (MOBIC) 7.5 MG tablet Take 1 tablet (7.5 mg total) by mouth daily. 90 tablet 1  . metoprolol tartrate (LOPRESSOR) 25 MG tablet TAKE 1 TABLET BY MOUTH  TWICE DAILY 180 tablet 1  . omeprazole (PRILOSEC) 20 MG capsule TAKE 1 CAPSULE BY MOUTH  DAILY 90 capsule 1   No current facility-administered medications on file prior to visit.    BP (!) 164/72 (BP Location: Right Arm, Patient Position: Sitting, Cuff Size: Small)   Pulse 99   Temp (!) 97.5 F (36.4 C) (Temporal)   Resp 16   Ht 5' 6.5" (1.689 m)   Wt 183 lb (83 kg)   SpO2 99%   BMI 29.09 kg/m       Objective:   Physical Exam Constitutional:      Appearance: She is well-developed.  Cardiovascular:     Rate and Rhythm: Normal rate and regular rhythm.     Heart sounds: Normal heart sounds. No murmur heard.   Pulmonary:     Effort: Pulmonary effort is normal. No respiratory distress.     Breath sounds: Normal breath sounds. No wheezing.  Psychiatric:        Behavior: Behavior normal.        Thought Content: Thought content normal.        Judgment: Judgment normal.           Assessment & Plan:  Hypertension-blood pressure is uncontrolled today repeat manual blood pressure check was 180/90.  I advised the patient to increase amlodipine from 5 mg to 10 mg once daily.  Continue losartan 50 mg once daily.  Low back pain-stable with meloxicam 7.5 mg p.o. daily.  Hyperlipidemia-maintained on atorvastatin 10 mg once daily.  LDL at goal. Continue current dose.  Flu shot today.  Refer for mammogram and bone density.  Hot flashes-currently stable with 300 mg  of  gabapentin at bedtime as needed.  Diabetes type 2-this is a recent diagnosis for her.  We discussed diabetic diet, exercise, and weight loss.  Will obtain follow-up A1c.  Hypothyroid-clinically stable on Synthroid at 50 mcg p.o. daily.  Will obtain follow-up TSH.  Continue Synthroid.  This visit occurred during the SARS-CoV-2 public health emergency.  Safety protocols were in place, including screening questions prior to the visit, additional usage of staff PPE, and extensive cleaning of exam room while observing appropriate contact time as indicated for disinfecting solutions.

## 2020-05-12 NOTE — Patient Instructions (Signed)
Please complete lab work prior to leaving.  Increase amlodipine from 5mg to 10mg once daily. 

## 2020-05-27 ENCOUNTER — Ambulatory Visit: Payer: Medicare Other | Admitting: Family

## 2020-05-30 ENCOUNTER — Other Ambulatory Visit: Payer: Self-pay

## 2020-05-30 ENCOUNTER — Ambulatory Visit (INDEPENDENT_AMBULATORY_CARE_PROVIDER_SITE_OTHER): Payer: Medicare Other | Admitting: Family

## 2020-05-30 VITALS — BP 138/82 | HR 65 | Temp 98.5°F | Resp 16 | Ht 66.5 in | Wt 185.0 lb

## 2020-05-30 DIAGNOSIS — I10 Essential (primary) hypertension: Secondary | ICD-10-CM | POA: Diagnosis not present

## 2020-05-30 NOTE — Progress Notes (Signed)
Subjective:    Patient ID: Nicole Douglas, female    DOB: [birth year], 74 y.o.   MRN: [id number]  HPI  Patient is a 74 yr old female who presents today for follow up.   HTN- last visit we increased amlodipine from 5mg  to 10mg .  Also on losartan 50mg .   BP Readings from Last 3 Encounters:  05/30/20 (!) 155/74  05/12/20 (!) 164/72  11/07/19 (!) 146/72      Review of Systems    see HPI  Past Medical History:  Diagnosis Date  . Hyperglycemia   . Hyperlipemia   . Hypertension   . Thyroid disease      Social History   Socioeconomic History  . Marital status: Married    Spouse name: Not on file  . Number of children: Not on file  . Years of education: Not on file  . Highest education level: Not on file  Occupational History  . Not on file  Tobacco Use  . Smoking status: Never Smoker  . Smokeless tobacco: Never Used  Substance and Sexual Activity  . Alcohol use: No  . Drug use: No  . Sexual activity: Not on file  Other Topics Concern  . Not on file  Social History Narrative   Retired from 14/10/21   3 grown children (oldest daughter in [place], Daughter local, Son deceased was murdered)   Married   Enjoys reading, crossword, adult coloring, sewing    No pets   Social Determinants of 11/09/19 Strain: Not on file  Food Insecurity: Not on file  Transportation Needs: Not on file  Physical Activity: Not on file  Stress: Not on file  Social Connections: Not on file  Intimate Partner Violence: Not on file    Past Surgical History:  Procedure Laterality Date  . ABDOMINAL HYSTERECTOMY    . BACK SURGERY    . BIOPSY BREAST     left breast  . BREAST BIOPSY Left    benign   . ROTATOR CUFF REPAIR     bilateral    Family History  Problem Relation Age of Onset  . Hypertension Other        both sides of family  . Heart disease Other   . Cancer Brother        throat  . Heart attack Mother        died at 60  . Heart attack Sister         age 76  . Prostate cancer Father     No Known Allergies  Current Outpatient Medications on File Prior to Visit  Medication Sig Dispense Refill  . amLODipine (NORVASC) 10 MG tablet Take 1 tablet (10 mg total) by mouth daily. 90 tablet 1  . aspirin 81 MG tablet Take 81 mg by mouth daily.    Corporate investment banker atorvastatin (LIPITOR) 10 MG tablet TAKE 1 TABLET BY MOUTH  DAILY 90 tablet 1  . Calcium Carbonate (CALTRATE 600 PO) Take by mouth daily.    61 gabapentin (NEURONTIN) 300 MG capsule Take 1 capsule (300 mg total) by mouth at bedtime. 90 capsule 1  . levothyroxine (SYNTHROID) 50 MCG tablet TAKE 1 TABLET BY MOUTH ONCE DAILY EXCEPT ONE-HALF  TABLET BY MOUTH ON SUNDAYS 81 tablet 3  . losartan (COZAAR) 50 MG tablet TAKE 2 TABLETS BY MOUTH  DAILY 180 tablet 1  . meclizine (ANTIVERT) 25 MG tablet Take 1 tablet (25 mg total) by mouth 3 (three) times daily  as needed for dizziness. 30 tablet 0  . meloxicam (MOBIC) 7.5 MG tablet Take 1 tablet (7.5 mg total) by mouth daily. 90 tablet 1  . metoprolol tartrate (LOPRESSOR) 25 MG tablet TAKE 1 TABLET BY MOUTH  TWICE DAILY 180 tablet 1  . omeprazole (PRILOSEC) 20 MG capsule TAKE 1 CAPSULE BY MOUTH  DAILY 90 capsule 1   No current facility-administered medications on file prior to visit.    BP (!) 155/74 (BP Location: Right Arm, Patient Position: Sitting, Cuff Size: Small)   Pulse 65   Temp 98.5 F (36.9 C) (Oral)   Resp 16   Ht 5' 6.5" (1.689 m)   Wt 185 lb (83.9 kg)   SpO2 99%   BMI 29.41 kg/m    Objective:   Physical Exam Constitutional:      Appearance: She is well-developed and well-nourished.  Neck:     Thyroid: No thyromegaly.  Cardiovascular:     Rate and Rhythm: Normal rate and regular rhythm.     Heart sounds: Normal heart sounds. No murmur heard.   Pulmonary:     Effort: Pulmonary effort is normal. No respiratory distress.     Breath sounds: Normal breath sounds. No wheezing.  Musculoskeletal:     Cervical back: Neck supple.     Right  lower leg: 2+ Edema present.     Left lower leg: 2+ Edema present.  Skin:    General: Skin is warm and dry.  Neurological:     Mental Status: She is alert and oriented to person, place, and time.  Psychiatric:        Mood and Affect: Mood and affect normal.        Behavior: Behavior normal.        Thought Content: Thought content normal.        Judgment: Judgment normal.           Assessment & Plan:  HTN- initial bp was elevated.  Repeat Manual BP check was 138/82. This is similar to what she is seeing at home. Will continue amlodipine 10mg  once daily.    This visit occurred during the SARS-CoV-2 public health emergency.  Safety protocols were in place, including screening questions prior to the visit, additional usage of staff PPE, and extensive cleaning of exam room while observing appropriate contact time as indicated for disinfecting solutions.

## 2020-05-31 ENCOUNTER — Encounter: Payer: Self-pay | Admitting: Family

## 2020-07-15 ENCOUNTER — Other Ambulatory Visit: Payer: Self-pay | Admitting: Family

## 2020-07-15 DIAGNOSIS — Z1231 Encounter for screening mammogram for malignant neoplasm of breast: Secondary | ICD-10-CM

## 2020-07-31 ENCOUNTER — Other Ambulatory Visit: Payer: Self-pay

## 2020-07-31 MED ORDER — AMLODIPINE BESYLATE 10 MG PO TABS: 10.0000 mg | ORAL_TABLET | Freq: Every day | ORAL | 1 refills | Status: DC

## 2020-07-31 MED ORDER — ATORVASTATIN CALCIUM 10 MG PO TABS: 10.0000 mg | ORAL_TABLET | Freq: Every day | ORAL | 1 refills | Status: DC

## 2020-07-31 MED ORDER — LEVOTHYROXINE SODIUM 50 MCG PO TABS: ORAL_TABLET | ORAL | 3 refills | Status: DC

## 2020-07-31 MED ORDER — MELOXICAM 7.5 MG PO TABS: 7.5000 mg | ORAL_TABLET | Freq: Every day | ORAL | 1 refills | Status: DC

## 2020-07-31 MED ORDER — OMEPRAZOLE 20 MG PO CPDR: 20.0000 mg | DELAYED_RELEASE_CAPSULE | Freq: Every day | ORAL | 1 refills | Status: DC

## 2020-07-31 MED ORDER — METOPROLOL TARTRATE 25 MG PO TABS: 25.0000 mg | ORAL_TABLET | Freq: Two times a day (BID) | ORAL | 1 refills | Status: DC

## 2020-07-31 MED ORDER — LOSARTAN POTASSIUM 50 MG PO TABS: 100.0000 mg | ORAL_TABLET | Freq: Every day | ORAL | 1 refills | Status: DC

## 2020-07-31 MED ORDER — GABAPENTIN 300 MG PO CAPS: 300.0000 mg | ORAL_CAPSULE | Freq: Every day | ORAL | 1 refills | Status: DC

## 2020-08-01 ENCOUNTER — Other Ambulatory Visit: Payer: Self-pay | Admitting: Family

## 2020-08-29 ENCOUNTER — Ambulatory Visit: Payer: Medicare Other | Admitting: Family

## 2020-09-18 ENCOUNTER — Encounter: Payer: Self-pay | Admitting: Family

## 2020-09-23 ENCOUNTER — Encounter: Payer: Self-pay | Admitting: Family

## 2020-09-23 ENCOUNTER — Other Ambulatory Visit (HOSPITAL_BASED_OUTPATIENT_CLINIC_OR_DEPARTMENT_OTHER): Payer: Self-pay

## 2020-09-23 ENCOUNTER — Ambulatory Visit (INDEPENDENT_AMBULATORY_CARE_PROVIDER_SITE_OTHER): Payer: Medicare Other | Admitting: Family

## 2020-09-23 ENCOUNTER — Other Ambulatory Visit: Payer: Self-pay

## 2020-09-23 ENCOUNTER — Telehealth: Payer: Self-pay | Admitting: Family

## 2020-09-23 VITALS — BP 139/76 | HR 74 | Temp 98.4°F | Resp 16 | Ht 66.0 in | Wt 177.8 lb

## 2020-09-23 DIAGNOSIS — E038 Other specified hypothyroidism: Secondary | ICD-10-CM

## 2020-09-23 DIAGNOSIS — I10 Essential (primary) hypertension: Secondary | ICD-10-CM | POA: Diagnosis not present

## 2020-09-23 DIAGNOSIS — R42 Dizziness and giddiness: Secondary | ICD-10-CM

## 2020-09-23 DIAGNOSIS — E785 Hyperlipidemia, unspecified: Secondary | ICD-10-CM | POA: Diagnosis not present

## 2020-09-23 DIAGNOSIS — K219 Gastro-esophageal reflux disease without esophagitis: Secondary | ICD-10-CM | POA: Diagnosis not present

## 2020-09-23 DIAGNOSIS — Z1159 Encounter for screening for other viral diseases: Secondary | ICD-10-CM | POA: Diagnosis not present

## 2020-09-23 MED ORDER — SHINGRIX 50 MCG/0.5ML IM SUSR
INTRAMUSCULAR | 1 refills | Status: DC
  Filled 2020-09-23: qty 0.5, 1d supply, fill #0

## 2020-09-23 MED ORDER — FAMOTIDINE 20 MG PO TABS: 20.0000 mg | ORAL_TABLET | Freq: Two times a day (BID) | ORAL | 1 refills | Status: DC

## 2020-09-23 MED ORDER — TETANUS-DIPHTHERIA TOXOIDS TD 2-2 LF/0.5ML IM SUSP
0.5000 mL | Freq: Once | INTRAMUSCULAR | 0 refills | Status: DC
  Filled 2020-09-23: qty 0.5, 1d supply, fill #0

## 2020-09-23 MED ORDER — FAMOTIDINE 20 MG PO TABS
20.0000 mg | ORAL_TABLET | Freq: Two times a day (BID) | ORAL | 1 refills | Status: DC
  Filled 2020-09-23: qty 180, 90d supply, fill #0

## 2020-09-23 MED ORDER — TETANUS-DIPHTH-ACELL PERTUSSIS 5-2.5-18.5 LF-MCG/0.5 IM SUSY
PREFILLED_SYRINGE | INTRAMUSCULAR | 0 refills | Status: DC
  Filled 2020-09-23: qty 0.5, 1d supply, fill #0

## 2020-09-23 NOTE — Telephone Encounter (Signed)
Records release faxed to [facility] ophtalmology

## 2020-09-23 NOTE — Telephone Encounter (Signed)
Please call Dr. Michel Harrow office at [facility] opthalmology to request copy of last DM eye.

## 2020-09-23 NOTE — Progress Notes (Signed)
Subjective:    Patient ID: MALEIYA PERGOLA, female    DOB: 1946/03/27, 75 y.o.   MRN: [id number]  HPI  Patient is a 75 yr old female who presents today for follow up.  She returns to [place] after being followed by another provider.  She has had insurance change and wishes to return to our office for ongoing care.  HTN- maintained on amlodipine 10mg , losartan 50mg , metoprolol 25mg .  Reports good compliance  BP Readings from Last 3 Encounters:  09/23/20 139/76  05/31/20 138/82  05/12/20 (!) 164/72   Hyperlipidemia- maintained on atorvastatin 10mg .  Lab Results  Component Value Date   CHOL 161 11/07/2019   HDL 48.80 11/07/2019   LDLCALC 88 11/07/2019   TRIG 119.0 11/07/2019   CHOLHDL 3 11/07/2019   Hypothyroid-  Lab Results  Component Value Date   TSH 1.44 11/07/2019   Reports that she continues meclizine as needed. Does not take every day- only  DM2- last A1C was 8.3. Declined   GERD- tried pepcid in place of omeprazole.  She reports that it is helping some 10mg  once daily.   Gabapentin and meloxicam were discontinued.  She still has numbness under the toes.    Review of Systems    see HPI  Past Medical History:  Diagnosis Date  . Hyperglycemia   . Hyperlipemia   . Hypertension   . Thyroid disease      Social History   Socioeconomic History  . Marital status: Married    Spouse name: Not on file  . Number of children: Not on file  . Years of education: Not on file  . Highest education level: Not on file  Occupational History  . Not on file  Tobacco Use  . Smoking status: Never Smoker  . Smokeless tobacco: Never Used  Substance and Sexual Activity  . Alcohol use: No  . Drug use: No  . Sexual activity: Not on file  Other Topics Concern  . Not on file  Social History Narrative   Retired from 11/09/2019   3 grown children (oldest daughter in [place], Daughter local, Son deceased was murdered)   Married   Enjoys reading, crossword, adult coloring, sewing     No pets   Social Determinants of 11/09/2019 Strain: Not on file  Food Insecurity: Not on file  Transportation Needs: Not on file  Physical Activity: Not on file  Stress: Not on file  Social Connections: Not on file  Intimate Partner Violence: Not on file    Past Surgical History:  Procedure Laterality Date  . ABDOMINAL HYSTERECTOMY    . BACK SURGERY    . BIOPSY BREAST     left breast  . BREAST BIOPSY Left    benign   . ROTATOR CUFF REPAIR     bilateral    Family History  Problem Relation Age of Onset  . Hypertension Other        both sides of family  . Heart disease Other   . Cancer Brother        throat  . Heart attack Mother        died at 61  . Heart attack Sister        age 16  . Prostate cancer Father     No Known Allergies  Current Outpatient Medications on File Prior to Visit  Medication Sig Dispense Refill  . amLODipine (NORVASC) 10 MG tablet Take 1 tablet (10 mg total) by  mouth daily. 90 tablet 1  . aspirin 81 MG tablet Take 81 mg by mouth daily.    Marland Kitchen atorvastatin (LIPITOR) 10 MG tablet TAKE 1 TABLET BY MOUTH  DAILY 90 tablet 1  . Calcium Carbonate (CALTRATE 600 PO) Take by mouth daily.    Marland Kitchen gabapentin (NEURONTIN) 300 MG capsule Take 1 capsule (300 mg total) by mouth at bedtime. 90 capsule 1  . levothyroxine (SYNTHROID) 50 MCG tablet TAKE 1 TABLET BY MOUTH ONCE DAILY EXCEPT ONE-HALF  TABLET BY MOUTH ON SUNDAYS 81 tablet 3  . losartan (COZAAR) 50 MG tablet TAKE 2 TABLETS BY MOUTH  DAILY 180 tablet 1  . meclizine (ANTIVERT) 25 MG tablet Take 1 tablet (25 mg total) by mouth 3 (three) times daily as needed for dizziness. 30 tablet 0  . meloxicam (MOBIC) 7.5 MG tablet Take 1 tablet (7.5 mg total) by mouth daily. 90 tablet 1  . metoprolol tartrate (LOPRESSOR) 25 MG tablet TAKE 1 TABLET BY MOUTH  TWICE DAILY 180 tablet 1  . omeprazole (PRILOSEC) 20 MG capsule Take 20 mg by mouth daily.     No current facility-administered medications on file  prior to visit.    BP 139/76 (BP Location: Right Arm, Patient Position: Sitting, Cuff Size: Small)   Pulse 74   Temp 98.4 F (36.9 C) (Oral)   Resp 16   Ht 5\' 6"  (1.676 m)   Wt 177 lb 12.8 oz (80.6 kg)   SpO2 97%   BMI 28.70 kg/m    Objective:   Physical Exam Constitutional:      Appearance: She is well-developed.  Neck:     Thyroid: No thyromegaly.  Cardiovascular:     Rate and Rhythm: Normal rate and regular rhythm.     Heart sounds: Normal heart sounds. No murmur heard.   Pulmonary:     Effort: Pulmonary effort is normal. No respiratory distress.     Breath sounds: Normal breath sounds. No wheezing.  Musculoskeletal:     Cervical back: Neck supple.  Skin:    General: Skin is warm and dry.  Neurological:     Mental Status: She is alert and oriented to person, place, and time.  Psychiatric:        Behavior: Behavior normal.        Thought Content: Thought content normal.        Judgment: Judgment normal.           Assessment & Plan:  Hypertension-blood pressure is stable.  Continue amlodipine 10 mg, losartan 50 mg, and metoprolol 25 mg.  Hyperlipidemia-she is maintained on a atorvastatin 10 mg p.o. daily.  Reviewed outside records.  Her last LDL performed 1 month ago was 106.  Total cholesterol 176.  We will continue current dose of based atorvastatin for now as well as diet, exercise, and weight loss.  Diabetes type 2-last A1c was performed at [facility] 1 month ago.  A1c was 8.4.  She states that her previous provider recommended that she add a medication for diabetes, however she declined and opted to work first on lifestyle modification.  We discussed that this should be rechecked in a few more months and that if she is still above goal that we consider adding an oral treatment.  Patient verbalizes understanding.  Hypothyroid-clinically stable.  Last TSH performed at [facility] on 08/08/2020 was 1.9.  Continue current dose of Synthroid 50 mcg p.o.  daily.  GERD-she reports previous provider discontinued her omeprazole.  She has been  using Pepcid 10 mg p.o. OTC as needed with some breakthrough heartburn symptoms.  Advised patient that we will change the to famotidine 20 mg p.o. twice daily and a prescription has been sent to her pharmacy.  Vertigo-this is been a longstanding issue for her.  She uses meclizine 25 mg p.o. 3 times daily as needed with good relief.  She states that she had previously done vestibular rehab without improvement in her symptoms.  I reviewed her chart and it looks like the referral was placed back to 2015.  This visit occurred during the SARS-CoV-2 public health emergency.  Safety protocols were in place, including screening questions prior to the visit, additional usage of staff PPE, and extensive cleaning of exam room while observing appropriate contact time as indicated for disinfecting solutions.

## 2020-12-11 ENCOUNTER — Ambulatory Visit
Admission: RE | Admit: 2020-12-11 | Discharge: 2020-12-11 | Disposition: A | Discharge status: Home / Self Care | Payer: Medicare Other | Source: Ambulatory Visit | Attending: Family | Admitting: Family

## 2020-12-11 ENCOUNTER — Other Ambulatory Visit: Payer: Self-pay

## 2020-12-11 DIAGNOSIS — E348 Other specified endocrine disorders: Secondary | ICD-10-CM

## 2020-12-11 DIAGNOSIS — Z0000 Encounter for general adult medical examination without abnormal findings: Secondary | ICD-10-CM

## 2020-12-11 DIAGNOSIS — Z780 Asymptomatic menopausal state: Secondary | ICD-10-CM | POA: Diagnosis not present

## 2020-12-11 DIAGNOSIS — Z1231 Encounter for screening mammogram for malignant neoplasm of breast: Secondary | ICD-10-CM

## 2020-12-23 ENCOUNTER — Other Ambulatory Visit: Payer: Self-pay

## 2020-12-23 ENCOUNTER — Telehealth: Payer: Self-pay | Admitting: Family

## 2020-12-23 ENCOUNTER — Ambulatory Visit: Payer: Medicare Other | Attending: Internal Medicine

## 2020-12-23 ENCOUNTER — Ambulatory Visit (INDEPENDENT_AMBULATORY_CARE_PROVIDER_SITE_OTHER): Payer: Medicare Other | Admitting: Family

## 2020-12-23 VITALS — BP 136/63 | HR 54 | Temp 98.8°F | Resp 16 | Ht 66.0 in | Wt 176.0 lb

## 2020-12-23 DIAGNOSIS — E785 Hyperlipidemia, unspecified: Secondary | ICD-10-CM

## 2020-12-23 DIAGNOSIS — R011 Cardiac murmur, unspecified: Secondary | ICD-10-CM | POA: Diagnosis not present

## 2020-12-23 DIAGNOSIS — E039 Hypothyroidism, unspecified: Secondary | ICD-10-CM

## 2020-12-23 DIAGNOSIS — K625 Hemorrhage of anus and rectum: Secondary | ICD-10-CM

## 2020-12-23 DIAGNOSIS — Z23 Encounter for immunization: Secondary | ICD-10-CM

## 2020-12-23 DIAGNOSIS — E119 Type 2 diabetes mellitus without complications: Secondary | ICD-10-CM | POA: Diagnosis not present

## 2020-12-23 DIAGNOSIS — I10 Essential (primary) hypertension: Secondary | ICD-10-CM | POA: Diagnosis not present

## 2020-12-23 LAB — CBC WITH DIFFERENTIAL/PLATELET
Basophils Absolute: 0 10*3/uL (ref 0.0–0.1)
Basophils Relative: 0.6 % (ref 0.0–3.0)
Eosinophils Absolute: 0.1 10*3/uL (ref 0.0–0.7)
Eosinophils Relative: 2.1 % (ref 0.0–5.0)
HCT: 36.3 % (ref 36.0–46.0)
Hemoglobin: 12.5 g/dL (ref 12.0–15.0)
Lymphocytes Relative: 26.2 % (ref 12.0–46.0)
Lymphs Abs: 1.6 10*3/uL (ref 0.7–4.0)
MCHC: 34.5 g/dL (ref 30.0–36.0)
MCV: 84.8 fl (ref 78.0–100.0)
Monocytes Absolute: 0.7 10*3/uL (ref 0.1–1.0)
Monocytes Relative: 11.3 % (ref 3.0–12.0)
Neutro Abs: 3.5 10*3/uL (ref 1.4–7.7)
Neutrophils Relative %: 59.8 % (ref 43.0–77.0)
Platelets: 205 10*3/uL (ref 150.0–400.0)
RBC: 4.28 Mil/uL (ref 3.87–5.11)
RDW: 14.9 % (ref 11.5–15.5)
WBC: 5.9 10*3/uL (ref 4.0–10.5)

## 2020-12-23 LAB — LIPID PANEL
Cholesterol: 171 mg/dL (ref 0–200)
HDL: 51.9 mg/dL (ref >39.00)
LDL Cholesterol: 102 mg/dL — ABNORMAL HIGH (ref 0–99)
NonHDL: 118.81
Total CHOL/HDL Ratio: 3
Triglycerides: 84 mg/dL (ref 0.0–149.0)
VLDL: 16.8 mg/dL (ref 0.0–40.0)

## 2020-12-23 LAB — COMPREHENSIVE METABOLIC PANEL
ALT: 25 U/L (ref 0–35)
AST: 16 U/L (ref 0–37)
Albumin: 4.2 g/dL (ref 3.5–5.2)
Alkaline Phosphatase: 122 U/L — ABNORMAL HIGH (ref 39–117)
BUN: 13 mg/dL (ref 6–23)
CO2: 32 mEq/L (ref 19–32)
Calcium: 9.5 mg/dL (ref 8.4–10.5)
Chloride: 104 mEq/L (ref 96–112)
Creatinine, Ser: 0.97 mg/dL (ref 0.40–1.20)
GFR: 56.79 mL/min — ABNORMAL LOW (ref >60.00)
Glucose, Bld: 227 mg/dL — ABNORMAL HIGH (ref 70–99)
Potassium: 4.8 mEq/L (ref 3.5–5.1)
Sodium: 141 mEq/L (ref 135–145)
Total Bilirubin: 0.8 mg/dL (ref 0.2–1.2)
Total Protein: 7 g/dL (ref 6.0–8.3)

## 2020-12-23 LAB — TSH: TSH: 1.7 u[IU]/mL (ref 0.35–5.50)

## 2020-12-23 LAB — HEMOGLOBIN A1C: Hgb A1c MFr Bld: 8.8 % — ABNORMAL HIGH (ref 4.6–6.5)

## 2020-12-23 NOTE — Assessment & Plan Note (Signed)
Recent normal colo is reassuring. Suspect that it is related to her internal hemorrhoid bleeding. Check cbc to assess for anemia. Pt is advised to call if increased bleeding/frequency occurs.

## 2020-12-23 NOTE — Assessment & Plan Note (Signed)
BP is stable. Continue losartan 50mg , metoprolol 25mg , amlodipine 10mg .

## 2020-12-23 NOTE — Progress Notes (Signed)
   Covid-19 Vaccination Clinic  Name:  EVIANA SIBILIA    MRN: [id number] DOB: 02-17-1946  12/23/2020  Ms. Loiselle was observed post Covid-19 immunization for 15 minutes without incident. She was provided with Vaccine Information Sheet and instruction to access the V-Safe system.   Ms. Baray was instructed to call 911 with any severe reactions post vaccine: Difficulty breathing  Swelling of face and throat  A fast heartbeat  A bad rash all over body  Dizziness and weakness   Immunizations Administered     Name Date Dose VIS Date Route   PFIZER Comrnaty(Gray TOP) Covid-19 Vaccine 12/23/2020  9:35 AM 0.3 mL 05/29/2020 Intramuscular   Manufacturer: [employer], [employer]   Lot: [id number]   NDC: [phone]

## 2020-12-23 NOTE — Assessment & Plan Note (Signed)
Clinically stable on synthroid 50 mcg once daily. Continue same.

## 2020-12-23 NOTE — Telephone Encounter (Signed)
See mychart.  

## 2020-12-23 NOTE — Telephone Encounter (Signed)
Records updated.

## 2020-12-23 NOTE — Assessment & Plan Note (Signed)
Due for follow up lipid panel. Check lipids, continue atorvastatin 10mg .

## 2020-12-23 NOTE — Patient Instructions (Signed)
Please complete lab work prior to leaving.   

## 2020-12-23 NOTE — Telephone Encounter (Signed)
Can you please call pharmacy downstairs and request immunization information.

## 2020-12-23 NOTE — Progress Notes (Signed)
Subjective:     Patient ID: Nicole Douglas, female    DOB: [birth year], 75 y.o.   MRN: [id number]  Chief Complaint  Patient presents with   Hypertension    Here for follow up   Hyperlipidemia    Here for follow up   Rectal Bleeding    Patient reports having blood in stools twice last week. Last colonoscopy about 2 months ago.   Hypothyroidism    HPI Patient is in today for follow up.  HTN-  BP Readings from Last 3 Encounters:  12/23/20 136/63  09/23/20 139/76  05/31/20 138/82   Maintained on losartan 9m, metoprolol 235m amlodipine 1085m  Hyperlipidemia-  Lab Results  Component Value Date   CHOL 161 11/07/2019   HDL 48.80 11/07/2019   LDLCALC 88 11/07/2019   TRIG 119.0 11/07/2019   CHOLHDL 3 11/07/2019   Maintained on atorvastatin 23m31m Hypothyroid- maintained on synthroid 50mc35mLab Results  Component Value Date   TSH 1.44 11/07/2019   Rectal bleeding- had a grade 1 internal hemorrhoid noted on colon 3/22.    Lab Results  Component Value Date   HGBA1C 6.9 (H) 11/07/2019   HGBA1C 6.1 02/15/2018   HGBA1C 6.0 08/19/2017   Lab Results  Component Value Date   LDLCALC 88 11/07/2019   CREATININE 1.04 11/07/2019    Health Maintenance Due  Topic Date Due   FOOT EXAM  Never done   OPHTHALMOLOGY EXAM  Never done   Hepatitis C Screening  Never done   Zoster Vaccines- Shingrix (1 of 2) Never done   TETANUS/TDAP  03/27/2018   HEMOGLOBIN A1C  05/09/2020    Past Medical History:  Diagnosis Date   Hyperglycemia    Hyperlipemia    Hypertension    Thyroid disease     Past Surgical History:  Procedure Laterality Date   ABDOMINAL HYSTERECTOMY     BACK SURGERY     BIOPSY BREAST     left breast   BREAST BIOPSY Left    benign    ROTATOR CUFF REPAIR     bilateral    Family History  Problem Relation Age of Onset   Hypertension Other        both sides of family   Heart disease Other    Cancer Brother        throat   Heart attack Mother         died at 72   6art attack Sister        age 67   30ostate cancer Father     Social History   Socioeconomic History   Marital status: Married    Spouse name: Not on file   Number of children: Not on file   Years of education: Not on file   Highest education level: Not on file  Occupational History   Not on file  Tobacco Use   Smoking status: Never   Smokeless tobacco: Never  Substance and Sexual Activity   Alcohol use: No   Drug use: No   Sexual activity: Not on file  Other Topics Concern   Not on file  Social History Narrative   Retired from [employer] children ([place] in [place], Son deceased was murdered)   Married   Enjoys reading, crossword, adult coloring, sewing    No pets   Social Determinants of [employer] broadcast assistantin: Not on file  Food Insecurity: Not on  file  Transportation Needs: Not on file  Physical Activity: Not on file  Stress: Not on file  Social Connections: Not on file  Intimate Partner Violence: Not on file    Outpatient Medications Prior to Visit  Medication Sig Dispense Refill   amLODipine (NORVASC) 10 MG tablet Take 1 tablet (10 mg total) by mouth daily. 90 tablet 1   aspirin 81 MG tablet Take 81 mg by mouth daily.     atorvastatin (LIPITOR) 10 MG tablet TAKE 1 TABLET BY MOUTH  DAILY 90 tablet 1   Calcium Carbonate (CALTRATE 600 PO) Take by mouth daily.     famotidine (PEPCID) 20 MG tablet Take 1 tablet (20 mg total) by mouth 2 (two) times daily. 180 tablet 1   levothyroxine (SYNTHROID) 50 MCG tablet TAKE 1 TABLET BY MOUTH ONCE DAILY EXCEPT ONE-HALF  TABLET BY MOUTH ON SUNDAYS 81 tablet 3   losartan (COZAAR) 50 MG tablet TAKE 2 TABLETS BY MOUTH  DAILY 180 tablet 1   meclizine (ANTIVERT) 25 MG tablet Take 1 tablet (25 mg total) by mouth 3 (three) times daily as needed for dizziness. 30 tablet 0   metoprolol tartrate (LOPRESSOR) 25 MG tablet TAKE 1 TABLET BY MOUTH  TWICE DAILY 180 tablet 1   Tdap  (BOOSTRIX) 5-2.5-18.5 LF-MCG/0.5 injection Inject by pharmacist as directed 0.5 mL 0   Zoster Vaccine Adjuvanted [facility]) injection Inject 0.36m IM now and again in 2-6 months. 0.5 mL 1   No facility-administered medications prior to visit.    No Known Allergies  ROS See HPI    Objective:    Physical Exam Constitutional:      Appearance: She is well-developed.  Cardiovascular:     Rate and Rhythm: Normal rate and regular rhythm.     Heart sounds: Murmur heard.  Systolic murmur is present with a grade of 2/6.  Pulmonary:     Effort: Pulmonary effort is normal. No respiratory distress.     Breath sounds: Normal breath sounds. No wheezing.  Psychiatric:        Behavior: Behavior normal.        Thought Content: Thought content normal.        Judgment: Judgment normal.    BP 136/63 (BP Location: Right Arm, Patient Position: Sitting, Cuff Size: Small)   Pulse (!) 54   Temp 98.8 F (37.1 C) (Oral)   Resp 16   Ht _0  (1.676 m)   Wt 176 lb (79.8 kg)   SpO2 99%   BMI 28.41 kg/m  Wt Readings from Last 3 Encounters:  12/23/20 176 lb (79.8 kg)  09/23/20 177 lb 12.8 oz (80.6 kg)  05/30/20 185 lb (83.9 kg)       Assessment & Plan:   Problem List Items Addressed This Visit       Unprioritized   Rectal bleeding - Primary    Recent normal colo is reassuring. Suspect that it is related to her internal hemorrhoid bleeding. Check cbc to assess for anemia. Pt is advised to call if increased bleeding/frequency occurs.        Relevant Orders   CBC with Differential/Platelet   Hypothyroidism    Clinically stable on synthroid 50 mcg once daily. Continue same.        Relevant Orders   TSH   Hyperlipidemia    Due for follow up lipid panel. Check lipids, continue atorvastatin 164m        Relevant Orders   Lipid panel   Comp Met (  CMET)   HTN (hypertension)    BP is stable. Continue losartan 42m, metoprolol 262m amlodipine 1016m       Heart murmur    New. Will  refer for 2D echo for further evaluation.        Relevant Orders   ECHOCARDIOGRAM COMPLETE   Controlled type 2 diabetes mellitus without complication, without long-term current use of insulin (HCC)   Relevant Orders   Hemoglobin A1c    I am having MarJack Quartointain her aspirin, Calcium Carbonate (CALTRATE 600 PO), meclizine, amLODipine, levothyroxine, atorvastatin, metoprolol tartrate, losartan, Shingrix, famotidine, and Tdap.  No orders of the defined types were placed in this encounter.

## 2020-12-23 NOTE — Assessment & Plan Note (Signed)
New. Will refer for 2D echo for further evaluation.  

## 2020-12-25 ENCOUNTER — Telehealth: Payer: Self-pay | Admitting: Family

## 2020-12-25 MED ORDER — METFORMIN HCL 500 MG PO TABS
500.0000 mg | ORAL_TABLET | Freq: Two times a day (BID) | ORAL | 1 refills | Status: DC
  Filled 2020-12-25: qty 180, 90d supply, fill #0

## 2020-12-25 NOTE — Telephone Encounter (Signed)
Sugar is uncontrolled. Please add metformin 500mg  bid and work on diabetic diet.

## 2020-12-26 ENCOUNTER — Other Ambulatory Visit (HOSPITAL_BASED_OUTPATIENT_CLINIC_OR_DEPARTMENT_OTHER): Payer: Self-pay

## 2020-12-26 MED ORDER — COVID-19 MRNA VAC-TRIS(PFIZER) 30 MCG/0.3ML IM SUSP
INTRAMUSCULAR | 0 refills | Status: DC
  Filled 2020-12-26: qty 0.3, 1d supply, fill #0

## 2020-12-29 NOTE — Telephone Encounter (Signed)
Left message on machine to call back  

## 2020-12-30 MED ORDER — METFORMIN HCL 500 MG PO TABS: 500.0000 mg | ORAL_TABLET | Freq: Two times a day (BID) | ORAL | 1 refills | Status: DC

## 2020-12-30 NOTE — Telephone Encounter (Signed)
Left message on machine to call back  

## 2021-01-05 ENCOUNTER — Other Ambulatory Visit (HOSPITAL_COMMUNITY): Payer: Self-pay

## 2021-01-05 ENCOUNTER — Other Ambulatory Visit (HOSPITAL_BASED_OUTPATIENT_CLINIC_OR_DEPARTMENT_OTHER): Payer: Self-pay

## 2021-01-07 ENCOUNTER — Other Ambulatory Visit: Payer: Self-pay | Admitting: Family

## 2021-01-08 ENCOUNTER — Ambulatory Visit (HOSPITAL_BASED_OUTPATIENT_CLINIC_OR_DEPARTMENT_OTHER): Payer: Medicare Other

## 2021-01-19 ENCOUNTER — Other Ambulatory Visit: Payer: Self-pay | Admitting: Family

## 2021-02-27 ENCOUNTER — Encounter (HOSPITAL_BASED_OUTPATIENT_CLINIC_OR_DEPARTMENT_OTHER): Payer: Self-pay | Admitting: *Deleted

## 2021-02-27 ENCOUNTER — Other Ambulatory Visit: Payer: Self-pay

## 2021-02-27 ENCOUNTER — Emergency Department (HOSPITAL_BASED_OUTPATIENT_CLINIC_OR_DEPARTMENT_OTHER)
Admission: EM | Admit: 2021-02-27 | Discharge: 2021-02-28 | Discharge status: Against Medical Advice | Payer: Medicare Other | Attending: Emergency Medicine | Admitting: Emergency Medicine

## 2021-02-27 ENCOUNTER — Emergency Department (HOSPITAL_BASED_OUTPATIENT_CLINIC_OR_DEPARTMENT_OTHER): Payer: Medicare Other

## 2021-02-27 DIAGNOSIS — S8992XA Unspecified injury of left lower leg, initial encounter: Secondary | ICD-10-CM | POA: Diagnosis not present

## 2021-02-27 DIAGNOSIS — S8002XA Contusion of left knee, initial encounter: Secondary | ICD-10-CM | POA: Insufficient documentation

## 2021-02-27 DIAGNOSIS — T07XXXA Unspecified multiple injuries, initial encounter: Secondary | ICD-10-CM

## 2021-02-27 DIAGNOSIS — S8991XA Unspecified injury of right lower leg, initial encounter: Secondary | ICD-10-CM | POA: Diagnosis not present

## 2021-02-27 DIAGNOSIS — M25462 Effusion, left knee: Secondary | ICD-10-CM | POA: Diagnosis not present

## 2021-02-27 DIAGNOSIS — S199XXA Unspecified injury of neck, initial encounter: Secondary | ICD-10-CM | POA: Diagnosis not present

## 2021-02-27 DIAGNOSIS — Z79899 Other long term (current) drug therapy: Secondary | ICD-10-CM | POA: Diagnosis not present

## 2021-02-27 DIAGNOSIS — M542 Cervicalgia: Secondary | ICD-10-CM | POA: Insufficient documentation

## 2021-02-27 DIAGNOSIS — S80212A Abrasion, left knee, initial encounter: Secondary | ICD-10-CM | POA: Diagnosis not present

## 2021-02-27 DIAGNOSIS — E119 Type 2 diabetes mellitus without complications: Secondary | ICD-10-CM | POA: Insufficient documentation

## 2021-02-27 DIAGNOSIS — W1840XA Slipping, tripping and stumbling without falling, unspecified, initial encounter: Secondary | ICD-10-CM | POA: Insufficient documentation

## 2021-02-27 DIAGNOSIS — W19XXXA Unspecified fall, initial encounter: Secondary | ICD-10-CM

## 2021-02-27 DIAGNOSIS — Z7984 Long term (current) use of oral hypoglycemic drugs: Secondary | ICD-10-CM | POA: Insufficient documentation

## 2021-02-27 DIAGNOSIS — M25521 Pain in right elbow: Secondary | ICD-10-CM | POA: Diagnosis not present

## 2021-02-27 DIAGNOSIS — S80211A Abrasion, right knee, initial encounter: Secondary | ICD-10-CM | POA: Diagnosis not present

## 2021-02-27 DIAGNOSIS — S8001XA Contusion of right knee, initial encounter: Secondary | ICD-10-CM | POA: Insufficient documentation

## 2021-02-27 DIAGNOSIS — I10 Essential (primary) hypertension: Secondary | ICD-10-CM | POA: Insufficient documentation

## 2021-02-27 DIAGNOSIS — M25551 Pain in right hip: Secondary | ICD-10-CM | POA: Diagnosis not present

## 2021-02-27 DIAGNOSIS — S0990XA Unspecified injury of head, initial encounter: Secondary | ICD-10-CM | POA: Diagnosis not present

## 2021-02-27 DIAGNOSIS — R519 Headache, unspecified: Secondary | ICD-10-CM | POA: Diagnosis not present

## 2021-02-27 DIAGNOSIS — M7989 Other specified soft tissue disorders: Secondary | ICD-10-CM | POA: Diagnosis not present

## 2021-02-27 MED ORDER — HYDROCODONE-ACETAMINOPHEN 5-325 MG PO TABS
1.0000 | ORAL_TABLET | Freq: Once | ORAL | Status: AC
  Administered 2021-02-27: 1 via ORAL
  Filled 2021-02-27: qty 1

## 2021-02-27 MED ORDER — IBUPROFEN 800 MG PO TABS
800.0000 mg | ORAL_TABLET | Freq: Once | ORAL | Status: AC
  Administered 2021-02-27: 800 mg via ORAL
  Filled 2021-02-27: qty 1

## 2021-02-27 NOTE — ED Provider Notes (Signed)
[facility] EMERGENCY DEPARTMENT Provider Note   CSN: [id number] Arrival date & time: 02/27/21  2112     History Chief Complaint  Patient presents with   Nicole Douglas    Nicole Douglas is a 75 y.o. female.  Patient with a history of hypertension, hyperlipidemia presenting with mechanical fall at the clothing store.  States she tripped over uneven rug landing on her bilateral knees and right side.  She hit her head but did not lose consciousness.  Complains of pain to her head, right neck, right hip, bilateral knees.  No blood thinner use.  Denies any chest pain or shortness of breath.  Denies taking thing for pain.  Was able to ambulate after the fall.  Has bruising to bilateral knees with abrasions.  Tetanus is up-to-date.  No chest pain or shortness of breath.  No preceding dizziness or lightheadedness.  No focal weakness, numbness or tingling.  The history is provided by the patient.  Fall Associated symptoms include headaches. Pertinent negatives include no chest pain, no abdominal pain and no shortness of breath.      Past Medical History:  Diagnosis Date   Hyperglycemia    Hyperlipemia    Hypertension    Thyroid disease     Patient Active Problem List   Diagnosis Date Noted   Heart murmur 12/23/2020   Rectal bleeding 12/23/2020   Controlled type 2 diabetes mellitus without complication, without long-term current use of insulin (HCC) 05/12/2020   Hyperglycemia    Post menopausal syndrome 04/13/2016   Hypothyroidism 10/01/2014   GERD (gastroesophageal reflux disease) 03/27/2014   Pelvic pressure in female 03/27/2014   Physical exam 03/27/2014   Tinnitus 10/31/2013   Right hand pain 10/26/2013   Dizziness 10/19/2013   HTN (hypertension) 09/20/2013   Hyperlipidemia 09/20/2013   Degenerative disc disease, lumbar 09/20/2013   Lumbar strain 09/15/2013   PARESTHESIA 03/10/2007   PELVIC  PAIN 03/10/2007   PALPITATIONS, HX OF 03/10/2007   COLONOSCOPY, HX OF 03/10/2007     Past Surgical History:  Procedure Laterality Date   ABDOMINAL HYSTERECTOMY     BACK SURGERY     BIOPSY BREAST     left breast   BREAST BIOPSY Left    benign    ROTATOR CUFF REPAIR     bilateral     OB History   No obstetric history on file.     Family History  Problem Relation Age of Onset   Hypertension Other        both sides of family   Heart disease Other    Cancer Brother        throat   Heart attack Mother        died at 98   Heart attack Sister        age 60   Prostate cancer Father     Social History   Tobacco Use   Smoking status: Never   Smokeless tobacco: Never  Substance Use Topics   Alcohol use: No   Drug use: No    Home Medications Prior to Admission medications   Medication Sig Start Date End Date Taking? Authorizing Provider  amLODipine (NORVASC) 10 MG tablet TAKE 1 TABLET BY MOUTH  DAILY 01/07/21   Sandford Craze, NP  aspirin 81 MG tablet Take 81 mg by mouth daily.    [provider]  atorvastatin (LIPITOR) 10 MG tablet TAKE 1 TABLET BY MOUTH  DAILY 08/01/20   Sandford Craze, NP  Calcium Carbonate (  CALTRATE 600 PO) Take by mouth daily.    [provider]  COVID-19 mRNA Vac-TriS, Pfizer, SUSP injection Inject into the muscle. 12/23/20   Judyann Munson, MD  famotidine (PEPCID) 20 MG tablet TAKE 1 TABLET BY MOUTH  TWICE DAILY 01/20/21   Sandford Craze, NP  levothyroxine (SYNTHROID) 50 MCG tablet TAKE 1 TABLET BY MOUTH ONCE DAILY EXCEPT ONE-HALF  TABLET BY MOUTH ON SUNDAYS 01/07/21   Sandford Craze, NP  losartan (COZAAR) 50 MG tablet TAKE 2 TABLETS BY MOUTH  DAILY 08/01/20   Sandford Craze, NP  meclizine (ANTIVERT) 25 MG tablet Take 1 tablet (25 mg total) by mouth 3 (three) times daily as needed for dizziness. 02/15/18   Sandford Craze, NP  metFORMIN (GLUCOPHAGE) 500 MG tablet Take 1 tablet (500 mg total) by mouth 2 (two) times daily with a meal. 12/25/20   Sandford Craze, NP  metFORMIN (GLUCOPHAGE)  500 MG tablet Take 1 tablet (500 mg total) by mouth 2 (two) times daily with a meal. 12/30/20   Sandford Craze, NP  metoprolol tartrate (LOPRESSOR) 25 MG tablet TAKE 1 TABLET BY MOUTH  TWICE DAILY 08/01/20   Sandford Craze, NP  Tdap Leda Min) 5-2.5-18.5 LF-MCG/0.5 injection Inject by pharmacist as directed 09/23/20   Sandford Craze, NP  Zoster Vaccine Adjuvanted [facility]) injection Inject 0.5mg  IM now and again in 2-6 months. 09/23/20   Sandford Craze, NP    Allergies    Patient has no known allergies.  Review of Systems   Review of Systems  Constitutional:  Positive for activity change and appetite change. Negative for fever.  HENT:  Negative for congestion.   Respiratory:  Negative for cough, chest tightness and shortness of breath.   Cardiovascular:  Negative for chest pain.  Gastrointestinal:  Negative for abdominal pain, nausea and vomiting.  Genitourinary:  Negative for dysuria.  Musculoskeletal:  Positive for arthralgias, myalgias and neck pain.  Skin:  Positive for wound.  Neurological:  Positive for headaches. Negative for dizziness and weakness.   all other systems are negative except as noted in the HPI and PMH.   Physical Exam Updated Vital Signs BP (!) 168/88 (BP Location: Left Arm)   Pulse 85   Temp 98.4 F (36.9 C) (Oral)   Resp 18   Ht 5\' 6"  (1.676 m)   Wt 79.4 kg   SpO2 98%   BMI 28.25 kg/m   Physical Exam Vitals and nursing note reviewed.  Constitutional:      General: She is not in acute distress.    Appearance: She is well-developed.  HENT:     Head: Normocephalic and atraumatic.     Mouth/Throat:     Pharynx: No oropharyngeal exudate.  Eyes:     Conjunctiva/sclera: Conjunctivae normal.     Pupils: Pupils are equal, round, and reactive to light.  Neck:     Comments: Right paraspinal cervical tenderness, no midline C-spine tenderness Cardiovascular:     Rate and Rhythm: Normal rate and regular rhythm.     Heart sounds: Normal heart  sounds. No murmur heard. Pulmonary:     Effort: Pulmonary effort is normal. No respiratory distress.     Breath sounds: Normal breath sounds.  Chest:     Chest wall: No tenderness.  Abdominal:     Palpations: Abdomen is soft.     Tenderness: There is no abdominal tenderness. There is no guarding or rebound.  Musculoskeletal:        General: No tenderness. Normal range of motion.  Cervical back: Normal range of motion and neck supple.     Comments: No T or L spine tenderness  Abrasions and ecchymosis to bilateral knees.  Tenderness to palpation of tibial plateaus bilaterally.  Flexion and extension intact.  Able to raise legs and keep knees extended bilaterally. Pain with range of motion of right hip.   Tenderness right lateral elbow without deformity.  Flexion extension are intact.  No pain with supination and pronation.  Skin:    General: Skin is warm.  Neurological:     Mental Status: She is alert and oriented to person, place, and time.     Cranial Nerves: No cranial nerve deficit.     Motor: No abnormal muscle tone.     Coordination: Coordination normal.     Comments:  5/5 strength throughout. CN 2-12 intact.Equal grip strength.   Psychiatric:        Behavior: Behavior normal.    ED Results / Procedures / Treatments   Labs (all labs ordered are listed, but only abnormal results are displayed) Labs Reviewed - No data to display  EKG None  Radiology DG Elbow Complete Right  Result Date: 02/27/2021 CLINICAL DATA:  Fall, right elbow pain EXAM: RIGHT ELBOW - COMPLETE 3+ VIEW COMPARISON:  None. FINDINGS: There is no evidence of fracture, dislocation, or joint effusion. There is no evidence of arthropathy or other focal bone abnormality. Soft tissues are unremarkable. IMPRESSION: Negative. Electronically Signed   By: Helyn Numbers M.D.   On: 02/27/2021 22:49   CT HEAD WO CONTRAST ( )  Result Date: 02/28/2021 CLINICAL DATA:  Head trauma, mod-severe; Polytrauma,  critical, head/C-spine injury suspected. Fall with head injury EXAM: CT HEAD WITHOUT CONTRAST CT CERVICAL SPINE WITHOUT CONTRAST TECHNIQUE: Multidetector CT imaging of the head and cervical spine was performed following the standard protocol without intravenous contrast. Multiplanar CT image reconstructions of the cervical spine were also generated. COMPARISON:  None. FINDINGS: CT HEAD FINDINGS Brain: Normal anatomic configuration. No abnormal intra or extra-axial mass lesion or fluid collection. No abnormal mass effect or midline shift. No evidence of acute intracranial hemorrhage or infarct. Ventricular size is normal. Cerebellum unremarkable. Vascular: Unremarkable Skull: Intact Sinuses/Orbits: Paranasal sinuses are clear. Orbits are unremarkable. Other: Mastoid air cells and middle ear cavities are clear. CT CERVICAL SPINE FINDINGS Alignment: Minimal retrolisthesis of C5 upon C6 and anterolisthesis of C3 upon C4 is likely degenerative in nature. Mild reversal of the normal cervical lordosis. Skull base and vertebrae: The craniocervical alignment is normal. The atlantodental interval is not widened. There is no acute fracture of the cervical spine. There is ankylosis of the facet joints of C2-3 bilaterally. Soft tissues and spinal canal: Retrolisthesis at C5-6 in combination with small posterior disc osteophyte complex results in mild central canal stenosis. The spinal canal is otherwise widely patent. No canal hematoma. No prevertebral soft tissue swelling or fluid identified. No cervical adenopathy. Thyroid unremarkable. Disc levels: There is intervertebral disc space narrowing and endplate remodeling at C4-C6 in keeping with changes of advanced degenerative disc disease. Milder degenerative changes are noted at C3-4. Vertebral body height is preserved. Prevertebral soft tissues are not thickened on sagittal reformats. Review of the axial images demonstrates moderate bilateral neuroforaminal narrowing at C3-4,  severe bilateral neuroforaminal narrowing at C4-5, moderate right and severe left neuroforaminal narrowing at C5-6, secondary to combination uncovertebral and facet arthrosis. Upper chest: Unremarkable Other: None IMPRESSION: No acute intracranial abnormality.  No calvarial fracture. No acute fracture or listhesis of the cervical spine.  Multilevel degenerative disc and degenerative joint disease resulting in multilevel neuroforaminal narrowing as described above and mild central canal stenosis at C5-6. Electronically Signed   By: Helyn Numbers M.D.   On: 02/28/2021 02:04   CT Cervical Spine Wo Contrast  Result Date: 02/28/2021 CLINICAL DATA:  Head trauma, mod-severe; Polytrauma, critical, head/C-spine injury suspected. Fall with head injury EXAM: CT HEAD WITHOUT CONTRAST CT CERVICAL SPINE WITHOUT CONTRAST TECHNIQUE: Multidetector CT imaging of the head and cervical spine was performed following the standard protocol without intravenous contrast. Multiplanar CT image reconstructions of the cervical spine were also generated. COMPARISON:  None. FINDINGS: CT HEAD FINDINGS Brain: Normal anatomic configuration. No abnormal intra or extra-axial mass lesion or fluid collection. No abnormal mass effect or midline shift. No evidence of acute intracranial hemorrhage or infarct. Ventricular size is normal. Cerebellum unremarkable. Vascular: Unremarkable Skull: Intact Sinuses/Orbits: Paranasal sinuses are clear. Orbits are unremarkable. Other: Mastoid air cells and middle ear cavities are clear. CT CERVICAL SPINE FINDINGS Alignment: Minimal retrolisthesis of C5 upon C6 and anterolisthesis of C3 upon C4 is likely degenerative in nature. Mild reversal of the normal cervical lordosis. Skull base and vertebrae: The craniocervical alignment is normal. The atlantodental interval is not widened. There is no acute fracture of the cervical spine. There is ankylosis of the facet joints of C2-3 bilaterally. Soft tissues and spinal  canal: Retrolisthesis at C5-6 in combination with small posterior disc osteophyte complex results in mild central canal stenosis. The spinal canal is otherwise widely patent. No canal hematoma. No prevertebral soft tissue swelling or fluid identified. No cervical adenopathy. Thyroid unremarkable. Disc levels: There is intervertebral disc space narrowing and endplate remodeling at C4-C6 in keeping with changes of advanced degenerative disc disease. Milder degenerative changes are noted at C3-4. Vertebral body height is preserved. Prevertebral soft tissues are not thickened on sagittal reformats. Review of the axial images demonstrates moderate bilateral neuroforaminal narrowing at C3-4, severe bilateral neuroforaminal narrowing at C4-5, moderate right and severe left neuroforaminal narrowing at C5-6, secondary to combination uncovertebral and facet arthrosis. Upper chest: Unremarkable Other: None IMPRESSION: No acute intracranial abnormality.  No calvarial fracture. No acute fracture or listhesis of the cervical spine. Multilevel degenerative disc and degenerative joint disease resulting in multilevel neuroforaminal narrowing as described above and mild central canal stenosis at C5-6. Electronically Signed   By: Helyn Numbers M.D.   On: 02/28/2021 02:04   CT Knee Left Wo Contrast  Result Date: 02/28/2021 CLINICAL DATA:  Fall, left knee injury EXAM: CT OF THE LEFT KNEE WITHOUT CONTRAST TECHNIQUE: Multidetector CT imaging of the left knee was performed according to the standard protocol. Multiplanar CT image reconstructions were also generated. COMPARISON:  None. FINDINGS: Bones/Joint/Cartilage Normal alignment. No fracture or dislocation. Mild to moderate tricompartmental arthritis, most severe within the medial compartment with joint space narrowing, subchondral sclerosis, and osteophyte formation. Degenerative changes within the patellofemoral compartment and lateral compartment are relatively mild with mild  joint space narrowing, asymmetrically involving the lateral facet within the patellofemoral compartment, and osteophyte formation. No lytic or blastic bone lesion. Ligaments Suboptimally assessed by CT. Muscles and Tendons Quadriceps and patellar tendons are intact. Biceps femoris tendon is intact. Popliteus tendon is intact. Tendons of the pes anserinus appear intact. Normal muscle bulk. Soft tissues There is moderate subcutaneous edema seen anterior to the patellar tendon, more focally at the level of the anterior tibial tubercle. Small left knee effusion is present. Mild vascular calcifications noted within the popliteal artery and visualized runoff. IMPRESSION:  No acute fracture or dislocation. Tricompartmental degenerative arthritis, most severe within the medial compartment. Prepatellar soft tissue swelling.  Small effusion. Electronically Signed   By: Helyn Numbers M.D.   On: 02/28/2021 02:11   CT Knee Right Wo Contrast  Result Date: 02/28/2021 CLINICAL DATA:  Fall, knee injury EXAM: CT OF THE RIGHT KNEE WITHOUT CONTRAST TECHNIQUE: Multidetector CT imaging of the right knee was performed according to the standard protocol. Multiplanar CT image reconstructions were also generated. COMPARISON:  None. FINDINGS: Bones/Joint/Cartilage Normal alignment. No fracture or dislocation. Mild-to-moderate tricompartmental degenerative arthritis, most severe within the medial compartment with joint space narrowing, subchondral sclerosis, and osteophyte formation. Degenerative changes within the patellofemoral compartment and lateral compartment are mild in severity. No lytic or blastic bone lesion. Ligaments Suboptimally assessed by CT. Muscles and Tendons Quadriceps and patellar tendons are intact. Biceps femoris tendon appears intact. Popliteus tendon appears intact. Tendons of the pes anserinus appear intact. Normal muscle bulk. Soft tissues There is moderate heterogeneous attenuation subcutaneous fluid seen  anterior to the patellar tendon likely represent a combination of edema and hemorrhage. Trace fluid within the suprapatellar recess is likely physiologic. Vascular calcifications are seen within the popliteal artery. IMPRESSION: No acute fracture or dislocation. 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 Moderate fluid anterior to the patellar tendon likely representing a combination of edema and subcutaneous hemorrhage. Electronically Signed   By: Helyn Numbers M.D.   On: 02/28/2021 02:17   DG Knee Complete 4 Views Left  Result Date: 02/27/2021 CLINICAL DATA:  Fall and trauma to the left knee. EXAM: LEFT KNEE - COMPLETE 4+ VIEW COMPARISON:  Left knee radiograph dated 07/23/2020. FINDINGS: There is no acute fracture or dislocation. There is mild arthritic changes with tricompartmental narrowing and spurring of the medial compartment. Small suprapatellar effusion. Abrasion of the skin and soft tissues anterior to the patellar tendon. IMPRESSION: 1. No acute fracture or dislocation. 2. Mild arthritic changes. Electronically Signed   By: Elgie Collard M.D.   On: 02/27/2021 22:49   DG Knee Complete 4 Views Right  Result Date: 02/27/2021 CLINICAL DATA:  Fall and trauma to the right knee. EXAM: RIGHT KNEE - COMPLETE 4+ VIEW COMPARISON:  Right knee radiograph dated 07/23/2020 FINDINGS: There is no acute fracture or dislocation the bones are well mineralized. No arthritic changes. No joint effusion. There is abrasion of the skin anterior to the patellar tendon. No radiopaque foreign object or soft tissue gas. IMPRESSION: No acute/traumatic osseous pathology. Electronically Signed   By: Elgie Collard M.D.   On: 02/27/2021 22:48   DG Hip Unilat W or Wo Pelvis 2-3 Views Right  Result Date: 02/28/2021 CLINICAL DATA:  Fall right hip pain EXAM: DG HIP (WITH OR WITHOUT PELVIS) 2-3V RIGHT COMPARISON:  None. FINDINGS: There is no evidence of hip fracture or dislocation. There is no evidence of  arthropathy or other focal bone abnormality. IMPRESSION: Negative. Electronically Signed   By: Helyn Numbers M.D.   On: 02/28/2021 02:18    Procedures Procedures   Medications Ordered in ED Medications  HYDROcodone-acetaminophen (NORCO/VICODIN) 5-325 MG per tablet 1 tablet (1 tablet Oral Given 02/27/21 2336)  ibuprofen (ADVIL) tablet 800 mg (800 mg Oral Given 02/27/21 2336)    ED Course  I have reviewed the triage vital signs and the nursing notes.  Pertinent labs & imaging results that were available during my care of the patient were reviewed by me and considered in my medical decision making (see chart for details).  MDM Rules/Calculators/A&P                          Mechanical fall with bilateral knee pain, head and neck injury and elbow pain.  Neurovascular intact  X-rays of knees and elbow in triage are negative.  With her head injury and neck pain, CT scans will be obtained.  Patient Left prior to the completion of her work-up while CT scan results were pending Patient did inform nursing staff that she was leaving as her ride had to go home.  Unable to speak with patient prior to her leaving She was able to ambulate without assistance.  After patient eloped, CT scans reviewed.  No acute traumatic injury to head and neck. CT of knees without fracture.  There is some subcutaneous edema and arthritis. No tibial plateau fractures. Final Clinical Impression(s) / ED Diagnoses Final diagnoses:  Fall, initial encounter  Multiple contusions    Rx / DC Orders ED Discharge Orders     None        Keven Osborn, Jeannett SeniorStephen, MD 02/28/21 [phone]

## 2021-02-27 NOTE — ED Triage Notes (Signed)
C/o mechanical fall from standing landing on tile floor hitting head and bil knees on tile

## 2021-02-28 ENCOUNTER — Emergency Department (HOSPITAL_BASED_OUTPATIENT_CLINIC_OR_DEPARTMENT_OTHER): Payer: Medicare Other

## 2021-02-28 DIAGNOSIS — M7989 Other specified soft tissue disorders: Secondary | ICD-10-CM | POA: Diagnosis not present

## 2021-02-28 DIAGNOSIS — S8992XA Unspecified injury of left lower leg, initial encounter: Secondary | ICD-10-CM | POA: Diagnosis not present

## 2021-02-28 DIAGNOSIS — S199XXA Unspecified injury of neck, initial encounter: Secondary | ICD-10-CM | POA: Diagnosis not present

## 2021-02-28 DIAGNOSIS — S8991XA Unspecified injury of right lower leg, initial encounter: Secondary | ICD-10-CM | POA: Diagnosis not present

## 2021-02-28 DIAGNOSIS — S0990XA Unspecified injury of head, initial encounter: Secondary | ICD-10-CM | POA: Diagnosis not present

## 2021-02-28 DIAGNOSIS — M25551 Pain in right hip: Secondary | ICD-10-CM | POA: Diagnosis not present

## 2021-02-28 NOTE — ED Notes (Signed)
Pt states she has to go home as her ride has to leave

## 2021-02-28 NOTE — ED Notes (Signed)
Report given to Lisa RN

## 2021-02-28 NOTE — ED Notes (Signed)
Patient transported to CT 

## 2021-03-01 ENCOUNTER — Ambulatory Visit: Payer: Medicare Other

## 2021-03-03 ENCOUNTER — Ambulatory Visit (INDEPENDENT_AMBULATORY_CARE_PROVIDER_SITE_OTHER): Payer: Medicare Other | Admitting: Family

## 2021-03-03 ENCOUNTER — Encounter: Payer: Self-pay | Admitting: Family

## 2021-03-03 ENCOUNTER — Other Ambulatory Visit: Payer: Self-pay

## 2021-03-03 VITALS — BP 139/75 | HR 82 | Temp 98.6°F | Resp 16 | Wt 172.0 lb

## 2021-03-03 DIAGNOSIS — Z23 Encounter for immunization: Secondary | ICD-10-CM | POA: Diagnosis not present

## 2021-03-03 DIAGNOSIS — W19XXXA Unspecified fall, initial encounter: Secondary | ICD-10-CM | POA: Diagnosis not present

## 2021-03-03 DIAGNOSIS — M25521 Pain in right elbow: Secondary | ICD-10-CM | POA: Diagnosis not present

## 2021-03-03 NOTE — Progress Notes (Signed)
Subjective:   By signing my name below, I, Lyric Barr-McArthur, attest that this documentation has been prepared under the direction and in the presence of Sandford Craze, NP, 03/03/2021    Patient ID: Nicole Douglas, female    DOB: [birth year], 75 y.o.   MRN: [id number]  Chief Complaint  Patient presents with   Follow-up    Here for ER follow up after falling    HPI Patient is in today for an office visit.  Fall: She fell by tripping over a mat in [employer] on 02/27/2021. She hit her head when she fell on a door that stopped her from falling further.  ED visit: She went to the ED after falling. She had an x-ray of done on her right arm and the results were normal. 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 Bilateral knee x-rays were also normal.  Immunizations: She is interested in receiving her flu shot during this visit.   Health Maintenance Due  Topic Date Due   FOOT EXAM  Never done   OPHTHALMOLOGY EXAM  Never done   Hepatitis C Screening  Never done   Zoster Vaccines- Shingrix (1 of 2) Never done   INFLUENZA VACCINE  01/19/2021    Past Medical History:  Diagnosis Date   Hyperglycemia    Hyperlipemia    Hypertension    Thyroid disease     Past Surgical History:  Procedure Laterality Date   ABDOMINAL HYSTERECTOMY     BACK SURGERY     BIOPSY BREAST     left breast   BREAST BIOPSY Left    benign    ROTATOR CUFF REPAIR     bilateral    Family History  Problem Relation Age of Onset   Hypertension Other        both sides of family   Heart disease Other    Cancer Brother        throat   Heart attack Mother        died at 22   Heart attack Sister        age 44   Prostate cancer Father     Social History   Socioeconomic History   Marital status: Married    Spouse name: Not on file   Number of children: Not on file   Years of education: Not on file   Highest  education level: Not on file  Occupational History   Not on file  Tobacco Use   Smoking status: Never   Smokeless tobacco: Never  Substance and Sexual Activity   Alcohol use: No   Drug use: No   Sexual activity: Not on file  Other Topics Concern   Not on file  Social History Narrative   Retired from [employer]   3 grown children (oldest daughter in [place], Daughter local, Son deceased was murdered)   Married   Enjoys reading, crossword, adult coloring, sewing    No pets   Social Determinants of Corporate investment banker Strain: Not on file  Food Insecurity: Not on file  Transportation Needs: Not on file  Physical Activity: Not on file  Stress: Not on file  Social Connections: Not on file  Intimate Partner Violence: Not on file    Outpatient Medications Prior to Visit  Medication Sig Dispense Refill   amLODipine (NORVASC) 10 MG tablet TAKE 1 TABLET BY MOUTH  DAILY 90 tablet 1   aspirin 81 MG tablet Take 81 mg by mouth daily.     atorvastatin (LIPITOR) 10 MG tablet TAKE 1 TABLET BY MOUTH  DAILY 90 tablet 1   Calcium Carbonate (CALTRATE 600 PO) Take by mouth daily.     COVID-19 mRNA Vac-TriS, Pfizer, SUSP injection Inject into the muscle. 0.3 mL 0   famotidine (PEPCID) 20 MG tablet TAKE 1 TABLET BY MOUTH  TWICE DAILY 180 tablet 1   levothyroxine (SYNTHROID) 50 MCG tablet TAKE 1 TABLET BY MOUTH ONCE DAILY EXCEPT ONE-HALF  TABLET BY MOUTH ON SUNDAYS 84 tablet 1   losartan (COZAAR) 50 MG tablet TAKE 2 TABLETS BY MOUTH  DAILY 180 tablet 1   meclizine (ANTIVERT) 25 MG tablet Take 1 tablet (25 mg total) by mouth 3 (three) times daily as needed for dizziness. 30 tablet 0   metFORMIN (GLUCOPHAGE) 500 MG tablet Take 1 tablet (500 mg total) by mouth 2 (two) times daily with a meal. 180 tablet 1   metFORMIN (GLUCOPHAGE) 500 MG tablet Take 1 tablet (500 mg total) by mouth 2 (two) times daily with a meal. 180 tablet 1   metoprolol tartrate (LOPRESSOR) 25 MG tablet TAKE 1 TABLET BY MOUTH   TWICE DAILY 180 tablet 1   Tdap (BOOSTRIX) 5-2.5-18.5 LF-MCG/0.5 injection Inject by pharmacist as directed 0.5 mL 0   Zoster Vaccine Adjuvanted [facility]) injection Inject 0.5mg  IM now and again in 2-6 months. 0.5 mL 1   No facility-administered medications prior to visit.    No Known Allergies  Review of Systems  Musculoskeletal:  Positive for falls.      Objective:    Physical Exam Constitutional:      General: She is not in acute distress.    Appearance: Normal appearance. She is not ill-appearing.  HENT:     Head: Normocephalic and atraumatic.     Right Ear: External ear normal.     Left Ear: External ear normal.  Eyes:     Extraocular Movements: Extraocular movements intact.     Pupils: Pupils are equal, round, and reactive to light.  Cardiovascular:     Rate and Rhythm: Normal rate and regular rhythm.     Heart sounds: Normal heart sounds. No murmur heard.   No gallop.  Pulmonary:     Effort: Pulmonary effort is normal. No respiratory distress.     Breath sounds: Normal breath sounds. No wheezing or rales.  Musculoskeletal:     Right knee: Swelling present.     Left knee: Swelling present.     Comments: Bilateral bruising and swelling both knees.   Skin:    General: Skin is warm and dry.  Neurological:     Mental Status: She is alert and oriented to person, place, and time.  Psychiatric:        Behavior: Behavior normal.        Judgment: Judgment normal.    BP 139/75 (BP Location: Right Arm, Patient Position: Sitting, Cuff Size: Small)   Pulse 82   Temp 98.6 F (37 C) (Oral)   Resp 16   Wt 172 lb (78 kg)   SpO2 99%   BMI 27.76 kg/m  Wt Readings from Last 3 Encounters:  03/03/21 172 lb (78 kg)  02/27/21 175 lb (79.4 kg)  12/23/20 176 lb (79.8 kg)       Assessment & Plan:   Problem List Items Addressed This Visit       Unprioritized   Fall -  Primary    Some residual neck soreness and bruising but otherwise doing well.         No orders of  the defined types were placed in this encounter.   I, Sandford Craze, NP, personally preformed the services described in this documentation.  All medical record entries made by the scribe were at my direction and in my presence.  I have reviewed the chart and discharge instructions (if applicable) and agree that the record reflects my personal performance and is accurate and complete. 03/03/2021   I,Lyric Barr-McArthur,acting as a Neurosurgeon for Lemont Fillers, NP.,have documented all relevant documentation on the behalf of Lemont Fillers, NP,as directed by  Lemont Fillers, NP while in the presence of Lemont Fillers, NP.    Lemont Fillers, NP

## 2021-03-03 NOTE — Assessment & Plan Note (Signed)
Some residual neck soreness and bruising but otherwise doing well.

## 2021-03-03 NOTE — Addendum Note (Signed)
Addended by: Wilford Corner on: 03/03/2021 09:47 AM   Modules accepted: Orders

## 2021-03-06 ENCOUNTER — Telehealth: Payer: Self-pay | Admitting: Family

## 2021-03-06 NOTE — Telephone Encounter (Signed)
Patient is calling stating she forgot to give Peggyann Juba her AIG claim number which is [id number] on her last visit. She also states it is under the claim number:[id number] and overseen by Grandville Silos. This has to do with the incident that happened at [facility] where she fell.

## 2021-03-06 NOTE — Telephone Encounter (Signed)
Called patient to find out if there is anything we need to do in regards to her claim but she is not sure. She will contact the person in charge of her claim for falling at [facility]. Patient was advise if they need the records from her last visit we need a signed consent for release.

## 2021-03-09 ENCOUNTER — Ambulatory Visit (INDEPENDENT_AMBULATORY_CARE_PROVIDER_SITE_OTHER): Payer: Medicare Other

## 2021-03-09 VITALS — Ht 66.0 in | Wt 172.0 lb

## 2021-03-09 DIAGNOSIS — Z0000 Encounter for general adult medical examination without abnormal findings: Secondary | ICD-10-CM

## 2021-03-09 NOTE — Patient Instructions (Signed)
Nicole Douglas , Thank you for taking time to complete your Medicare Wellness Visit. I appreciate your ongoing commitment to your health goals. Please review the following plan we discussed and let me know if I can assist you in the future.   Screening recommendations/referrals: Colonoscopy: No longer required Mammogram: Completed 12/11/2020-Due 12/11/2021. Bone Density:  Completed 12/11/2020-Due 12/12/2022. Recommended yearly ophthalmology/optometry visit for glaucoma screening and checkup Recommended yearly dental visit for hygiene and checkup  Vaccinations: Influenza vaccine: Up to date Pneumococcal vaccine: Up to date Tdap vaccine: Up to date-Due-09/24/2030 Shingles vaccine: Discuss with pharmacy   Covid-19:Newest booster available at the pharmacy.  Advanced directives: Information mailed today  Conditions/risks identified: See problem list  Next appointment: Follow up in one year for your annual wellness visit 03/11/2022 @ 9:00   Preventive Care 65 Years and Older, Female Preventive care refers to lifestyle choices and visits with your health care provider that can promote health and wellness. What does preventive care include? A yearly physical exam. This is also called an annual well check. Dental exams once or twice a year. Routine eye exams. Ask your health care provider how often you should have your eyes checked. Personal lifestyle choices, including: Daily care of your teeth and gums. Regular physical activity. Eating a healthy diet. Avoiding tobacco and drug use. Limiting alcohol use. Practicing safe sex. Taking low-dose aspirin every day. Taking vitamin and mineral supplements as recommended by your health care provider. What happens during an annual well check? The services and screenings done by your health care provider during your annual well check will depend on your age, overall health, lifestyle risk factors, and family history of disease. Counseling  Your health  care provider may ask you questions about your: Alcohol use. Tobacco use. Drug use. Emotional well-being. Home and relationship well-being. Sexual activity. Eating habits. History of falls. Memory and ability to understand (cognition). Work and work Astronomer. Reproductive health. Screening  You may have the following tests or measurements: Height, weight, and BMI. Blood pressure. Lipid and cholesterol levels. These may be checked every 5 years, or more frequently if you are over 64 years old. Skin check. Lung cancer screening. You may have this screening every year starting at age 80 if you have a 30-pack-year history of smoking and currently smoke or have quit within the past 15 years. Fecal occult blood test (FOBT) of the stool. You may have this test every year starting at age 68. Flexible sigmoidoscopy or colonoscopy. You may have a sigmoidoscopy every 5 years or a colonoscopy every 10 years starting at age 50. Hepatitis C blood test. Hepatitis B blood test. Sexually transmitted disease (STD) testing. Diabetes screening. This is done by checking your blood sugar (glucose) after you have not eaten for a while (fasting). You may have this done every 1-3 years. Bone density scan. This is done to screen for osteoporosis. You may have this done starting at age 58. Mammogram. This may be done every 1-2 years. Talk to your health care provider about how often you should have regular mammograms. Talk with your health care provider about your test results, treatment options, and if necessary, the need for more tests. Vaccines  Your health care provider may recommend certain vaccines, such as: Influenza vaccine. This is recommended every year. Tetanus, diphtheria, and acellular pertussis (Tdap, Td) vaccine. You may need a Td booster every 10 years. Zoster vaccine. You may need this after age 33. Pneumococcal 13-valent conjugate (PCV13) vaccine. One dose is recommended after  age  24. Pneumococcal polysaccharide (PPSV23) vaccine. One dose is recommended after age 48. Talk to your health care provider about which screenings and vaccines you need and how often you need them. This information is not intended to replace advice given to you by your health care provider. Make sure you discuss any questions you have with your health care provider. Document Released: 07/04/2015 Document Revised: 02/25/2016 Document Reviewed: 04/08/2015 Elsevier Interactive Patient Education  2017 [place] Prevention in the Home Falls can cause injuries. They can happen to people of all ages. There are many things you can do to make your home safe and to help prevent falls. What can I do on the outside of my home? Regularly fix the edges of walkways and driveways and fix any cracks. Remove anything that might make you trip as you walk through a door, such as a raised step or threshold. Trim any bushes or trees on the path to your home. Use bright outdoor lighting. Clear any walking paths of anything that might make someone trip, such as rocks or tools. Regularly check to see if handrails are loose or broken. Make sure that both sides of any steps have handrails. Any raised decks and porches should have guardrails on the edges. Have any leaves, snow, or ice cleared regularly. Use sand or salt on walking paths during winter. Clean up any spills in your garage right away. This includes oil or grease spills. What can I do in the bathroom? Use night lights. Install grab bars by the toilet and in the tub and shower. Do not use towel bars as grab bars. Use non-skid mats or decals in the tub or shower. If you need to sit down in the shower, use a plastic, non-slip stool. Keep the floor dry. Clean up any water that spills on the floor as soon as it happens. Remove soap buildup in the tub or shower regularly. Attach bath mats securely with double-sided non-slip rug tape. Do not have throw  rugs and other things on the floor that can make you trip. What can I do in the bedroom? Use night lights. Make sure that you have a light by your bed that is easy to reach. Do not use any sheets or blankets that are too big for your bed. They should not hang down onto the floor. Have a firm chair that has side arms. You can use this for support while you get dressed. Do not have throw rugs and other things on the floor that can make you trip. What can I do in the kitchen? Clean up any spills right away. Avoid walking on wet floors. Keep items that you use a lot in easy-to-reach places. If you need to reach something above you, use a strong step stool that has a grab bar. Keep electrical cords out of the way. Do not use floor polish or wax that makes floors slippery. If you must use wax, use non-skid floor wax. Do not have throw rugs and other things on the floor that can make you trip. What can I do with my stairs? Do not leave any items on the stairs. Make sure that there are handrails on both sides of the stairs and use them. Fix handrails that are broken or loose. Make sure that handrails are as long as the stairways. Check any carpeting to make sure that it is firmly attached to the stairs. Fix any carpet that is loose or worn. Avoid having throw rugs  at the top or bottom of the stairs. If you do have throw rugs, attach them to the floor with carpet tape. Make sure that you have a light switch at the top of the stairs and the bottom of the stairs. If you do not have them, ask someone to add them for you. What else can I do to help prevent falls? Wear shoes that: Do not have high heels. Have rubber bottoms. Are comfortable and fit you well. Are closed at the toe. Do not wear sandals. If you use a stepladder: Make sure that it is fully opened. Do not climb a closed stepladder. Make sure that both sides of the stepladder are locked into place. Ask someone to hold it for you, if  possible. Clearly mark and make sure that you can see: Any grab bars or handrails. First and last steps. Where the edge of each step is. Use tools that help you move around (mobility aids) if they are needed. These include: Canes. Walkers. Scooters. Crutches. Turn on the lights when you go into a dark area. Replace any light bulbs as soon as they burn out. Set up your furniture so you have a clear path. Avoid moving your furniture around. If any of your floors are uneven, fix them. If there are any pets around you, be aware of where they are. Review your medicines with your doctor. Some medicines can make you feel dizzy. This can increase your chance of falling. Ask your doctor what other things that you can do to help prevent falls. This information is not intended to replace advice given to you by your health care provider. Make sure you discuss any questions you have with your health care provider. Document Released: 04/03/2009 Document Revised: 11/13/2015 Document Reviewed: 07/12/2014 Elsevier Interactive Patient Education  2017 [employer].

## 2021-03-09 NOTE — Progress Notes (Signed)
Subjective:   Nicole Douglas is a 75 y.o. female who presents for Medicare Annual (Subsequent) preventive examination.  I connected with Nicole Douglas today by telephone and verified that I am speaking with the correct person using two identifiers. Location patient: home Location provider: work Persons participating in the virtual visit: patient, Engineer, civil (consulting).    I discussed the limitations, risks, security and privacy concerns of performing an evaluation and management service by telephone and the availability of in person appointments. I also discussed with the patient that there may be a patient responsible charge related to this service. The patient expressed understanding and verbally consented to this telephonic visit.    Interactive audio and video telecommunications were attempted between this provider and patient, however failed, due to patient having technical difficulties OR patient did not have access to video capability.  We continued and completed visit with audio only.  Some vital signs may be absent or patient reported.   Time Spent with patient on telephone encounter: 20 minutes   Review of Systems     Cardiac Risk Factors include: advanced age (>13men, >31 women);hypertension;dyslipidemia;diabetes mellitus     Objective:    Today's Vitals   03/09/21 0942 03/09/21 0943  Weight: 172 lb (78 kg)   Height: 5\' 6"  (1.676 m)   PainSc:  5    Body mass index is 27.76 kg/m.  Advanced Directives 03/09/2021 11/20/2018 12/28/2016  Does Patient Have a Medical Advance Directive? No No No  Would patient like information on creating a medical advance directive? Yes (MAU/Ambulatory/Procedural Areas - Information given) No - Patient declined -    Current Medications (verified) Outpatient Encounter Medications as of 03/09/2021  Medication Sig   amLODipine (NORVASC) 10 MG tablet TAKE 1 TABLET BY MOUTH  DAILY   aspirin 81 MG tablet Take 81 mg by mouth daily.   atorvastatin (LIPITOR) 10 MG tablet  TAKE 1 TABLET BY MOUTH  DAILY   Calcium Carbonate (CALTRATE 600 PO) Take by mouth daily.   famotidine (PEPCID) 20 MG tablet TAKE 1 TABLET BY MOUTH  TWICE DAILY   levothyroxine (SYNTHROID) 50 MCG tablet TAKE 1 TABLET BY MOUTH ONCE DAILY EXCEPT ONE-HALF  TABLET BY MOUTH ON SUNDAYS   losartan (COZAAR) 50 MG tablet TAKE 2 TABLETS BY MOUTH  DAILY   meclizine (ANTIVERT) 25 MG tablet Take 1 tablet (25 mg total) by mouth 3 (three) times daily as needed for dizziness.   metFORMIN (GLUCOPHAGE) 500 MG tablet Take 1 tablet (500 mg total) by mouth 2 (two) times daily with a meal.   metFORMIN (GLUCOPHAGE) 500 MG tablet Take 1 tablet (500 mg total) by mouth 2 (two) times daily with a meal.   metoprolol tartrate (LOPRESSOR) 25 MG tablet TAKE 1 TABLET BY MOUTH  TWICE DAILY   Tdap (BOOSTRIX) 5-2.5-18.5 LF-MCG/0.5 injection Inject by pharmacist as directed   Zoster Vaccine Adjuvanted [facility]) injection Inject 0.5mg  IM now and again in 2-6 months.   COVID-19 mRNA Vac-TriS, Pfizer, SUSP injection Inject into the muscle. (Patient not taking: Reported on 03/09/2021)   No facility-administered encounter medications on file as of 03/09/2021.    Allergies (verified) Patient has no known allergies.   History: Past Medical History:  Diagnosis Date   Hyperglycemia    Hyperlipemia    Hypertension    Thyroid disease    Past Surgical History:  Procedure Laterality Date   ABDOMINAL HYSTERECTOMY     BACK SURGERY     BIOPSY BREAST     left breast  BREAST BIOPSY Left    benign    ROTATOR CUFF REPAIR     bilateral   Family History  Problem Relation Age of Onset   Hypertension Other        both sides of family   Heart disease Other    Cancer Brother        throat   Heart attack Mother        died at 70   Heart attack Sister        age 47   Prostate cancer Father    Social History   Socioeconomic History   Marital status: Married    Spouse name: Not on file   Number of children: Not on file    Years of education: Not on file   Highest education level: Not on file  Occupational History   Not on file  Tobacco Use   Smoking status: Never   Smokeless tobacco: Never  Substance and Sexual Activity   Alcohol use: No   Drug use: No   Sexual activity: Not on file  Other Topics Concern   Not on file  Social History Narrative   Retired from [employer]   3 grown children (oldest daughter in [place], Daughter local, Son deceased was murdered)   Married   Enjoys reading, crossword, adult coloring, sewing    No pets   Social Determinants of Corporate investment banker Strain: Low Risk    Difficulty of Paying Living Expenses: Not hard at all  Food Insecurity: No Food Insecurity   Worried About Programme researcher, broadcasting/film/video in the Last Year: Never true   Barista in the Last Year: Never true  Transportation Needs: No Transportation Needs   Lack of Transportation (Medical): No   Lack of Transportation (Non-Medical): No  Physical Activity: Sufficiently Active   Days of Exercise per Week: 3 days   Minutes of Exercise per Session: 60 min  Stress: No Stress Concern Present   Feeling of Stress : Not at all  Social Connections: Moderately Integrated   Frequency of Communication with Friends and Family: More than three times a week   Frequency of Social Gatherings with Friends and Family: Once a week   Attends Religious Services: More than 4 times per year   Active Member of [employer] or Organizations: No   Attends Engineer, structural: Never   Marital Status: Married    Tobacco Counseling Counseling given: Not Answered   Clinical Intake:  Pre-visit preparation completed: Yes  Pain : 0-10 Pain Score: 5  Pain Type: Acute pain Pain Location: Knee (from fall last week) Pain Orientation: Right, Left Pain Onset: In the past 7 days Pain Frequency: Constant     BMI - recorded: 27.76 Nutritional Status: BMI 25 -29 Overweight Nutritional Risks: None Diabetes: No  How often  do you need to have someone help you when you read instructions, pamphlets, or other written materials from your doctor or pharmacy?: 1 - Never  Diabetes:  Is the patient diabetic?  Yes  If diabetic, was a CBG obtained today?  No  Did the patient bring in their glucometer from home?  No phone visit How often do you monitor your CBG's? never.   Financial Strains and Diabetes Management:  Are you having any financial strains with the device, your supplies or your medication? No .  Does the patient want to be seen by Chronic Care Management for management of their diabetes?  No  Would the patient like to be referred to a Nutritionist or for Diabetic Management?  No   Diabetic Exams:  Diabetic Eye Exam: Overdue for diabetic eye exam. Pt has been advised about the importance in completing this exam. Patient has an upcoming appt  Diabetic Foot Exam: Pt has been advised about the importance in completing this exam.  To be completed by PCP  Interpreter Needed?: No  Information entered by :: Thomasenia Sales LPN   Activities of Daily Living In your present state of health, do you have any difficulty performing the following activities: 03/09/2021 05/12/2020  Hearing? N N  Vision? N N  Difficulty concentrating or making decisions? N N  Walking or climbing stairs? N N  Dressing or bathing? N N  Doing errands, shopping? N N  Preparing Food and eating ? N -  Using the Toilet? N -  In the past six months, have you accidently leaked urine? N -  Do you have problems with loss of bowel control? N -  Managing your Medications? N -  Managing your Finances? N -  Housekeeping or managing your Housekeeping? N -  Some recent data might be hidden    Patient Care Team: Sandford Craze, NP as PCP - General (Internal Medicine) Sydnee Cabal., MD as Consulting Physician (Gastroenterology)  Indicate any recent Medical Services you may have received from other than [facility] providers in the past  year (date may be approximate).     Assessment:   This is a routine wellness examination for Par[place]et.  Hearing/Vision screen Hearing Screening - Comments:: No issues Vision Screening - Comments:: Wears glasses Last eye exam-2021-Dr. Logan Bores  Dietary issues and exercise activities discussed: Current Exercise Habits: Home exercise routine, Type of exercise: walking (stationary bike), Time (Minutes): 60, Frequency (Times/Week): 3, Weekly Exercise (Minutes/Week): 180, Intensity: Mild, Exercise limited by: None identified   Goals Addressed               This Visit's Progress     Patient Stated     Maintain current health (pt-stated)   On track      Depression Screen PHQ 2/9 Scores 03/09/2021 03/03/2021 12/23/2020 05/12/2020 11/20/2018 06/03/2017 04/13/2016  PHQ - 2 Score 0 0 0 0 0 0 0  PHQ- 9 Score - - - - - 0 -    Fall Risk Fall Risk  03/09/2021 03/03/2021 12/23/2020 05/12/2020 11/20/2018  Falls in the past year? 1 1 0 0 0  Number falls in past yr: 0 1 0 0 -  Injury with Fall? 1 0 0 0 -  Risk for fall due to : History of fall(s) - - - -  Follow up Falls prevention discussed - - - -    FALL RISK PREVENTION PERTAINING TO THE HOME:  Any stairs in or around the home? No  Home free of loose throw rugs in walkways, pet beds, electrical cords, etc? No  Adequate lighting in your home to reduce risk of falls? No   ASSISTIVE DEVICES UTILIZED TO PREVENT FALLS:  Life alert? No  Use of a cane, walker or w/c? No  Grab bars in the bathroom? Yes  Shower chair or bench in shower? No  Elevated toilet seat or a handicapped toilet? No   TIMED UP AND GO:  Was the test performed? No . Phone visit   Cognitive Function:Normal cognitive status assessed by this Nurse Health Advisor. No abnormalities found.          Immunizations Immunization History  Administered Date(s) Administered   Fluad Quad(high Dose 65+) 03/06/2019, 05/12/2020, 03/03/2021   Influenza, High Dose Seasonal PF 04/13/2016,  06/03/2017, 04/21/2018   Influenza,inj,Quad PF,6+ Mos 03/27/2014, 04/22/2015   PFIZER Comirnaty(Gray Top)Covid-19 Tri-Sucrose Vaccine 12/23/2020   PFIZER(Purple Top)SARS-COV-2 Vaccination 07/27/2019, 08/17/2019, 03/17/2020   Pneumococcal Conjugate-13 04/02/2015   Pneumococcal Polysaccharide-23 09/20/2013   Tdap 03/27/2008, 09/23/2020    TDAP status: Up to date  Flu Vaccine status: Up to date  Pneumococcal vaccine status: Up to date  Covid-19 vaccine status: Completed vaccines  Qualifies for Shingles Vaccine? Yes   Zostavax completed No   Shingrix Completed?: No.    Education has been provided regarding the importance of this vaccine. Patient has been advised to call insurance company to determine out of pocket expense if they have not yet received this vaccine. Advised may also receive vaccine at local pharmacy or Health Dept. Verbalized acceptance and understanding.  Screening Tests Health Maintenance  Topic Date Due   FOOT EXAM  Never done   OPHTHALMOLOGY EXAM  Never done   Hepatitis C Screening  Never done   Zoster Vaccines- Shingrix (1 of 2) Never done   HEMOGLOBIN A1C  06/25/2021   MAMMOGRAM  12/11/2021   DEXA SCAN  12/12/2022   TETANUS/TDAP  09/24/2030   INFLUENZA VACCINE  Completed   COVID-19 Vaccine  Completed   HPV VACCINES  Aged Out    Health Maintenance  Health Maintenance Due  Topic Date Due   FOOT EXAM  Never done   OPHTHALMOLOGY EXAM  Never done   Hepatitis C Screening  Never done   Zoster Vaccines- Shingrix (1 of 2) Never done    Colorectal cancer screening: No longer required.   Mammogram status: Completed bilateral 12/11/2020. Repeat every year  Bone Density status: Completed 12/11/2020. Results reflect: Bone density results: NORMAL. Repeat every 2 years.  Lung Cancer Screening: (Low Dose CT Chest recommended if Age 84-80 years, 30 pack-year currently smoking OR have quit w/in 15years.) does not qualify.    Additional Screening:  Hepatitis C  Screening: does qualify; Discuss with PCP  Vision Screening: Recommended annual ophthalmology exams for early detection of glaucoma and other disorders of the eye. Is the patient up to date with their annual eye exam?  No  Who is the provider or what is the name of the office in which the patient attends annual eye exams? Patient unsure of name-has an upcoming appt   Dental Screening: Recommended annual dental exams for proper oral hygiene  Community Resource Referral / Chronic Care Management: CRR required this visit?  No   CCM required this visit?  No      Plan:     I have personally reviewed and noted the following in the patient's chart:   Medical and social history Use of alcohol, tobacco or illicit drugs  Current medications and supplements including opioid prescriptions.  Functional ability and status Nutritional status Physical activity Advanced directives List of other physicians Hospitalizations, surgeries, and ER visits in previous 12 months Vitals Screenings to include cognitive, depression, and falls Referrals and appointments  In addition, I have reviewed and discussed with patient certain preventive protocols, quality metrics, and best practice recommendations. A written personalized care plan for preventive services as well as general preventive health recommendations were provided to patient.   Due to this being a telephonic visit, the after visit summary with patients personalized plan was offered to patient via mail or my-chart. Patient would like to access on my-chart.  Roanna Raider, LPN   [id number]  Nurse health Advisor  Nurse Notes: None

## 2021-03-12 DIAGNOSIS — M25562 Pain in left knee: Secondary | ICD-10-CM | POA: Diagnosis not present

## 2021-03-12 DIAGNOSIS — M25561 Pain in right knee: Secondary | ICD-10-CM | POA: Diagnosis not present

## 2021-03-25 ENCOUNTER — Other Ambulatory Visit (HOSPITAL_BASED_OUTPATIENT_CLINIC_OR_DEPARTMENT_OTHER): Payer: Self-pay

## 2021-03-25 ENCOUNTER — Other Ambulatory Visit: Payer: Self-pay

## 2021-03-25 ENCOUNTER — Ambulatory Visit (INDEPENDENT_AMBULATORY_CARE_PROVIDER_SITE_OTHER): Payer: Medicare Other | Admitting: Family

## 2021-03-25 DIAGNOSIS — I10 Essential (primary) hypertension: Secondary | ICD-10-CM

## 2021-03-25 DIAGNOSIS — E785 Hyperlipidemia, unspecified: Secondary | ICD-10-CM | POA: Diagnosis not present

## 2021-03-25 DIAGNOSIS — W19XXXD Unspecified fall, subsequent encounter: Secondary | ICD-10-CM | POA: Diagnosis not present

## 2021-03-25 DIAGNOSIS — E119 Type 2 diabetes mellitus without complications: Secondary | ICD-10-CM

## 2021-03-25 DIAGNOSIS — W19XXXA Unspecified fall, initial encounter: Secondary | ICD-10-CM

## 2021-03-25 DIAGNOSIS — E038 Other specified hypothyroidism: Secondary | ICD-10-CM | POA: Diagnosis not present

## 2021-03-25 LAB — BASIC METABOLIC PANEL
BUN: 15 mg/dL (ref 6–23)
CO2: 30 mEq/L (ref 19–32)
Calcium: 9.6 mg/dL (ref 8.4–10.5)
Chloride: 103 mEq/L (ref 96–112)
Creatinine, Ser: 0.87 mg/dL (ref 0.40–1.20)
GFR: 64.59 mL/min (ref >60.00)
Glucose, Bld: 150 mg/dL — ABNORMAL HIGH (ref 70–99)
Potassium: 4 mEq/L (ref 3.5–5.1)
Sodium: 141 mEq/L (ref 135–145)

## 2021-03-25 LAB — HEMOGLOBIN A1C: Hgb A1c MFr Bld: 7.9 % — ABNORMAL HIGH (ref 4.6–6.5)

## 2021-03-25 MED ORDER — ATORVASTATIN CALCIUM 10 MG PO TABS: 10.0000 mg | ORAL_TABLET | Freq: Every day | ORAL | 1 refills | Status: DC

## 2021-03-25 MED ORDER — METOPROLOL TARTRATE 25 MG PO TABS: 25.0000 mg | ORAL_TABLET | Freq: Two times a day (BID) | ORAL | 1 refills | Status: DC

## 2021-03-25 MED ORDER — LOSARTAN POTASSIUM 50 MG PO TABS: 100.0000 mg | ORAL_TABLET | Freq: Every day | ORAL | 1 refills | Status: DC

## 2021-03-25 NOTE — Assessment & Plan Note (Signed)
Lipids stable. Continue lipitor 10mg .

## 2021-03-25 NOTE — Patient Instructions (Signed)
Please complete lab work prior to leaving.   

## 2021-03-25 NOTE — Assessment & Plan Note (Signed)
BP is stable on metoprolol 25mg  bid and losartan 50mg .

## 2021-03-25 NOTE — Assessment & Plan Note (Signed)
Clinically stable, continue synthroid .

## 2021-03-25 NOTE — Assessment & Plan Note (Signed)
Last A1C was uncontrolled. Working on diet, continue metformin 500mg  bid. Obtain follow up A1C.

## 2021-03-25 NOTE — Progress Notes (Signed)
Subjective:     Patient ID: Nicole Douglas, female    DOB: 13-Jul-1945, 75 y.o.   MRN: [id number]  Chief Complaint  Patient presents with   Hyperlipidemia   Hypothyroidism   Hypertension   Follow-up    Hyperlipidemia  Hypertension   HTN- maintained on amlodipine 10mg  and losartan 50mg .  BP Readings from Last 3 Encounters:  03/25/21 122/70  03/03/21 139/75  02/28/21 (!) 157/84   Hyperlipidemia- Continues atorvastatin 10mg .  Lab Results  Component Value Date   CHOL 171 12/23/2020   HDL 51.90 12/23/2020   LDLCALC 102 (H) 12/23/2020   TRIG 84.0 12/23/2020   CHOLHDL 3 12/23/2020   Hypothyroid- reprots that she feels well on synthroid 50 mcg  Lab Results  Component Value Date   TSH 1.70 12/23/2020   DM2- reports that she is trying to eat less fried food.  Does not eat a lot of sugar.  She reports that her last eye exam was >1 year and has an appointment on 12/23. Lab Results  Component Value Date   HGBA1C 8.8 (H) 12/23/2020   HGBA1C 6.9 (H) 11/07/2019   HGBA1C 6.1 02/15/2018   Lab Results  Component Value Date   LDLCALC 102 (H) 12/23/2020   CREATININE 0.97 12/23/2020    Health Maintenance Due  Topic Date Due   OPHTHALMOLOGY EXAM  Never done   Hepatitis C Screening  Never done   Zoster Vaccines- Shingrix (1 of 2) Never done    Past Medical History:  Diagnosis Date   Hyperglycemia    Hyperlipemia    Hypertension    Thyroid disease     Past Surgical History:  Procedure Laterality Date   ABDOMINAL HYSTERECTOMY     BACK SURGERY     BIOPSY BREAST     left breast   BREAST BIOPSY Left    benign    ROTATOR CUFF REPAIR     bilateral    Family History  Problem Relation Age of Onset   Hypertension Other        both sides of family   Heart disease Other    Cancer Brother        throat   Heart attack Mother        died at 58   Heart attack Sister        age 58   Prostate cancer Father     Social History   Socioeconomic History   Marital  status: Married    Spouse name: Not on file   Number of children: Not on file   Years of education: Not on file   Highest education level: Not on file  Occupational History   Not on file  Tobacco Use   Smoking status: Never   Smokeless tobacco: Never  Substance and Sexual Activity   Alcohol use: No   Drug use: No   Sexual activity: Not on file  Other Topics Concern   Not on file  Social History Narrative   Retired from 02/23/2021   3 grown children (oldest daughter in [place], Daughter local, Son deceased was murdered)   Married   Enjoys reading, crossword, adult coloring, sewing    No pets   Social Determinants of 56 Strain: Low Risk    Difficulty of Paying Living Expenses: Not hard at all  Food Insecurity: No Food Insecurity   Worried About [employer] in the Last Year: Never true   [place] of  Food in the Last Year: Never true  Transportation Needs: No Transportation Needs   Lack of Transportation (Medical): No   Lack of Transportation (Non-Medical): No  Physical Activity: Sufficiently Active   Days of Exercise per Week: 3 days   Minutes of Exercise per Session: 60 min  Stress: No Stress Concern Present   Feeling of Stress : Not at all  Social Connections: Moderately Integrated   Frequency of Communication with Friends and Family: More than three times a week   Frequency of Social Gatherings with Friends and Family: Once a week   Attends Religious Services: More than 4 times per year   Active Member of [employer] or Organizations: No   Attends Engineer, structural: Never   Marital Status: Married  Catering manager Violence: Not At Risk   Fear of Current or Ex-Partner: No   Emotionally Abused: No   Physically Abused: No   Sexually Abused: No    Outpatient Medications Prior to Visit  Medication Sig Dispense Refill   amLODipine (NORVASC) 10 MG tablet TAKE 1 TABLET BY MOUTH  DAILY 90 tablet 1   aspirin 81 MG tablet Take 81 mg by  mouth daily.     Calcium Carbonate (CALTRATE 600 PO) Take by mouth daily.     famotidine (PEPCID) 20 MG tablet TAKE 1 TABLET BY MOUTH  TWICE DAILY 180 tablet 1   levothyroxine (SYNTHROID) 50 MCG tablet TAKE 1 TABLET BY MOUTH ONCE DAILY EXCEPT ONE-HALF  TABLET BY MOUTH ON SUNDAYS 84 tablet 1   meclizine (ANTIVERT) 25 MG tablet Take 1 tablet (25 mg total) by mouth 3 (three) times daily as needed for dizziness. 30 tablet 0   metFORMIN (GLUCOPHAGE) 500 MG tablet Take 1 tablet (500 mg total) by mouth 2 (two) times daily with a meal. 180 tablet 1   metFORMIN (GLUCOPHAGE) 500 MG tablet Take 1 tablet (500 mg total) by mouth 2 (two) times daily with a meal. 180 tablet 1   Tdap (BOOSTRIX) 5-2.5-18.5 LF-MCG/0.5 injection Inject by pharmacist as directed 0.5 mL 0   atorvastatin (LIPITOR) 10 MG tablet TAKE 1 TABLET BY MOUTH  DAILY 90 tablet 1   losartan (COZAAR) 50 MG tablet TAKE 2 TABLETS BY MOUTH  DAILY 180 tablet 1   metoprolol tartrate (LOPRESSOR) 25 MG tablet TAKE 1 TABLET BY MOUTH  TWICE DAILY 180 tablet 1   COVID-19 mRNA Vac-TriS, Pfizer, SUSP injection Inject into the muscle. (Patient not taking: No sig reported) 0.3 mL 0   Zoster Vaccine Adjuvanted [facility]) injection Inject 0.5mg  IM now and again in 2-6 months. (Patient not taking: Reported on 03/25/2021) 0.5 mL 1   No facility-administered medications prior to visit.    No Known Allergies  ROS     Objective:    Physical Exam Constitutional:      General: She is not in acute distress.    Appearance: Normal appearance. She is well-developed.  HENT:     Head: Normocephalic and atraumatic.     Right Ear: External ear normal.     Left Ear: External ear normal.  Eyes:     General: No scleral icterus. Neck:     Thyroid: No thyromegaly.  Cardiovascular:     Rate and Rhythm: Normal rate and regular rhythm.     Heart sounds: Normal heart sounds. No murmur heard. Pulmonary:     Effort: Pulmonary effort is normal. No respiratory distress.      Breath sounds: Normal breath sounds. No wheezing.  Musculoskeletal:  Cervical back: Neck supple.  Skin:    General: Skin is warm and dry.  Neurological:     Mental Status: She is alert and oriented to person, place, and time.  Psychiatric:        Mood and Affect: Mood normal.        Behavior: Behavior normal.        Thought Content: Thought content normal.        Judgment: Judgment normal.    BP 122/70 (BP Location: Left Arm, Patient Position: Sitting, Cuff Size: Normal)   Pulse 68   Temp 98 F (36.7 C) (Oral)   Resp 18   Ht 5\' 6"  (1.676 m)   Wt 173 lb 9.6 oz (78.7 kg)   SpO2 98%   BMI 28.02 kg/m  Wt Readings from Last 3 Encounters:  03/25/21 173 lb 9.6 oz (78.7 kg)  03/09/21 172 lb (78 kg)  03/03/21 172 lb (78 kg)   Diabetic Foot Exam - Simple   Simple Foot Form Diabetic Foot exam was performed with the following findings: Yes 03/25/2021  9:08 AM  Visual Inspection No deformities, no ulcerations, no other skin breakdown bilaterally: Yes Sensation Testing See comments: Yes Pulse Check Posterior Tibialis and Dorsalis pulse intact bilaterally: Yes Comments Some decreased sensation on plantar surface of both feet         Assessment & Plan:   Problem List Items Addressed This Visit       Unprioritized   Hypothyroidism    Clinically stable, continue synthroid 05/25/2021.       Relevant Medications   metoprolol tartrate (LOPRESSOR) 25 MG tablet   Hyperlipidemia    Lipids stable. Continue lipitor 10mg .       Relevant Medications   atorvastatin (LIPITOR) 10 MG tablet   losartan (COZAAR) 50 MG tablet   metoprolol tartrate (LOPRESSOR) 25 MG tablet   HTN (hypertension)    BP is stable on metoprolol 25mg  bid and losartan 50mg .       Relevant Medications   atorvastatin (LIPITOR) 10 MG tablet   losartan (COZAAR) 50 MG tablet   metoprolol tartrate (LOPRESSOR) 25 MG tablet   Fall    Still having some neck stiffness but knee pain is improving. Monitor.        Controlled type 2 diabetes mellitus without complication, without long-term current use of insulin (HCC)    Last A1C was uncontrolled. Working on diet, continue metformin 500mg  bid. Obtain follow up A1C.       Relevant Medications   atorvastatin (LIPITOR) 10 MG tablet   losartan (COZAAR) 50 MG tablet   Other Relevant Orders   Hemoglobin A1c   Basic metabolic panel    I have discontinued Shingrix and COVID-19 mRNA Vac-TriS ). I have also changed her atorvastatin, losartan, and metoprolol tartrate. Additionally, I am having her maintain her aspirin, Calcium Carbonate (CALTRATE 600 PO), meclizine, Tdap, metFORMIN, metFORMIN, amLODipine, levothyroxine, and famotidine.  Meds ordered this encounter  Medications   atorvastatin (LIPITOR) 10 MG tablet    Sig: Take 1 tablet (10 mg total) by mouth daily.    Dispense:  90 tablet    Refill:  1    Requesting 1 year supply    Order Specific Question:   Supervising Provider    Answer:   A [[id number]]   losartan (COZAAR) 50 MG tablet    Sig: Take 2 tablets (100 mg total) by mouth daily.    Dispense:  180 tablet  Refill:  1    Requesting 1 year supply    Order Specific Question:   Supervising Provider    Answer:   Danise Edge A [[id number]]   metoprolol tartrate (LOPRESSOR) 25 MG tablet    Sig: Take 1 tablet (25 mg total) by mouth 2 (two) times daily.    Dispense:  180 tablet    Refill:  1    Requesting 1 year supply    Order Specific Question:   Supervising Provider    Answer:   Danise Edge A [[id number]]

## 2021-03-25 NOTE — Assessment & Plan Note (Signed)
Still having some neck stiffness but knee pain is improving. Monitor.

## 2021-06-17 DIAGNOSIS — H40003 Preglaucoma, unspecified, bilateral: Secondary | ICD-10-CM | POA: Diagnosis not present

## 2021-06-17 DIAGNOSIS — Z961 Presence of intraocular lens: Secondary | ICD-10-CM | POA: Diagnosis not present

## 2021-06-17 DIAGNOSIS — H2511 Age-related nuclear cataract, right eye: Secondary | ICD-10-CM | POA: Diagnosis not present

## 2021-06-17 DIAGNOSIS — H25011 Cortical age-related cataract, right eye: Secondary | ICD-10-CM | POA: Diagnosis not present

## 2021-06-29 ENCOUNTER — Other Ambulatory Visit: Payer: Self-pay | Admitting: Family

## 2021-07-09 ENCOUNTER — Other Ambulatory Visit: Payer: Self-pay | Admitting: Family

## 2021-08-17 ENCOUNTER — Other Ambulatory Visit: Payer: Self-pay | Admitting: Family

## 2021-12-04 ENCOUNTER — Other Ambulatory Visit: Payer: Self-pay | Admitting: Family

## 2021-12-04 MED ORDER — METFORMIN HCL 500 MG PO TABS: 500.0000 mg | ORAL_TABLET | Freq: Two times a day (BID) | ORAL | 0 refills | Status: DC

## 2021-12-05 ENCOUNTER — Other Ambulatory Visit: Payer: Self-pay | Admitting: Family

## 2021-12-15 ENCOUNTER — Ambulatory Visit (INDEPENDENT_AMBULATORY_CARE_PROVIDER_SITE_OTHER): Payer: Medicare Other | Admitting: Family

## 2021-12-15 ENCOUNTER — Ambulatory Visit: Payer: Medicare Other | Attending: Internal Medicine

## 2021-12-15 VITALS — BP 137/72 | HR 73 | Temp 98.2°F | Resp 16 | Wt 173.0 lb

## 2021-12-15 DIAGNOSIS — E038 Other specified hypothyroidism: Secondary | ICD-10-CM | POA: Diagnosis not present

## 2021-12-15 DIAGNOSIS — E785 Hyperlipidemia, unspecified: Secondary | ICD-10-CM | POA: Diagnosis not present

## 2021-12-15 DIAGNOSIS — I10 Essential (primary) hypertension: Secondary | ICD-10-CM | POA: Diagnosis not present

## 2021-12-15 DIAGNOSIS — E119 Type 2 diabetes mellitus without complications: Secondary | ICD-10-CM | POA: Diagnosis not present

## 2021-12-15 DIAGNOSIS — Z23 Encounter for immunization: Secondary | ICD-10-CM

## 2021-12-15 DIAGNOSIS — N8110 Cystocele, unspecified: Secondary | ICD-10-CM | POA: Insufficient documentation

## 2021-12-15 MED ORDER — FAMOTIDINE 20 MG PO TABS: 20.0000 mg | ORAL_TABLET | Freq: Two times a day (BID) | ORAL | 1 refills | Status: DC

## 2021-12-15 MED ORDER — ATORVASTATIN CALCIUM 10 MG PO TABS: 10.0000 mg | ORAL_TABLET | Freq: Every day | ORAL | 1 refills | Status: DC

## 2021-12-15 NOTE — Progress Notes (Signed)
Subjective:   By signing my name below, I, Nicole Douglas, attest that this documentation has been prepared under the direction and in the presence of Nicole Douglas' [place], NP 12/15/2021      Patient ID: Nicole Douglas, female    DOB: 09/24/1945, 76 y.o.   MRN: [id number]  Chief Complaint  Patient presents with   Vaginal Prolapse    Patient complains of "something bulging out from vaginal orifice    HPI Patient is in today for an office visit.  ? Vaginal Prolapse: She complains of possible vaginal prolapse. She states that she notices a bulge in her vagina which worsens during urination. The other day she noted that this bulge interrupted her urinary stream. She reports that she has had a hysterectomy. She states that she has rectal pains only when she sits.   Blood Sugars: She does not have a regular endocrinologist.  Lab Results  Component Value Date   HGBA1C 7.9 (H) 03/25/2021   Health Maintenance Due  Topic Date Due   OPHTHALMOLOGY EXAM  Never done   Hepatitis C Screening  Never done   Zoster Vaccines- Shingrix (1 of 2) Never done   HEMOGLOBIN A1C  09/23/2021   MAMMOGRAM  12/11/2021    Past Medical History:  Diagnosis Date   Hyperglycemia    Hyperlipemia    Hypertension    Thyroid disease     Past Surgical History:  Procedure Laterality Date   ABDOMINAL HYSTERECTOMY     BACK SURGERY     BIOPSY BREAST     left breast   BREAST BIOPSY Left    benign    ROTATOR CUFF REPAIR     bilateral    Family History  Problem Relation Age of Onset   Hypertension Other        both sides of family   Heart disease Other    Cancer Brother        throat   Heart attack Mother        died at 29   Heart attack Sister        age 92   Prostate cancer Father     Social History   Socioeconomic History   Marital status: Married    Spouse name: Not on file   Number of children: Not on file   Years of education: Not on file   Highest education level: Not on file   Occupational History   Not on file  Tobacco Use   Smoking status: Never   Smokeless tobacco: Never  Substance and Sexual Activity   Alcohol use: No   Drug use: No   Sexual activity: Not on file  Other Topics Concern   Not on file  Social History Narrative   Retired from [employer]   3 grown children (oldest daughter in [place], Daughter local, Son deceased was murdered)   Married   Enjoys reading, crossword, adult coloring, sewing    No pets   Social Determinants of Health   Financial Resource Strain: Low Risk  (03/09/2021)   Overall Financial Resource Strain (CARDIA)    Difficulty of Paying Living Expenses: Not hard at all  Food Insecurity: No Food Insecurity (03/09/2021)   Hunger Vital Sign    Worried About Running Out of Food in the Last Year: Never true    Ran Out of Food in the Last Year: Never true  Transportation Needs: No Transportation Needs (03/09/2021)   PRAPARE - Transportation    Lack  of Transportation (Medical): No    Lack of Transportation (Non-Medical): No  Physical Activity: Sufficiently Active (03/09/2021)   Exercise Vital Sign    Days of Exercise per Week: 3 days    Minutes of Exercise per Session: 60 min  Stress: No Stress Concern Present (03/09/2021)   [employer] of Occupational Health - Occupational Stress Questionnaire    Feeling of Stress : Not at all  Social Connections: Moderately Integrated (03/09/2021)   Social Connection and Isolation Panel [NHANES]    Frequency of Communication with Friends and Family: More than three times a week    Frequency of Social Gatherings with Friends and Family: Once a week    Attends Religious Services: More than 4 times per year    Active Member of [employer] or Organizations: No    Attends Banker Meetings: Never    Marital Status: Married  Catering manager Violence: Not At Risk (03/09/2021)   Humiliation, Afraid, Rape, and Kick questionnaire    Fear of Current or Ex-Partner: No    Emotionally Abused:  No    Physically Abused: No    Sexually Abused: No    Outpatient Medications Prior to Visit  Medication Sig Dispense Refill   amLODipine (NORVASC) 10 MG tablet TAKE 1 TABLET BY MOUTH  DAILY 90 tablet 3   aspirin 81 MG tablet Take 81 mg by mouth daily.     atorvastatin (LIPITOR) 10 MG tablet TAKE 1 TABLET BY MOUTH DAILY 90 tablet 0   Calcium Carbonate (CALTRATE 600 PO) Take by mouth daily.     famotidine (PEPCID) 20 MG tablet TAKE 1 TABLET BY MOUTH  TWICE DAILY 180 tablet 3   levothyroxine (SYNTHROID) 50 MCG tablet TAKE 1 TABLET BY MOUTH ONCE  DAILY EXCEPT ONE-HALF TABLET BY  MOUTH ON SUNDAYS 94 tablet 2   losartan (COZAAR) 50 MG tablet TAKE 2 TABLETS BY MOUTH DAILY 180 tablet 0   meclizine (ANTIVERT) 25 MG tablet Take 1 tablet (25 mg total) by mouth 3 (three) times daily as needed for dizziness. 30 tablet 0   metFORMIN (GLUCOPHAGE) 500 MG tablet Take 1 tablet (500 mg total) by mouth 2 (two) times daily with a meal. 180 tablet 0   metoprolol tartrate (LOPRESSOR) 25 MG tablet TAKE 1 TABLET BY MOUTH TWICE  DAILY 180 tablet 0   Tdap (BOOSTRIX) 5-2.5-18.5 LF-MCG/0.5 injection Inject by pharmacist as directed 0.5 mL 0   No facility-administered medications prior to visit.    No Known Allergies  ROS See mychart     Objective:    Physical Exam Constitutional:      General: She is not in acute distress.    Appearance: Normal appearance. She is not ill-appearing.  HENT:     Head: Normocephalic and atraumatic.     Right Ear: External ear normal.     Left Ear: External ear normal.  Eyes:     Extraocular Movements: Extraocular movements intact.     Pupils: Pupils are equal, round, and reactive to light.  Genitourinary:    Comments: Stage 3 bladder prolapse  Skin:    General: Skin is warm and dry.  Neurological:     Mental Status: She is alert and oriented to person, place, and time.  Psychiatric:        Mood and Affect: Mood normal.        Behavior: Behavior normal.         Judgment: Judgment normal.     BP 137/72 (BP Location: Right  Arm, Patient Position: Sitting, Cuff Size: Small)   Pulse 73   Temp 98.2 F (36.8 C) (Oral)   Resp 16   Wt 173 lb (78.5 kg)   SpO2 99%   BMI 27.92 kg/m  Wt Readings from Last 3 Encounters:  12/15/21 173 lb (78.5 kg)  03/25/21 173 lb 9.6 oz (78.7 kg)  03/09/21 172 lb (78 kg)       Assessment & Plan:   Problem List Items Addressed This Visit       Unprioritized   Hypothyroidism    Clinically stable on synthroid. Obtain follow up tsh.  Lab Results  Component Value Date   TSH 1.70 12/23/2020          Relevant Orders   TSH   Hyperlipidemia - Primary    Lab Results  Component Value Date   CHOL 171 12/23/2020   HDL 51.90 12/23/2020   LDLCALC 102 (H) 12/23/2020   TRIG 84.0 12/23/2020   CHOLHDL 3 12/23/2020  Due for follow up lipid panel. Continue atorvastatin.       Relevant Orders   Lipid panel   HTN (hypertension)    BP Readings from Last 3 Encounters:  12/15/21 137/72  03/25/21 122/70  03/03/21 139/75  BP stable on amlodipine and metoprolol.       Controlled type 2 diabetes mellitus without complication, without long-term current use of insulin (HCC)    Lab Results  Component Value Date   HGBA1C 7.9 (H) 03/25/2021  Maintained on metformin, last a1c above goal. Repeat A1C today.       Relevant Orders   Hemoglobin A1c   Comp Met (CMET)   Bladder prolapse, female, acquired    New. Will refer to Urogyn for further evaluation.       Relevant Orders   Ambulatory referral to Urogynecology    No orders of the defined types were placed in this encounter.   I, Lemont Fillers, NP, personally preformed the services described in this documentation.  All medical record entries made by the scribe were at my direction and in my presence.  I have reviewed the chart and discharge instructions (if applicable) and agree that the record reflects my personal performance and is accurate and  complete. 12/15/2021   I,Amber Collins,acting as a Neurosurgeon for Lemont Fillers, NP.,have documented all relevant documentation on the behalf of Lemont Fillers, NP,as directed by  Lemont Fillers, NP while in the presence of Lemont Fillers, NP.  Lemont Fillers, NP

## 2021-12-15 NOTE — Assessment & Plan Note (Addendum)
Lab Results  Component Value Date   HGBA1C 7.9 (H) 03/25/2021   Maintained on metformin, last a1c above goal. Repeat A1C today.

## 2021-12-16 ENCOUNTER — Other Ambulatory Visit (HOSPITAL_BASED_OUTPATIENT_CLINIC_OR_DEPARTMENT_OTHER): Payer: Self-pay

## 2021-12-16 ENCOUNTER — Telehealth: Payer: Self-pay | Admitting: Family

## 2021-12-16 LAB — LIPID PANEL
Cholesterol: 192 mg/dL (ref 0–200)
HDL: 64.8 mg/dL (ref >39.00)
LDL Cholesterol: 101 mg/dL — ABNORMAL HIGH (ref 0–99)
NonHDL: 127.5
Total CHOL/HDL Ratio: 3
Triglycerides: 135 mg/dL (ref 0.0–149.0)
VLDL: 27 mg/dL (ref 0.0–40.0)

## 2021-12-16 LAB — COMPREHENSIVE METABOLIC PANEL
ALT: 24 U/L (ref 0–35)
AST: 15 U/L (ref 0–37)
Albumin: 4.3 g/dL (ref 3.5–5.2)
Alkaline Phosphatase: 106 U/L (ref 39–117)
BUN: 15 mg/dL (ref 6–23)
CO2: 29 mEq/L (ref 19–32)
Calcium: 9.7 mg/dL (ref 8.4–10.5)
Chloride: 103 mEq/L (ref 96–112)
Creatinine, Ser: 0.91 mg/dL (ref 0.40–1.20)
GFR: 60.89 mL/min (ref >60.00)
Glucose, Bld: 145 mg/dL — ABNORMAL HIGH (ref 70–99)
Potassium: 4.2 mEq/L (ref 3.5–5.1)
Sodium: 141 mEq/L (ref 135–145)
Total Bilirubin: 1.1 mg/dL (ref 0.2–1.2)
Total Protein: 7.1 g/dL (ref 6.0–8.3)

## 2021-12-16 LAB — TSH: TSH: 1.41 u[IU]/mL (ref 0.35–5.50)

## 2021-12-16 LAB — HEMOGLOBIN A1C: Hgb A1c MFr Bld: 8.4 % — ABNORMAL HIGH (ref 4.6–6.5)

## 2021-12-16 MED ORDER — SITAGLIPTIN PHOSPHATE 100 MG PO TABS
100.0000 mg | ORAL_TABLET | Freq: Every day | ORAL | 3 refills | Status: DC
  Filled 2021-12-16: qty 30, 30d supply, fill #0

## 2021-12-16 NOTE — Telephone Encounter (Signed)
A1C has risen. I would like her to add [place] 100mg  once daily for diabetes. Thyroid, cholesterol and kidney function look good.

## 2021-12-16 NOTE — Telephone Encounter (Signed)
Called but no answer, lvm for patient to call back to go over results

## 2021-12-17 ENCOUNTER — Other Ambulatory Visit: Payer: Self-pay

## 2021-12-17 ENCOUNTER — Other Ambulatory Visit (HOSPITAL_BASED_OUTPATIENT_CLINIC_OR_DEPARTMENT_OTHER): Payer: Self-pay

## 2021-12-17 MED ORDER — SITAGLIPTIN PHOSPHATE 100 MG PO TABS: 100.0000 mg | ORAL_TABLET | Freq: Every day | ORAL | 3 refills | Status: DC

## 2021-12-17 NOTE — Progress Notes (Signed)
   Covid-19 Vaccination Clinic  Name:  Nicole Douglas    MRN: [id number] DOB: December 30, 1945  12/17/2021  [name] was observed post Covid-19 immunization for 15 minutes without incident. She was provided with Vaccine Information Sheet and instruction to access the V-Safe system.   [name] was instructed to call 911 with any severe reactions post vaccine: Difficulty breathing  Swelling of face and throat  A fast heartbeat  A bad rash all over body  Dizziness and weakness   Immunizations Administered     Name Date Dose VIS Date Route   Pfizer Covid-19 Vaccine Bivalent Booster 12/15/2021  2:35 PM 0.3 mL 02/18/2021 Intramuscular   Manufacturer: [employer], [employer]   Lot: [id number]   NDC: [phone]

## 2021-12-17 NOTE — Telephone Encounter (Signed)
Patient advised of results and new prescription 

## 2021-12-21 ENCOUNTER — Other Ambulatory Visit (HOSPITAL_BASED_OUTPATIENT_CLINIC_OR_DEPARTMENT_OTHER): Payer: Self-pay

## 2021-12-21 MED ORDER — PFIZER COVID-19 VAC BIVALENT 30 MCG/0.3ML IM SUSP
INTRAMUSCULAR | 0 refills | Status: DC
  Filled 2021-12-21: qty 0.3, 1d supply, fill #0

## 2021-12-30 DIAGNOSIS — H2511 Age-related nuclear cataract, right eye: Secondary | ICD-10-CM | POA: Diagnosis not present

## 2021-12-30 DIAGNOSIS — H35363 Drusen (degenerative) of macula, bilateral: Secondary | ICD-10-CM | POA: Diagnosis not present

## 2021-12-30 DIAGNOSIS — H52203 Unspecified astigmatism, bilateral: Secondary | ICD-10-CM | POA: Diagnosis not present

## 2021-12-30 DIAGNOSIS — H40003 Preglaucoma, unspecified, bilateral: Secondary | ICD-10-CM | POA: Diagnosis not present

## 2021-12-30 DIAGNOSIS — H524 Presbyopia: Secondary | ICD-10-CM | POA: Diagnosis not present

## 2021-12-30 DIAGNOSIS — H5203 Hypermetropia, bilateral: Secondary | ICD-10-CM | POA: Diagnosis not present

## 2021-12-30 DIAGNOSIS — H47393 Other disorders of optic disc, bilateral: Secondary | ICD-10-CM | POA: Diagnosis not present

## 2021-12-30 DIAGNOSIS — H25011 Cortical age-related cataract, right eye: Secondary | ICD-10-CM | POA: Diagnosis not present

## 2021-12-30 LAB — HM DIABETES EYE EXAM

## 2022-01-14 DIAGNOSIS — R3912 Poor urinary stream: Secondary | ICD-10-CM | POA: Diagnosis not present

## 2022-01-14 DIAGNOSIS — R350 Frequency of micturition: Secondary | ICD-10-CM | POA: Diagnosis not present

## 2022-01-14 DIAGNOSIS — N8111 Cystocele, midline: Secondary | ICD-10-CM | POA: Diagnosis not present

## 2022-02-17 ENCOUNTER — Other Ambulatory Visit: Payer: Self-pay | Admitting: Family

## 2022-02-22 ENCOUNTER — Other Ambulatory Visit: Payer: Self-pay | Admitting: Family

## 2022-03-04 DIAGNOSIS — E119 Type 2 diabetes mellitus without complications: Secondary | ICD-10-CM | POA: Diagnosis not present

## 2022-03-11 ENCOUNTER — Ambulatory Visit (INDEPENDENT_AMBULATORY_CARE_PROVIDER_SITE_OTHER): Payer: Medicare Other | Admitting: *Deleted

## 2022-03-11 ENCOUNTER — Encounter: Payer: Self-pay | Admitting: Family Medicine

## 2022-03-11 ENCOUNTER — Telehealth (INDEPENDENT_AMBULATORY_CARE_PROVIDER_SITE_OTHER): Payer: Medicare Other | Admitting: Family Medicine

## 2022-03-11 DIAGNOSIS — U071 COVID-19: Secondary | ICD-10-CM | POA: Diagnosis not present

## 2022-03-11 DIAGNOSIS — Z0000 Encounter for general adult medical examination without abnormal findings: Secondary | ICD-10-CM

## 2022-03-11 MED ORDER — BENZONATATE 100 MG PO CAPS: 100.0000 mg | ORAL_CAPSULE | Freq: Three times a day (TID) | ORAL | 0 refills | Status: DC | PRN

## 2022-03-11 MED ORDER — MOLNUPIRAVIR EUA 200MG CAPSULE: 4.0000 | ORAL_CAPSULE | Freq: Two times a day (BID) | ORAL | 0 refills | Status: AC

## 2022-03-11 NOTE — Progress Notes (Signed)
Chief Complaint  Patient presents with   Covid Positive    Jack Quarto here for URI complaints. Due to COVID-19 pandemic, we are interacting via web portal for an electronic face-to-face visit. I verified patient's ID using 2 identifiers. Patient agreed to proceed with visit via this method. Patient is at home, I am at office. Patient and I are present for visit.   Duration: 2 days  Associated symptoms: myalgia, slight loss of taste, coughing, fatigue Denies: sinus congestion, sinus pain, rhinorrhea, itchy watery eyes, ear pain, ear drainage, sore throat, wheezing, shortness of breath, and fevers, N/V/D, loss of smell Treatment to date: cough syrup, Tylenol Sick contacts: Yes- spouse Tested + for covid yesterday.   Past Medical History:  Diagnosis Date   Hyperglycemia    Hyperlipemia    Hypertension    Thyroid disease     Objective No conversational dyspnea Age appropriate judgment and insight Nml affect and mood  COVID-19 - Plan: molnupiravir EUA (LAGEVRIO) 200 mg CAPS capsule, benzonatate (TESSALON) 100 MG capsule  5 d course of molnupiravir. Tessalo Perles prn. Continue to push fluids, practice good hand hygiene, cover mouth when coughing. CDC quarantining guidelines discussed.  F/u prn. If starting to experience irreplaceable fluid loss, shaking, or shortness of breath, seek immediate care. Pt voiced understanding and agreement to the plan.  Gage, DO 03/11/22 4:25 PM

## 2022-03-11 NOTE — Patient Instructions (Signed)
Nicole Douglas , Thank you for taking time to come for your Medicare Wellness Visit. I appreciate your ongoing commitment to your health goals. Please review the following plan we discussed and let me know if I can assist you in the future.   These are the goals we discussed:  Goals       Maintain current health (pt-stated)        This is a list of the screening recommended for you and due dates:  Health Maintenance  Topic Date Due   Eye exam for diabetics  Never done   Hepatitis C Screening: USPSTF Recommendation to screen - Ages 66-79 yo.  Never done   Zoster (Shingles) Vaccine (1 of 2) Never done   Yearly kidney health urinalysis for diabetes  08/08/2021   Mammogram  12/11/2021   Flu Shot  01/19/2022   Complete foot exam   03/25/2022   COVID-19 Vaccine (6 - Pfizer series) 04/16/2022   Hemoglobin A1C  06/16/2022   DEXA scan (bone density measurement)  12/12/2022   Yearly kidney function blood test for diabetes  12/16/2022   Tetanus Vaccine  09/24/2030   Pneumonia Vaccine  Completed   HPV Vaccine  Aged Out   Colon Cancer Screening  Discontinued      Next appointment: Follow up in one year for your annual wellness visit    Preventive Care 65 Years and Older, Female Preventive care refers to lifestyle choices and visits with your health care provider that can promote health and wellness. What does preventive care include? A yearly physical exam. This is also called an annual well check. Dental exams once or twice a year. Routine eye exams. Ask your health care provider how often you should have your eyes checked. Personal lifestyle choices, including: Daily care of your teeth and gums. Regular physical activity. Eating a healthy diet. Avoiding tobacco and drug use. Limiting alcohol use. Practicing safe sex. Taking low-dose aspirin every day. Taking vitamin and mineral supplements as recommended by your health care provider. What happens during an annual well check? The  services and screenings done by your health care provider during your annual well check will depend on your age, overall health, lifestyle risk factors, and family history of disease. Counseling  Your health care provider may ask you questions about your: Alcohol use. Tobacco use. Drug use. Emotional well-being. Home and relationship well-being. Sexual activity. Eating habits. History of falls. Memory and ability to understand (cognition). Work and work Statistician. Reproductive health. Screening  You may have the following tests or measurements: Height, weight, and BMI. Blood pressure. Lipid and cholesterol levels. These may be checked every 5 years, or more frequently if you are over 87 years old. Skin check. Lung cancer screening. You may have this screening every year starting at age 61 if you have a 30-pack-year history of smoking and currently smoke or have quit within the past 15 years. Fecal occult blood test (FOBT) of the stool. You may have this test every year starting at age 34. Flexible sigmoidoscopy or colonoscopy. You may have a sigmoidoscopy every 5 years or a colonoscopy every 10 years starting at age 64. Hepatitis C blood test. Hepatitis B blood test. Sexually transmitted disease (STD) testing. Diabetes screening. This is done by checking your blood sugar (glucose) after you have not eaten for a while (fasting). You may have this done every 1-3 years. Bone density scan. This is done to screen for osteoporosis. You may have this done starting at age  65. Mammogram. This may be done every 1-2 years. Talk to your health care provider about how often you should have regular mammograms. Talk with your health care provider about your test results, treatment options, and if necessary, the need for more tests. Vaccines  Your health care provider may recommend certain vaccines, such as: Influenza vaccine. This is recommended every year. Tetanus, diphtheria, and acellular  pertussis (Tdap, Td) vaccine. You may need a Td booster every 10 years. Zoster vaccine. You may need this after age 17. Pneumococcal 13-valent conjugate (PCV13) vaccine. One dose is recommended after age 46. Pneumococcal polysaccharide (PPSV23) vaccine. One dose is recommended after age 74. Talk to your health care provider about which screenings and vaccines you need and how often you need them. This information is not intended to replace advice given to you by your health care provider. Make sure you discuss any questions you have with your health care provider. Document Released: 07/04/2015 Document Revised: 02/25/2016 Document Reviewed: 04/08/2015 Elsevier Interactive Patient Education  2017 [employer].  Fall Prevention in the Home Falls can cause injuries. They can happen to people of all ages. There are many things you can do to make your home safe and to help prevent falls. What can I do on the outside of my home? Regularly fix the edges of walkways and driveways and fix any cracks. Remove anything that might make you trip as you walk through a door, such as a raised step or threshold. Trim any bushes or trees on the path to your home. Use bright outdoor lighting. Clear any walking paths of anything that might make someone trip, such as rocks or tools. Regularly check to see if handrails are loose or broken. Make sure that both sides of any steps have handrails. Any raised decks and porches should have guardrails on the edges. Have any leaves, snow, or ice cleared regularly. Use sand or salt on walking paths during winter. Clean up any spills in your garage right away. This includes oil or grease spills. What can I do in the bathroom? Use night lights. Install grab bars by the toilet and in the tub and shower. Do not use towel bars as grab bars. Use non-skid mats or decals in the tub or shower. If you need to sit down in the shower, use a plastic, non-slip stool. Keep the floor  dry. Clean up any water that spills on the floor as soon as it happens. Remove soap buildup in the tub or shower regularly. Attach bath mats securely with double-sided non-slip rug tape. Do not have throw rugs and other things on the floor that can make you trip. What can I do in the bedroom? Use night lights. Make sure that you have a light by your bed that is easy to reach. Do not use any sheets or blankets that are too big for your bed. They should not hang down onto the floor. Have a firm chair that has side arms. You can use this for support while you get dressed. Do not have throw rugs and other things on the floor that can make you trip. What can I do in the kitchen? Clean up any spills right away. Avoid walking on wet floors. Keep items that you use a lot in easy-to-reach places. If you need to reach something above you, use a strong step stool that has a grab bar. Keep electrical cords out of the way. Do not use floor polish or wax that makes floors slippery.  If you must use wax, use non-skid floor wax. Do not have throw rugs and other things on the floor that can make you trip. What can I do with my stairs? Do not leave any items on the stairs. Make sure that there are handrails on both sides of the stairs and use them. Fix handrails that are broken or loose. Make sure that handrails are as long as the stairways. Check any carpeting to make sure that it is firmly attached to the stairs. Fix any carpet that is loose or worn. Avoid having throw rugs at the top or bottom of the stairs. If you do have throw rugs, attach them to the floor with carpet tape. Make sure that you have a light switch at the top of the stairs and the bottom of the stairs. If you do not have them, ask someone to add them for you. What else can I do to help prevent falls? Wear shoes that: Do not have high heels. Have rubber bottoms. Are comfortable and fit you well. Are closed at the toe. Do not wear  sandals. If you use a stepladder: Make sure that it is fully opened. Do not climb a closed stepladder. Make sure that both sides of the stepladder are locked into place. Ask someone to hold it for you, if possible. Clearly Shellie and make sure that you can see: Any grab bars or handrails. First and last steps. Where the edge of each step is. Use tools that help you move around (mobility aids) if they are needed. These include: Canes. Walkers. Scooters. Crutches. Turn on the lights when you go into a dark area. Replace any light bulbs as soon as they burn out. Set up your furniture so you have a clear path. Avoid moving your furniture around. If any of your floors are uneven, fix them. If there are any pets around you, be aware of where they are. Review your medicines with your doctor. Some medicines can make you feel dizzy. This can increase your chance of falling. Ask your doctor what other things that you can do to help prevent falls. This information is not intended to replace advice given to you by your health care provider. Make sure you discuss any questions you have with your health care provider. Document Released: 04/03/2009 Document Revised: 11/13/2015 Document Reviewed: 07/12/2014 Elsevier Interactive Patient Education  2017 [employer].

## 2022-03-11 NOTE — Progress Notes (Signed)
Subjective:   Nicole Douglas is a 76 y.o. female who presents for Medicare Annual (Subsequent) preventive examination.  I connected with  Lillia Pauls on 03/11/22 by a audio enabled telemedicine application and verified that I am speaking with the correct person using two identifiers.  Patient Location: Home  Provider Location: Office/Clinic  I discussed the limitations of evaluation and management by telemedicine. The patient expressed understanding and agreed to proceed.   Review of Systems    Defer to PCP Cardiac Risk Factors include: advanced age (>68men, >44 women);diabetes mellitus;hypertension     Objective:    There were no vitals filed for this visit. There is no height or weight on file to calculate BMI.     03/11/2022    9:03 AM 03/09/2021    9:47 AM 11/20/2018   11:06 AM 12/28/2016    5:03 PM  Advanced Directives  Does Patient Have a Medical Advance Directive? No No No No  Would patient like information on creating a medical advance directive? No - Patient declined Yes (MAU/Ambulatory/Procedural Areas - Information given) No - Patient declined     Current Medications (verified) Outpatient Encounter Medications as of 03/11/2022  Medication Sig   amLODipine (NORVASC) 10 MG tablet TAKE 1 TABLET BY MOUTH  DAILY   aspirin 81 MG tablet Take 81 mg by mouth daily.   atorvastatin (LIPITOR) 10 MG tablet TAKE 1 TABLET BY MOUTH DAILY   Calcium Carbonate (CALTRATE 600 PO) Take by mouth daily.   COVID-19 mRNA bivalent vaccine, Pfizer, (PFIZER COVID-19 VAC BIVALENT) injection Inject into the muscle.   estradiol (ESTRACE) 0.1 MG/GM vaginal cream Place vaginally.   famotidine (PEPCID) 20 MG tablet Take 1 tablet (20 mg total) by mouth 2 (two) times daily.   levothyroxine (SYNTHROID) 50 MCG tablet TAKE 1 TABLET BY MOUTH ONCE  DAILY EXCEPT ONE-HALF TABLET BY  MOUTH ON SUNDAYS   losartan (COZAAR) 50 MG tablet Take 2 tablets (100 mg total) by mouth daily.   meclizine (ANTIVERT) 25 MG  tablet Take 1 tablet (25 mg total) by mouth 3 (three) times daily as needed for dizziness.   metFORMIN (GLUCOPHAGE) 500 MG tablet Take 1 tablet (500 mg total) by mouth 2 (two) times daily with a meal.   metoprolol tartrate (LOPRESSOR) 25 MG tablet Take 1 tablet (25 mg total) by mouth 2 (two) times daily.   sitaGLIPtin (JANUVIA) 100 MG tablet Take 1 tablet (100 mg total) by mouth daily.   No facility-administered encounter medications on file as of 03/11/2022.    Allergies (verified) Patient has no known allergies.   History: Past Medical History:  Diagnosis Date   Hyperglycemia    Hyperlipemia    Hypertension    Thyroid disease    Past Surgical History:  Procedure Laterality Date   ABDOMINAL HYSTERECTOMY     BACK SURGERY     BIOPSY BREAST     left breast   BREAST BIOPSY Left    benign    ROTATOR CUFF REPAIR     bilateral   Family History  Problem Relation Age of Onset   Hypertension Other        both sides of family   Heart disease Other    Cancer Brother        throat   Heart attack Mother        died at 69   Heart attack Sister        age 66   Prostate cancer Father  Social History   Socioeconomic History   Marital status: Married    Spouse name: Not on file   Number of children: Not on file   Years of education: Not on file   Highest education level: Not on file  Occupational History   Not on file  Tobacco Use   Smoking status: Never   Smokeless tobacco: Never  Substance and Sexual Activity   Alcohol use: No   Drug use: No   Sexual activity: Not on file  Other Topics Concern   Not on file  Social History Narrative   Retired from [employer]   3 grown children (oldest daughter in [place], Daughter local, Son deceased was murdered)   Married   Enjoys reading, crossword, adult coloring, sewing    No pets   Social Determinants of Health   Financial Resource Strain: Low Risk  (03/09/2021)   Overall Financial Resource Strain (CARDIA)    Difficulty of  Paying Living Expenses: Not hard at all  Food Insecurity: No Food Insecurity (03/09/2021)   Hunger Vital Sign    Worried About Running Out of Food in the Last Year: Never true    Ran Out of Food in the Last Year: Never true  Transportation Needs: No Transportation Needs (03/09/2021)   PRAPARE - Administrator, Civil Service (Medical): No    Lack of Transportation (Non-Medical): No  Physical Activity: Sufficiently Active (03/09/2021)   Exercise Vital Sign    Days of Exercise per Week: 3 days    Minutes of Exercise per Session: 60 min  Stress: No Stress Concern Present (03/09/2021)   [employer] of Occupational Health - Occupational Stress Questionnaire    Feeling of Stress : Not at all  Social Connections: Moderately Integrated (03/09/2021)   Social Connection and Isolation Panel [NHANES]    Frequency of Communication with Friends and Family: More than three times a week    Frequency of Social Gatherings with Friends and Family: Once a week    Attends Religious Services: More than 4 times per year    Active Member of [employer] or Organizations: No    Attends Engineer, structural: Never    Marital Status: Married    Tobacco Counseling Counseling given: Not Answered   Clinical Intake:  Pre-visit preparation completed: Yes  Pain : No/denies pain     Diabetes: Yes CBG done?: No Did pt. bring in CBG monitor from home?: No (audio visit)  How often do you need to have someone help you when you read instructions, pamphlets, or other written materials from your doctor or pharmacy?: 1 - Never  Diabetic? Yes Nutrition Risk Assessment:  Has the patient had any N/V/D within the last 2 months?  No  Does the patient have any non-healing wounds?  No  Has the patient had any unintentional weight loss or weight gain?  No   Diabetes:  Is the patient diabetic?  Yes  If diabetic, was a CBG obtained today?  No  Did the patient bring in their glucometer from home?    Audio visit How often do you monitor your CBG's? Once or twice a month.   Financial Strains and Diabetes Management:  Are you having any financial strains with the device, your supplies or your medication? No .  Does the patient want to be seen by Chronic Care Management for management of their diabetes?  No  Would the patient like to be referred to a Nutritionist or for Diabetic Management?  No   Diabetic Exams:  Diabetic Eye Exam: Overdue for diabetic eye exam. Pt has been advised about the importance in completing this exam. Patient advised to call and schedule an eye exam. Diabetic Foot Exam: Completed 03/25/21    Interpreter Needed?: No  Information entered by :: Donne Anon, CMA   Activities of Daily Living    03/11/2022    9:04 AM  In your present state of health, do you have any difficulty performing the following activities:  Hearing? 0  Vision? 0  Difficulty concentrating or making decisions? 0  Walking or climbing stairs? 0  Dressing or bathing? 0  Doing errands, shopping? 0  Preparing Food and eating ? N  Using the Toilet? N  In the past six months, have you accidently leaked urine? Y  Comment only if she holds it for too long  Do you have problems with loss of bowel control? N  Managing your Medications? N  Managing your Finances? N  Housekeeping or managing your Housekeeping? N    Patient Care Team: Sandford Craze, NP as PCP - General (Internal Medicine) Sydnee Cabal., MD as Consulting Physician (Gastroenterology)  Indicate any recent Medical Services you may have received from other than [facility] providers in the past year (date may be approximate).     Assessment:   This is a routine wellness examination for Lizmarie.  Hearing/Vision screen No results found.  Dietary issues and exercise activities discussed: Current Exercise Habits: Home exercise routine, Type of exercise: walking, Time (Minutes): 60, Frequency (Times/Week): 2, Weekly Exercise  (Minutes/Week): 120, Intensity: Mild, Exercise limited by: None identified   Goals Addressed   None    Depression Screen    03/11/2022    9:03 AM 03/09/2021    9:54 AM 03/03/2021    8:34 AM 12/23/2020    8:35 AM 05/12/2020    8:59 AM 11/20/2018   11:07 AM 06/03/2017   11:25 AM  PHQ 2/9 Scores  PHQ - 2 Score 0 0 0 0 0 0 0  PHQ- 9 Score       0    Fall Risk    03/11/2022    9:02 AM 03/09/2021    9:52 AM 03/03/2021    8:34 AM 12/23/2020    8:35 AM 05/12/2020    8:58 AM  Fall Risk   Falls in the past year? 1 1 1  0 0  Number falls in past yr: 0 0 1 0 0  Injury with Fall? 0 1 0 0 0  Risk for fall due to : No Fall Risks History of fall(s)     Follow up Falls evaluation completed Falls prevention discussed       FALL RISK PREVENTION PERTAINING TO THE HOME:  Any stairs in or around the home? No  If so, are there any without handrails?  No stairs Home free of loose throw rugs in walkways, pet beds, electrical cords, etc? Yes  Adequate lighting in your home to reduce risk of falls? Yes   ASSISTIVE DEVICES UTILIZED TO PREVENT FALLS:  Life alert? No  Use of a cane, walker or w/c? No  Grab bars in the bathroom? Yes  Shower chair or bench in shower? No  Elevated toilet seat or a handicapped toilet? No   TIMED UP AND GO:  Was the test performed? No . Audio visit   Cognitive Function:        03/11/2022    9:09 AM  6CIT Screen  What Year? 0  points  What month? 0 points  What time? 0 points  Count back from 20 0 points  Months in reverse 2 points  Repeat phrase 0 points  Total Score 2 points    Immunizations Immunization History  Administered Date(s) Administered   Fluad Quad(high Dose 65+) 03/06/2019, 05/12/2020, 03/03/2021   Influenza, High Dose Seasonal PF 04/13/2016, 06/03/2017, 04/21/2018   Influenza,inj,Quad PF,6+ Mos 03/27/2014, 04/22/2015   PFIZER Comirnaty(Gray Top)Covid-19 Tri-Sucrose Vaccine 12/23/2020   PFIZER(Purple Top)SARS-COV-2 Vaccination 07/27/2019,  08/17/2019, 03/17/2020   Pfizer Covid-19 Vaccine Bivalent Booster 1967yrs & up 12/15/2021   Pneumococcal Conjugate-13 04/02/2015   Pneumococcal Polysaccharide-23 09/20/2013   Tdap 03/27/2008, 09/23/2020    TDAP status: Up to date  Flu Vaccine status: Due, Education has been provided regarding the importance of this vaccine. Advised may receive this vaccine at local pharmacy or Health Dept. Aware to provide a copy of the vaccination record if obtained from local pharmacy or Health Dept. Verbalized acceptance and understanding.  Pneumococcal vaccine status: Up to date  Covid-19 vaccine status: Information provided on how to obtain vaccines.   Qualifies for Shingles Vaccine? Yes   Zostavax completed No   Shingrix Completed?: No.    Education has been provided regarding the importance of this vaccine. Patient has been advised to call insurance company to determine out of pocket expense if they have not yet received this vaccine. Advised may also receive vaccine at local pharmacy or Health Dept. Verbalized acceptance and understanding.  Screening Tests Health Maintenance  Topic Date Due   OPHTHALMOLOGY EXAM  Never done   Hepatitis C Screening  Never done   Zoster Vaccines- Shingrix (1 of 2) Never done   Diabetic kidney evaluation - Urine ACR  08/08/2021   MAMMOGRAM  12/11/2021   INFLUENZA VACCINE  01/19/2022   FOOT EXAM  03/25/2022   COVID-19 Vaccine (6 - Pfizer series) 04/16/2022   HEMOGLOBIN A1C  06/16/2022   DEXA SCAN  12/12/2022   Diabetic kidney evaluation - GFR measurement  12/16/2022   TETANUS/TDAP  09/24/2030   Pneumonia Vaccine 1065+ Years old  Completed   HPV VACCINES  Aged Out   COLONOSCOPY (Pts [id number] Insurance coverage will need to be confirmed)  Discontinued    Health Maintenance  Health Maintenance Due  Topic Date Due   OPHTHALMOLOGY EXAM  Never done   Hepatitis C Screening  Never done   Zoster Vaccines- Shingrix (1 of 2) Never done   Diabetic kidney evaluation -  Urine ACR  08/08/2021   MAMMOGRAM  12/11/2021   INFLUENZA VACCINE  01/19/2022    Colorectal cancer screening: Type of screening: Colonoscopy. Completed 08/20/20. Repeat every N/a years  Mammogram status: Completed 12/11/20. Repeat every year  Bone Density status: Completed 12/11/20. Results reflect: Bone density results: NORMAL. Repeat every 2 years.  Lung Cancer Screening: (Low Dose CT Chest recommended if Age 54-80 years, 30 pack-year currently smoking OR have quit w/in 15years.) does not qualify.   Lung Cancer Screening Referral: N/a  Additional Screening:  Hepatitis C Screening: does qualify; Completed N/a  Vision Screening: Recommended annual ophthalmology exams for early detection of glaucoma and other disorders of the eye. Is the patient up to date with their annual eye exam?  Yes  Who is the provider or what is the name of the office in which the patient attends annual eye exams? Dr. Logan BoresEvans If pt is not established with a provider, would they like to be referred to a provider to establish care? No .  Dental Screening: Recommended annual dental exams for proper oral hygiene  Community Resource Referral / Chronic Care Management: CRR required this visit?  No   CCM required this visit?  No      Plan:     I have personally reviewed and noted the following in the patient's chart:   Medical and social history Use of alcohol, tobacco or illicit drugs  Current medications and supplements including opioid prescriptions. Patient is not currently taking opioid prescriptions. Functional ability and status Nutritional status Physical activity Advanced directives List of other physicians Hospitalizations, surgeries, and ER visits in previous 12 months Vitals Screenings to include cognitive, depression, and falls Referrals and appointments  In addition, I have reviewed and discussed with patient certain preventive protocols, quality metrics, and best practice recommendations.  A written personalized care plan for preventive services as well as general preventive health recommendations were provided to patient.   Due to this being a telephonic visit, the after visit summary with patients personalized plan was offered to patient via mail or my-chart. Patient would like to access on my-chart.  Beatris Ship, [place]   03/11/2022   Nurse Notes: None

## 2022-03-13 ENCOUNTER — Other Ambulatory Visit: Payer: Self-pay | Admitting: Family

## 2022-03-17 ENCOUNTER — Other Ambulatory Visit (HOSPITAL_BASED_OUTPATIENT_CLINIC_OR_DEPARTMENT_OTHER): Payer: Self-pay

## 2022-03-17 ENCOUNTER — Ambulatory Visit (INDEPENDENT_AMBULATORY_CARE_PROVIDER_SITE_OTHER): Payer: Medicare Other | Admitting: Family

## 2022-03-17 ENCOUNTER — Telehealth: Payer: Self-pay | Admitting: Family

## 2022-03-17 VITALS — BP 135/60 | HR 68 | Temp 97.8°F | Resp 16 | Wt 171.0 lb

## 2022-03-17 DIAGNOSIS — K219 Gastro-esophageal reflux disease without esophagitis: Secondary | ICD-10-CM

## 2022-03-17 DIAGNOSIS — U071 COVID-19: Secondary | ICD-10-CM

## 2022-03-17 DIAGNOSIS — E038 Other specified hypothyroidism: Secondary | ICD-10-CM

## 2022-03-17 DIAGNOSIS — I10 Essential (primary) hypertension: Secondary | ICD-10-CM | POA: Diagnosis not present

## 2022-03-17 DIAGNOSIS — E1165 Type 2 diabetes mellitus with hyperglycemia: Secondary | ICD-10-CM

## 2022-03-17 DIAGNOSIS — E785 Hyperlipidemia, unspecified: Secondary | ICD-10-CM

## 2022-03-17 DIAGNOSIS — E119 Type 2 diabetes mellitus without complications: Secondary | ICD-10-CM | POA: Diagnosis not present

## 2022-03-17 LAB — MICROALBUMIN / CREATININE URINE RATIO
Creatinine,U: 51.8 mg/dL
Microalb Creat Ratio: 1.4 mg/g (ref 0.0–30.0)
Microalb, Ur: <0.7 mg/dL (ref 0.0–1.9)

## 2022-03-17 LAB — BASIC METABOLIC PANEL
BUN: 21 mg/dL (ref 6–23)
CO2: 28 mEq/L (ref 19–32)
Calcium: 9.5 mg/dL (ref 8.4–10.5)
Chloride: 100 mEq/L (ref 96–112)
Creatinine, Ser: 0.93 mg/dL (ref 0.40–1.20)
GFR: 59.22 mL/min — ABNORMAL LOW (ref >60.00)
Glucose, Bld: 204 mg/dL — ABNORMAL HIGH (ref 70–99)
Potassium: 4.7 mEq/L (ref 3.5–5.1)
Sodium: 136 mEq/L (ref 135–145)

## 2022-03-17 LAB — HEMOGLOBIN A1C: Hgb A1c MFr Bld: 7.9 % — ABNORMAL HIGH (ref 4.6–6.5)

## 2022-03-17 MED ORDER — OMEPRAZOLE 40 MG PO CPDR
40.0000 mg | DELAYED_RELEASE_CAPSULE | Freq: Every day | ORAL | 0 refills | Status: DC
  Filled 2022-03-17: qty 30, 30d supply, fill #0

## 2022-03-17 MED ORDER — METFORMIN HCL 500 MG PO TABS: 500.0000 mg | ORAL_TABLET | Freq: Two times a day (BID) | ORAL | 1 refills | Status: DC

## 2022-03-17 MED ORDER — LOSARTAN POTASSIUM 50 MG PO TABS: 100.0000 mg | ORAL_TABLET | Freq: Every day | ORAL | 1 refills | Status: DC

## 2022-03-17 MED ORDER — SITAGLIPTIN PHOSPHATE 100 MG PO TABS: 100.0000 mg | ORAL_TABLET | Freq: Every day | ORAL | 3 refills | Status: DC

## 2022-03-17 MED ORDER — METOPROLOL TARTRATE 25 MG PO TABS: 25.0000 mg | ORAL_TABLET | Freq: Two times a day (BID) | ORAL | 1 refills | Status: DC

## 2022-03-17 MED ORDER — SHINGRIX 50 MCG/0.5ML IM SUSR
0.5000 mL | INTRAMUSCULAR | 1 refills | Status: DC
  Filled 2022-03-17: qty 1, 1d supply, fill #0

## 2022-03-17 MED ORDER — OMEPRAZOLE 40 MG PO CPDR: 40.0000 mg | DELAYED_RELEASE_CAPSULE | Freq: Every day | ORAL | 0 refills | Status: DC

## 2022-03-17 NOTE — Telephone Encounter (Signed)
Please call Dr. Amalia Hailey optometry [phone] to request DM eye exam.

## 2022-03-17 NOTE — Assessment & Plan Note (Signed)
Lab Results  Component Value Date   HGBA1C 8.4 (H) 12/15/2021   HGBA1C 7.9 (H) 03/25/2021   HGBA1C 8.8 (H) 12/23/2020   Lab Results  Component Value Date   LDLCALC 101 (H) 12/15/2021   CREATININE 0.91 12/15/2021   Continue januvia and metformin. Obtain follow up A1C. Last A1C was high.

## 2022-03-17 NOTE — Telephone Encounter (Signed)
Records release faxed 

## 2022-03-17 NOTE — Assessment & Plan Note (Signed)
Lab Results  Component Value Date   CHOL 192 12/15/2021   HDL 64.80 12/15/2021   LDLCALC 101 (H) 12/15/2021   TRIG 135.0 12/15/2021   CHOLHDL 3 12/15/2021   Continues atorvastatin, LDL stable. Continue same.

## 2022-03-17 NOTE — Assessment & Plan Note (Signed)
BP Readings from Last 3 Encounters:  03/17/22 135/60  12/15/21 137/72  03/25/21 122/70   BP stable. Continue metoprolol and losartan.

## 2022-03-17 NOTE — Assessment & Plan Note (Signed)
pepcid is not helpful. Stop pepcid start prilosec.

## 2022-03-17 NOTE — Patient Instructions (Signed)
Please get covid booster and shingles shot at your pharmacy.

## 2022-03-17 NOTE — Assessment & Plan Note (Signed)
Lab Results  Component Value Date   TSH 1.41 12/15/2021   Feels well on synthroid. Continue same.

## 2022-03-17 NOTE — Progress Notes (Signed)
Subjective:   By signing my name below, I, Ok Edwards, attest that this documentation has been prepared under the direction and in the presence of Nicole Downs' Suvillivan, NP 03/17/2022     Patient ID: Nicole Douglas, female    DOB: 07-27-1945, 76 y.o.   MRN: [id number]  Chief Complaint  Patient presents with   Hypertension    Here for follow up    Diabetes    Here for follow up   Covid Positive    Tested positive for covid 03-09-22.     HPI Patient is in today for office visit.   Refills:   COVID-19: She tested positive for COVID-19 on 03/07/2022. She was prescribed 200 mg Molnupiravir to help alleviate her symptoms. She is feeling much better now.  Hyperglycemia: Her sugar levels were high during the last visit. She is still taking 500 mg Glucophage and 100 mg Januvia to help manage her sugar. She is maintaining a healthy diet.   Lab Results  Component Value Date   HGBA1C 8.4 (H) 12/15/2021    Heart Burn: She reports that 20 mg Pepcid is not helping her heart burn. She is taking it twice a day.   Hyperlipemia: She is regularly taking 10 mg atorvastatin to help manage her cholesterol.  Lab Results  Component Value Date   CHOL 192 12/15/2021   HDL 64.80 12/15/2021   LDLCALC 101 (H) 12/15/2021   TRIG 135.0 12/15/2021   CHOLHDL 3 12/15/2021     Thyroid Disease: She is regularly taking 50 mg Synthroid to help manage her thyroid and denies of any side effects.  Lab Results  Component Value Date   TSH 1.41 12/15/2021     Vision: She reports that her last eye exam was in April 2023.  Immunization: She is interested in receiving the influenza vaccine during today's visit.    Past Medical History:  Diagnosis Date   Hyperglycemia    Hyperlipemia    Hypertension    Thyroid disease     Past Surgical History:  Procedure Laterality Date   ABDOMINAL HYSTERECTOMY     BACK SURGERY     BIOPSY BREAST     left breast   BREAST BIOPSY Left    benign    ROTATOR CUFF  REPAIR     bilateral    Family History  Problem Relation Age of Onset   Hypertension Other        both sides of family   Heart disease Other    Cancer Brother        throat   Heart attack Mother        died at 68   Heart attack Sister        age 33   Prostate cancer Father     Social History   Socioeconomic History   Marital status: Married    Spouse name: Not on file   Number of children: Not on file   Years of education: Not on file   Highest education level: Not on file  Occupational History   Not on file  Tobacco Use   Smoking status: Never   Smokeless tobacco: Never  Substance and Sexual Activity   Alcohol use: No   Drug use: No   Sexual activity: Not on file  Other Topics Concern   Not on file  Social History Narrative   Retired from [employer]   3 grown children (oldest daughter in [place], Daughter local, Son deceased was murdered)  Married   Enjoys reading, crossword, adult coloring, sewing    No pets   Social Determinants of Health   Financial Resource Strain: Low Risk  (03/09/2021)   Overall Financial Resource Strain (CARDIA)    Difficulty of Paying Living Expenses: Not hard at all  Food Insecurity: No Food Insecurity (03/09/2021)   Hunger Vital Sign    Worried About Running Out of Food in the Last Year: Never true    Ran Out of Food in the Last Year: Never true  Transportation Needs: No Transportation Needs (03/09/2021)   PRAPARE - Administrator, Civil Service (Medical): No    Lack of Transportation (Non-Medical): No  Physical Activity: Sufficiently Active (03/09/2021)   Exercise Vital Sign    Days of Exercise per Week: 3 days    Minutes of Exercise per Session: 60 min  Stress: No Stress Concern Present (03/09/2021)   [employer] of Occupational Health - Occupational Stress Questionnaire    Feeling of Stress : Not at all  Social Connections: Moderately Integrated (03/09/2021)   Social Connection and Isolation Panel [NHANES]     Frequency of Communication with Friends and Family: More than three times a week    Frequency of Social Gatherings with Friends and Family: Once a week    Attends Religious Services: More than 4 times per year    Active Member of [employer] or Organizations: No    Attends Banker Meetings: Never    Marital Status: Married  Catering manager Violence: Not At Risk (03/09/2021)   Humiliation, Afraid, Rape, and Kick questionnaire    Fear of Current or Ex-Partner: No    Emotionally Abused: No    Physically Abused: No    Sexually Abused: No    Outpatient Medications Prior to Visit  Medication Sig Dispense Refill   amLODipine (NORVASC) 10 MG tablet TAKE 1 TABLET BY MOUTH DAILY 90 tablet 1   aspirin 81 MG tablet Take 81 mg by mouth daily.     atorvastatin (LIPITOR) 10 MG tablet TAKE 1 TABLET BY MOUTH DAILY 100 tablet 2   Calcium Carbonate (CALTRATE 600 PO) Take by mouth daily.     estradiol (ESTRACE) 0.1 MG/GM vaginal cream Place vaginally.     levothyroxine (SYNTHROID) 50 MCG tablet TAKE 1 TABLET BY MOUTH ONCE  DAILY EXCEPT ONE-HALF TABLET BY  MOUTH ON SUNDAYS 94 tablet 2   meclizine (ANTIVERT) 25 MG tablet Take 1 tablet (25 mg total) by mouth 3 (three) times daily as needed for dizziness. 30 tablet 0   COVID-19 mRNA bivalent vaccine, Pfizer, (PFIZER COVID-19 VAC BIVALENT) injection Inject into the muscle. 0.3 mL 0   famotidine (PEPCID) 20 MG tablet Take 1 tablet (20 mg total) by mouth 2 (two) times daily. 180 tablet 1   losartan (COZAAR) 50 MG tablet Take 2 tablets (100 mg total) by mouth daily. 180 tablet 0   metFORMIN (GLUCOPHAGE) 500 MG tablet Take 1 tablet (500 mg total) by mouth 2 (two) times daily with a meal. 180 tablet 0   metoprolol tartrate (LOPRESSOR) 25 MG tablet Take 1 tablet (25 mg total) by mouth 2 (two) times daily. 180 tablet 0   sitaGLIPtin (JANUVIA) 100 MG tablet Take 1 tablet (100 mg total) by mouth daily. 30 tablet 3   benzonatate (TESSALON) 100 MG capsule Take 1  capsule (100 mg total) by mouth 3 (three) times daily as needed. (Patient not taking: Reported on 03/17/2022) 30 capsule 0   No facility-administered medications prior  to visit.    No Known Allergies  Review of Systems  Musculoskeletal:        (+) Knot near her left neck.       Objective:    Physical Exam Constitutional:      General: She is not in acute distress.    Appearance: Normal appearance. She is not ill-appearing.  HENT:     Head: Normocephalic and atraumatic.     Right Ear: External ear normal.     Left Ear: External ear normal.  Eyes:     Extraocular Movements: Extraocular movements intact.     Pupils: Pupils are equal, round, and reactive to light.  Cardiovascular:     Rate and Rhythm: Normal rate and regular rhythm.     Heart sounds: Normal heart sounds. No murmur heard.    No gallop.  Pulmonary:     Effort: Pulmonary effort is normal. No respiratory distress.     Breath sounds: Normal breath sounds. No wheezing or rales.  Lymphadenopathy:     Cervical: No cervical adenopathy.  Skin:    General: Skin is warm and dry.  Neurological:     Mental Status: She is alert and oriented to person, place, and time.  Psychiatric:        Judgment: Judgment normal.     BP 135/60   Pulse 68   Temp 97.8 F (36.6 C) (Oral)   Resp 16   Wt 171 lb (77.6 kg)   SpO2 100%   BMI 27.60 kg/m  Wt Readings from Last 3 Encounters:  03/17/22 171 lb (77.6 kg)  12/15/21 173 lb (78.5 kg)  03/25/21 173 lb 9.6 oz (78.7 kg)      Assessment & Plan:   Problem List Items Addressed This Visit       Unprioritized   Hypothyroidism    Lab Results  Component Value Date   TSH 1.41 12/15/2021  Feels well on synthroid. Continue same.       Relevant Medications   metoprolol tartrate (LOPRESSOR) 25 MG tablet   Hyperlipidemia    Lab Results  Component Value Date   CHOL 192 12/15/2021   HDL 64.80 12/15/2021   LDLCALC 101 (H) 12/15/2021   TRIG 135.0 12/15/2021   CHOLHDL 3  12/15/2021  Continues atorvastatin, LDL stable. Continue same.       Relevant Medications   metoprolol tartrate (LOPRESSOR) 25 MG tablet   losartan (COZAAR) 50 MG tablet   HTN (hypertension)    BP Readings from Last 3 Encounters:  03/17/22 135/60  12/15/21 137/72  03/25/21 122/70  BP stable. Continue metoprolol and losartan.       Relevant Medications   metoprolol tartrate (LOPRESSOR) 25 MG tablet   losartan (COZAAR) 50 MG tablet   GERD (gastroesophageal reflux disease)    pepcid is not helpful. Stop pepcid start prilosec.       Relevant Medications   omeprazole (PRILOSEC) 40 MG capsule   Controlled type 2 diabetes mellitus without complication, without long-term current use of insulin (HCC)    Lab Results  Component Value Date   HGBA1C 8.4 (H) 12/15/2021   HGBA1C 7.9 (H) 03/25/2021   HGBA1C 8.8 (H) 12/23/2020   Lab Results  Component Value Date   LDLCALC 101 (H) 12/15/2021   CREATININE 0.91 12/15/2021  Continue januvia and metformin. Obtain follow up A1C. Last A1C was high.       Relevant Medications   sitaGLIPtin (JANUVIA) 100 MG tablet   metFORMIN (GLUCOPHAGE) 500 MG  tablet   losartan (COZAAR) 50 MG tablet   Other Relevant Orders   Urine Microalbumin w/creat. ratio   MM 3D SCREEN BREAST BILATERAL   Basic metabolic panel   Other Visit Diagnoses     Uncontrolled diabetes mellitus with hyperglycemia, without long-term current use of insulin (HCC)    -  Primary   Relevant Medications   sitaGLIPtin (JANUVIA) 100 MG tablet   metFORMIN (GLUCOPHAGE) 500 MG tablet   losartan (COZAAR) 50 MG tablet   Other Relevant Orders   Hemoglobin A1c   COVID-19       Relevant Medications   Zoster Vaccine Adjuvanted [facility]) injection        Meds ordered this encounter  Medications   DISCONTD: omeprazole (PRILOSEC) 40 MG capsule    Sig: Take 1 capsule (40 mg total) by mouth daily.    Dispense:  30 capsule    Refill:  0    Order Specific Question:   Supervising  Provider    Answer:   Danise Edge A [[id number]]   omeprazole (PRILOSEC) 40 MG capsule    Sig: Take 1 capsule (40 mg total) by mouth daily.    Dispense:  90 capsule    Refill:  0    Order Specific Question:   Supervising Provider    Answer:   Danise Edge A [[id number]]   Zoster Vaccine Adjuvanted [facility]) injection    Sig: Inject 0.5mg  IM now and again in 2-6 months.    Dispense:  0.5 mL    Refill:  1    Order Specific Question:   Supervising Provider    Answer:   Danise Edge A [[id number]]   sitaGLIPtin (JANUVIA) 100 MG tablet    Sig: Take 1 tablet (100 mg total) by mouth daily.    Dispense:  30 tablet    Refill:  3    Order Specific Question:   Supervising Provider    Answer:   Danise Edge A [[id number]]   metoprolol tartrate (LOPRESSOR) 25 MG tablet    Sig: Take 1 tablet (25 mg total) by mouth 2 (two) times daily.    Dispense:  180 tablet    Refill:  1    Order Specific Question:   Supervising Provider    Answer:   Danise Edge A [[id number]]   metFORMIN (GLUCOPHAGE) 500 MG tablet    Sig: Take 1 tablet (500 mg total) by mouth 2 (two) times daily with a meal.    Dispense:  180 tablet    Refill:  1    Order Specific Question:   Supervising Provider    Answer:   Danise Edge A [[id number]]   losartan (COZAAR) 50 MG tablet    Sig: Take 2 tablets (100 mg total) by mouth daily.    Dispense:  180 tablet    Refill:  1    Order Specific Question:   Supervising Provider    Answer:   Danise Edge A [[id number]]    I, Lemont Fillers, NP, personally preformed the services described in this documentation.  All medical record entries made by the scribe were at my direction and in my presence.  I have reviewed the chart and discharge instructions (if applicable) and agree that the record reflects my personal performance and is accurate and complete. 03/17/2022.  Johann Capers O'Sullivan,acting as a Neurosurgeon for Lemont Fillers, NP.,have documented all relevant documentation on the behalf of Lemont Fillers, NP,as directed by  Lemont Fillers, NP while in the  presence of Nicole Pear, NP.   Nicole Pear, NP

## 2022-03-18 ENCOUNTER — Other Ambulatory Visit: Payer: Self-pay | Admitting: Family

## 2022-03-18 DIAGNOSIS — N8111 Cystocele, midline: Secondary | ICD-10-CM | POA: Diagnosis not present

## 2022-03-18 DIAGNOSIS — N816 Rectocele: Secondary | ICD-10-CM | POA: Diagnosis not present

## 2022-03-18 NOTE — Telephone Encounter (Signed)
A1C is above goal. I would like for her to increase her metformin from 500mg  to 1000mg  BID. Continue work on diet/exercise/weight loss.   I have pended 1000mg  tabs to go to [facility]. Please confirm with pt that this is where she would like it sent. tks

## 2022-03-23 ENCOUNTER — Ambulatory Visit (HOSPITAL_BASED_OUTPATIENT_CLINIC_OR_DEPARTMENT_OTHER)
Admission: RE | Admit: 2022-03-23 | Discharge: 2022-03-23 | Disposition: A | Discharge status: Home / Self Care | Payer: Medicare Other | Source: Ambulatory Visit | Attending: Family | Admitting: Family

## 2022-03-23 ENCOUNTER — Encounter (HOSPITAL_BASED_OUTPATIENT_CLINIC_OR_DEPARTMENT_OTHER): Payer: Self-pay

## 2022-03-23 DIAGNOSIS — E119 Type 2 diabetes mellitus without complications: Secondary | ICD-10-CM | POA: Insufficient documentation

## 2022-03-23 DIAGNOSIS — Z1231 Encounter for screening mammogram for malignant neoplasm of breast: Secondary | ICD-10-CM | POA: Diagnosis not present

## 2022-03-25 MED ORDER — METFORMIN HCL 1000 MG PO TABS: 1000.0000 mg | ORAL_TABLET | Freq: Two times a day (BID) | ORAL | 1 refills | Status: DC

## 2022-03-25 NOTE — Telephone Encounter (Signed)
I have called the patient and relayed the results. She stated that she just got the 500mg  tabs so I have informed her to double up to 1000mg  BID until she gets the 1000 mg tabs in the mail. She stated understanding and will start that tomorrow.

## 2022-04-02 ENCOUNTER — Other Ambulatory Visit: Payer: Self-pay | Admitting: Family

## 2022-04-13 ENCOUNTER — Other Ambulatory Visit: Payer: Self-pay | Admitting: Family

## 2022-04-29 DIAGNOSIS — N8111 Cystocele, midline: Secondary | ICD-10-CM | POA: Diagnosis not present

## 2022-04-29 DIAGNOSIS — N816 Rectocele: Secondary | ICD-10-CM | POA: Diagnosis not present

## 2022-05-18 ENCOUNTER — Other Ambulatory Visit: Payer: Self-pay | Admitting: Family

## 2022-05-18 DIAGNOSIS — K219 Gastro-esophageal reflux disease without esophagitis: Secondary | ICD-10-CM

## 2022-05-22 ENCOUNTER — Other Ambulatory Visit: Payer: Self-pay | Admitting: Family

## 2022-06-02 ENCOUNTER — Other Ambulatory Visit: Payer: Self-pay | Admitting: Family

## 2022-06-04 ENCOUNTER — Ambulatory Visit: Payer: Medicare Other | Admitting: Obstetrics and Gynecology

## 2022-06-15 ENCOUNTER — Telehealth: Payer: Self-pay

## 2022-06-15 MED ORDER — SITAGLIPTIN PHOSPHATE 100 MG PO TABS
100.0000 mg | ORAL_TABLET | Freq: Every day | ORAL | 3 refills | Status: DC
  Filled 2022-06-15 – 2022-06-22 (×2): qty 30, 30d supply, fill #0
  Filled 2022-07-19: qty 30, 30d supply, fill #1
  Filled 2022-11-16: qty 30, 30d supply, fill #2

## 2022-06-15 NOTE — Addendum Note (Signed)
Addended by: Sandford Craze on: 06/15/2022 10:10 PM   Modules accepted: Orders

## 2022-06-15 NOTE — Telephone Encounter (Signed)
Patient Name: Nicole Douglas Gender: Female DOB: [birth year] Age: 76 Y 10 M 28 Return Phone Number: [phone] Client [facility] Night - Client Client Site [facility] - Night Provider Sandford Craze - NP Contact Type Call Who Is Calling Patient / Member / Family / Caregiver Call Type Triage / Clinical Relationship To Patient Self Return Phone Number [phone] (Primary) Chief Complaint Medication Question (non symptomatic) Reason for Call Medication Question / Request Initial Comment Caller states pharmacy was charge twice as much for her medication due to her going over the medicare limit. Caller states she only has 2 pills left (no symptoms)and wants to know if it's okay if she stops taking medication until January Translation No Disp. Time Lamount Cohen Time) Disposition Final User 06/13/2022 1:02:40 AM Send To Nurse Ferndale Sink, RN, April 06/13/2022 1:54:50 AM Send To RN Personal Roxan Hockey, RN, Imari 06/13/2022 2:07:05 AM FINAL ATTEMPT MADE - message left Yes Franne Grip 06/13/2022 2:07:13 AM Send to RN Final Attempt Lucille Passy, RN, Verlon Au Final Disposition 06/13/2022 2:07:05 AM FINAL ATTEMPT MADE - message left Yes Merilynn Finland, RN, Bobbye Morton

## 2022-06-16 ENCOUNTER — Other Ambulatory Visit: Payer: Self-pay

## 2022-06-16 ENCOUNTER — Other Ambulatory Visit (HOSPITAL_BASED_OUTPATIENT_CLINIC_OR_DEPARTMENT_OTHER): Payer: Self-pay

## 2022-06-22 ENCOUNTER — Telehealth: Payer: Self-pay | Admitting: Family

## 2022-06-22 ENCOUNTER — Other Ambulatory Visit: Payer: Self-pay | Admitting: Family

## 2022-06-22 ENCOUNTER — Other Ambulatory Visit (HOSPITAL_BASED_OUTPATIENT_CLINIC_OR_DEPARTMENT_OTHER): Payer: Self-pay

## 2022-06-22 ENCOUNTER — Ambulatory Visit (INDEPENDENT_AMBULATORY_CARE_PROVIDER_SITE_OTHER): Payer: Medicare Other | Admitting: Family

## 2022-06-22 VITALS — BP 155/82 | HR 78 | Temp 97.8°F | Resp 16 | Wt 170.0 lb

## 2022-06-22 DIAGNOSIS — Z1159 Encounter for screening for other viral diseases: Secondary | ICD-10-CM | POA: Diagnosis not present

## 2022-06-22 DIAGNOSIS — E785 Hyperlipidemia, unspecified: Secondary | ICD-10-CM | POA: Diagnosis not present

## 2022-06-22 DIAGNOSIS — R58 Hemorrhage, not elsewhere classified: Secondary | ICD-10-CM | POA: Diagnosis not present

## 2022-06-22 DIAGNOSIS — I10 Essential (primary) hypertension: Secondary | ICD-10-CM

## 2022-06-22 DIAGNOSIS — K219 Gastro-esophageal reflux disease without esophagitis: Secondary | ICD-10-CM

## 2022-06-22 DIAGNOSIS — Z23 Encounter for immunization: Secondary | ICD-10-CM | POA: Diagnosis not present

## 2022-06-22 DIAGNOSIS — E119 Type 2 diabetes mellitus without complications: Secondary | ICD-10-CM | POA: Diagnosis not present

## 2022-06-22 LAB — CBC WITH DIFFERENTIAL/PLATELET
Basophils Absolute: 0 10*3/uL (ref 0.0–0.1)
Basophils Relative: 0.5 % (ref 0.0–3.0)
Eosinophils Absolute: 0.1 10*3/uL (ref 0.0–0.7)
Eosinophils Relative: 1 % (ref 0.0–5.0)
HCT: 37.5 % (ref 36.0–46.0)
Hemoglobin: 13.1 g/dL (ref 12.0–15.0)
Lymphocytes Relative: 21.1 % (ref 12.0–46.0)
Lymphs Abs: 1.1 10*3/uL (ref 0.7–4.0)
MCHC: 34.9 g/dL (ref 30.0–36.0)
MCV: 87.4 fl (ref 78.0–100.0)
Monocytes Absolute: 0.6 10*3/uL (ref 0.1–1.0)
Monocytes Relative: 10.6 % (ref 3.0–12.0)
Neutro Abs: 3.6 10*3/uL (ref 1.4–7.7)
Neutrophils Relative %: 66.8 % (ref 43.0–77.0)
Platelets: 178 10*3/uL (ref 150.0–400.0)
RBC: 4.29 Mil/uL (ref 3.87–5.11)
RDW: 16.5 % — ABNORMAL HIGH (ref 11.5–15.5)
WBC: 5.3 10*3/uL (ref 4.0–10.5)

## 2022-06-22 LAB — BASIC METABOLIC PANEL
BUN: 21 mg/dL (ref 6–23)
CO2: 29 mEq/L (ref 19–32)
Calcium: 9.8 mg/dL (ref 8.4–10.5)
Chloride: 102 mEq/L (ref 96–112)
Creatinine, Ser: 0.98 mg/dL (ref 0.40–1.20)
GFR: 55.51 mL/min — ABNORMAL LOW (ref >60.00)
Glucose, Bld: 150 mg/dL — ABNORMAL HIGH (ref 70–99)
Potassium: 4.5 mEq/L (ref 3.5–5.1)
Sodium: 142 mEq/L (ref 135–145)

## 2022-06-22 LAB — HEMOGLOBIN A1C: Hgb A1c MFr Bld: 7.3 % — ABNORMAL HIGH (ref 4.6–6.5)

## 2022-06-22 MED ORDER — CARVEDILOL 12.5 MG PO TABS: 12.5000 mg | ORAL_TABLET | Freq: Two times a day (BID) | ORAL | 0 refills | Status: DC

## 2022-06-22 MED ORDER — ACCU-CHEK SOFTCLIX LANCETS MISC
0 refills | Status: DC
  Filled 2022-06-22: qty 100, 25d supply, fill #0

## 2022-06-22 MED ORDER — ACCU-CHEK GUIDE VI STRP
ORAL_STRIP | 0 refills | Status: DC
  Filled 2022-06-22: qty 100, 25d supply, fill #0

## 2022-06-22 MED ORDER — BLOOD GLUCOSE MONITOR KIT: PACK | 0 refills | Status: DC

## 2022-06-22 MED ORDER — ACCU-CHEK GUIDE W/DEVICE KIT
PACK | 0 refills | Status: AC
  Filled 2022-06-22: qty 1, 90d supply, fill #0

## 2022-06-22 NOTE — Assessment & Plan Note (Signed)
Improved on omeprazole in place of pepcid.  Continue same.

## 2022-06-22 NOTE — Assessment & Plan Note (Addendum)
BP Readings from Last 3 Encounters:  06/22/22 (!) 155/82  03/17/22 135/60  12/15/21 137/72   Maintained on losartan, amlodipine and metoprolol.  Uncontrolled.  Will d/c metoprolol and begin carvedilol instead.

## 2022-06-22 NOTE — Assessment & Plan Note (Signed)
Lab Results  Component Value Date   HGBA1C 7.9 (H) 03/17/2022   HGBA1C 8.4 (H) 12/15/2021   HGBA1C 7.9 (H) 03/25/2021   Lab Results  Component Value Date   MICROALBUR <0.7 03/17/2022   LDLCALC 101 (H) 12/15/2021   CREATININE 0.93 03/17/2022   Will obtain follow up A1C.  Maintained on januvia and metformin.

## 2022-06-22 NOTE — Assessment & Plan Note (Signed)
Lab Results  Component Value Date   CHOL 192 12/15/2021   HDL 64.80 12/15/2021   LDLCALC 101 (H) 12/15/2021   TRIG 135.0 12/15/2021   CHOLHDL 3 12/15/2021   Maintained on atorvastatin. Continue current dose.

## 2022-06-22 NOTE — Patient Instructions (Addendum)
Please check sugar once daily and as needed if you are not feeling well. Goal blood sugar is 80-110 before breakfast and <140 after meals.   Stop metoprolol, start carvedilol.

## 2022-06-22 NOTE — Progress Notes (Addendum)
Subjective:   By signing my name below, I, Nicole Douglas, attest that this documentation has been prepared under the direction and in the presence of Nicole Douglas. 06/22/2021      Patient ID: Nicole Douglas, female    DOB: [birth year], 77 y.o.   MRN: [id number]  Chief Complaint  Patient presents with   Hypertension    Here for follow up   Diabetes    Here for follow up   Bleeding/Bruising    Patient complains of bruising on arms and legs    Patient is in today for a follow up visit.   Bruising: She complains of bruising easily while taking 81 mg aspirin.   Blood pressure: Her blood pressure is elevated during this visit. She continues taking 10 mg amlodipine daily PO, 50 mg losartan daily PO, 25 mg metoprolol tartrate daily PO and reports no new issues while taking them.  BP Readings from Last 3 Encounters:  06/22/22 (!) 155/82  03/17/22 135/60  12/15/21 137/72   Pulse Readings from Last 3 Encounters:  06/22/22 78  03/17/22 68  12/15/21 73   Blood sugar: She has no checked her blood sugars at home due to not have a measuring device at home. She is interested in receiving a machine from her pharmacy so she can start measuring regularly. She continues taking 100 mg januvia, 1000 mg metformin and reports no new issues while taking them.  Lab Results  Component Value Date   HGBA1C 7.9 (H) 03/17/2022   Bladder procedure: She is following up with another provider regarding her bladder procedure.   Reflux: She continues taking 40 mg omeprazole daily PO and reports no new issues while taking it.   Immunizations: She is interested in receiving the flu vaccine during this visit. She is interested in receiving hepatitis C screening during her next blood work. She has received the first dosage of her shingrix vaccine at her pharmacy.    Health Maintenance Due  Topic Date Due   Hepatitis C Screening  Never done   Zoster Vaccines- Shingrix (1 of 2) Never done   INFLUENZA  VACCINE  01/19/2022   COVID-19 Vaccine (6 - 2023-24 season) 02/19/2022    Past Medical History:  Diagnosis Date   Hyperglycemia    Hyperlipemia    Hypertension    Thyroid disease     Past Surgical History:  Procedure Laterality Date   ABDOMINAL HYSTERECTOMY     BACK SURGERY     BIOPSY BREAST     left breast   BREAST BIOPSY Left    benign    ROTATOR CUFF REPAIR     bilateral    Family History  Problem Relation Age of Onset   Hypertension Other        both sides of family   Heart disease Other    Cancer Brother        throat   Heart attack Mother        died at 43   Heart attack Sister        age 31   Prostate cancer Father     Social History   Socioeconomic History   Marital status: Married    Spouse name: Not on file   Number of children: Not on file   Years of education: Not on file   Highest education level: Not on file  Occupational History   Not on file  Tobacco Use   Smoking status: Never   Smokeless  tobacco: Never  Substance and Sexual Activity   Alcohol use: No   Drug use: No   Sexual activity: Not on file  Other Topics Concern   Not on file  Social History Narrative   Retired from [employer]   3 grown children (oldest daughter in [place], Daughter local, Son deceased was murdered)   Married   Enjoys reading, crossword, adult coloring, sewing    No pets   Social Determinants of Health   Financial Resource Strain: [place]  (03/09/2021)   Overall Financial Resource Strain (CARDIA)    Difficulty of Paying Living Expenses: Not hard at all  Food Insecurity: No [place] (03/09/2021)   Hunger Vital Sign    Worried About Running Out of Food in the Last Year: Never true    [place] in the Last Year: Never true  Transportation Needs: No Transportation Needs (03/09/2021)   PRAPARE - Hydrologist (Medical): No    Lack of Transportation (Non-Medical): No  Physical Activity: Sufficiently Active (03/09/2021)    Exercise Vital Sign    Days of Exercise per Week: 3 days    Minutes of Exercise per Session: 60 min  Stress: No Stress Concern Present (03/09/2021)   [place]    Feeling of Stress : Not at all  Social Connections: Moderately Integrated (03/09/2021)   Social Connection and Isolation Panel [NHANES]    Frequency of Communication with Friends and Family: More than three times a week    Frequency of Social Gatherings with Friends and Family: Once a week    Attends Religious Services: More than 4 times per year    Active Member of [employer] or Organizations: No    Attends Archivist Meetings: Never    Marital Status: Married  Human resources officer Violence: Not At Risk (03/09/2021)   Humiliation, Afraid, Rape, and Kick questionnaire    Fear of Current or Ex-Partner: No    Emotionally Abused: No    Physically Abused: No    Sexually Abused: No    Outpatient Medications Prior to Visit  Medication Sig Dispense Refill   amLODipine (NORVASC) 10 MG tablet TAKE 1 TABLET BY MOUTH DAILY 100 tablet 2   aspirin 81 MG tablet Take 81 mg by mouth daily.     atorvastatin (LIPITOR) 10 MG tablet TAKE 1 TABLET BY MOUTH DAILY 100 tablet 2   Calcium Carbonate (CALTRATE 600 PO) Take by mouth daily.     estradiol (ESTRACE) 0.1 MG/GM vaginal cream Place vaginally.     levothyroxine (SYNTHROID) 50 MCG tablet TAKE 1 TABLET BY MOUTH ONCE  DAILY EXCEPT ONE-HALF TABLET BY  MOUTH ON SUNDAYS 94 tablet 2   losartan (COZAAR) 50 MG tablet Take 2 tablets (100 mg total) by mouth daily. 180 tablet 1   meclizine (ANTIVERT) 25 MG tablet Take 1 tablet (25 mg total) by mouth 3 (three) times daily as needed for dizziness. 30 tablet 0   metFORMIN (GLUCOPHAGE) 1000 MG tablet TAKE 1 TABLET BY MOUTH TWICE  DAILY WITH A MEAL 200 tablet 1   omeprazole (PRILOSEC) 40 MG capsule TAKE 1 CAPSULE BY MOUTH DAILY 90 capsule 3   sitaGLIPtin (JANUVIA) 100 MG tablet Take 1 tablet  (100 mg total) by mouth daily. 30 tablet 3   Zoster Vaccine Adjuvanted [facility]) injection Inject 0.5 mLs into the muscle now and then again in 2-6 months. 1 each 1   metoprolol tartrate (LOPRESSOR)  25 MG tablet Take 1 tablet (25 mg total) by mouth 2 (two) times daily. 180 tablet 1   No facility-administered medications prior to visit.    No Known Allergies  Review of Systems  Endo/Heme/Allergies:  Bruises/bleeds easily.       Objective:    Physical Exam Constitutional:      General: She is not in acute distress.    Appearance: Normal appearance. She is not ill-appearing.  HENT:     Head: Normocephalic and atraumatic.     Right Ear: External ear normal.     Left Ear: External ear normal.  Eyes:     Extraocular Movements: Extraocular movements intact.     Pupils: Pupils are equal, round, and reactive to light.  Cardiovascular:     Rate and Rhythm: Normal rate and regular rhythm.     Pulses:          Dorsalis pedis pulses are 2+ on the right side and 2+ on the left side.       Posterior tibial pulses are 2+ on the right side and 2+ on the left side.     Heart sounds: Murmur heard.     Systolic murmur is present with a grade of 1/6.     No gallop.     Comments: Blood pressure measured 155/85 during manual recheck Pulmonary:     Effort: Pulmonary effort is normal. No respiratory distress.     Breath sounds: Normal breath sounds. No wheezing or rales.  Skin:    General: Skin is warm and dry.     Findings: Bruising (left forearm) present.  Neurological:     Mental Status: She is alert and oriented to person, place, and time.  Psychiatric:        Judgment: Judgment normal.     BP (!) 155/82   Pulse 78   Temp 97.8 F (36.6 C) (Oral)   Resp 16   Wt 170 lb (77.1 kg)   SpO2 100%   BMI 27.44 kg/m  Wt Readings from Last 3 Encounters:  06/22/22 170 lb (77.1 kg)  03/17/22 171 lb (77.6 kg)  12/15/21 173 lb (78.5 kg)   Diabetic Foot Exam - Simple   Simple Foot  Form Diabetic Foot exam was performed with the following findings: Yes 06/22/2022  8:30 AM  Visual Inspection No deformities, no ulcerations, no other skin breakdown bilaterally: Yes Sensation Testing See comments: Yes Pulse Check Posterior Tibialis and Dorsalis pulse intact bilaterally: Yes Comments Diminished sensation to monofilament bilaterally on plantar surface.         Assessment & Plan:   Problem List Items Addressed This Visit       Unprioritized   Hyperlipidemia    Lab Results  Component Value Date   CHOL 192 12/15/2021   HDL 64.80 12/15/2021   LDLCALC 101 (H) 12/15/2021   TRIG 135.0 12/15/2021   CHOLHDL 3 12/15/2021  Maintained on atorvastatin. Continue current dose.       Relevant Medications   carvedilol (COREG) 12.5 MG tablet   HTN (hypertension)    BP Readings from Last 3 Encounters:  06/22/22 (!) 155/82  03/17/22 135/60  12/15/21 137/72  Maintained on losartan, amlodipine and metoprolol.  Uncontrolled.  Will d/c metoprolol and begin carvedilol instead.       Relevant Medications   carvedilol (COREG) 12.5 MG tablet   GERD (gastroesophageal reflux disease)    Improved on omeprazole in place of pepcid.  Continue same.  Controlled type 2 diabetes mellitus without complication, without long-term current use of insulin (HCC)    Lab Results  Component Value Date   HGBA1C 7.9 (H) 03/17/2022   HGBA1C 8.4 (H) 12/15/2021   HGBA1C 7.9 (H) 03/25/2021   Lab Results  Component Value Date   MICROALBUR <0.7 03/17/2022   LDLCALC 101 (H) 12/15/2021   CREATININE 0.93 03/17/2022  Will obtain follow up A1C.  Maintained on januvia and metformin.       Relevant Medications   blood glucose meter kit and supplies KIT   Other Relevant Orders   HgB W9U   Basic Metabolic Panel (BMET)   Other Visit Diagnoses     Ecchymosis    -  Primary   Relevant Orders   CBC w/Diff   Encounter for hepatitis C screening test for low risk patient       Relevant Orders    Hepatitis C Antibody        Meds ordered this encounter  Medications   blood glucose meter kit and supplies KIT    Sig: Dispense based on patient and insurance preference. Use up to four times daily as directed.    Dispense:  1 each    Refill:  0    Order Specific Question:   Supervising Provider    Answer:   Penni Homans A [id number]    Order Specific Question:   Number of strips    Answer:   100    Order Specific Question:   Number of lancets    Answer:   100   carvedilol (COREG) 12.5 MG tablet    Sig: Take 1 tablet (12.5 mg total) by mouth 2 (two) times daily with a meal.    Dispense:  180 tablet    Refill:  0    Order Specific Question:   Supervising Provider    Answer:   Penni Homans A [[id number]]   Flu shot today.   I, Nance Pear, Douglas, personally preformed the services described in this documentation.  All medical record entries made by the scribe were at my direction and in my presence.  I have reviewed the chart and discharge instructions (if applicable) and agree that the record reflects my personal performance and is accurate and complete. 06/22/2021   I,Nicole Douglas,acting as a scribe for Nance Pear, Douglas.,have documented all relevant documentation on the behalf of Nance Pear, Douglas,as directed by  Nance Pear, Douglas while in the presence of Nance Pear, Douglas.   Nance Pear, Douglas

## 2022-06-22 NOTE — Telephone Encounter (Signed)
Please call [facility] on [address] and Nicole Douglas and request date of Shingrix vaccine.

## 2022-06-23 LAB — HEPATITIS C ANTIBODY: Hepatitis C Ab: NONREACTIVE

## 2022-07-02 ENCOUNTER — Encounter: Payer: Self-pay | Admitting: Family

## 2022-07-02 DIAGNOSIS — H25011 Cortical age-related cataract, right eye: Secondary | ICD-10-CM | POA: Diagnosis not present

## 2022-07-02 DIAGNOSIS — Z961 Presence of intraocular lens: Secondary | ICD-10-CM | POA: Diagnosis not present

## 2022-07-02 DIAGNOSIS — H52203 Unspecified astigmatism, bilateral: Secondary | ICD-10-CM | POA: Diagnosis not present

## 2022-07-02 DIAGNOSIS — H524 Presbyopia: Secondary | ICD-10-CM | POA: Diagnosis not present

## 2022-07-02 DIAGNOSIS — H2511 Age-related nuclear cataract, right eye: Secondary | ICD-10-CM | POA: Diagnosis not present

## 2022-07-02 DIAGNOSIS — H40003 Preglaucoma, unspecified, bilateral: Secondary | ICD-10-CM | POA: Diagnosis not present

## 2022-07-07 ENCOUNTER — Telehealth: Payer: Self-pay | Admitting: Family

## 2022-07-07 NOTE — Telephone Encounter (Signed)
Pt stated ever since switching her medication the last time she came in she has been feeling dizzy non stop. She wanted to speak to a nurse so transferred to triage and advised a message would be sent to pcp.

## 2022-07-08 ENCOUNTER — Ambulatory Visit (INDEPENDENT_AMBULATORY_CARE_PROVIDER_SITE_OTHER): Payer: Medicare Other | Admitting: Family Medicine

## 2022-07-08 ENCOUNTER — Encounter: Payer: Self-pay | Admitting: Family Medicine

## 2022-07-08 VITALS — BP 140/80 | HR 94 | Temp 98.3°F | Resp 18 | Ht 66.0 in | Wt 166.8 lb

## 2022-07-08 DIAGNOSIS — I10 Essential (primary) hypertension: Secondary | ICD-10-CM | POA: Diagnosis not present

## 2022-07-08 DIAGNOSIS — R42 Dizziness and giddiness: Secondary | ICD-10-CM | POA: Diagnosis not present

## 2022-07-08 NOTE — Telephone Encounter (Signed)
Noted  

## 2022-07-08 NOTE — Progress Notes (Addendum)
Subjective:   By signing my name below, I, [name], attest that this documentation has been prepared under the direction and in the presence of Ann Held, DO. 07/08/2022   Patient ID: Nicole Douglas, female    DOB: Nov 12, 1945, 77 y.o.   MRN: [id number]  Chief Complaint  Patient presents with   Dizziness    Pt states dizziness started 5 days ago. Pt reports headache and blurred vision. Pt states blood pressure have been up and down.    Dizziness Pertinent negatives include no abdominal pain, chest pain, congestion, fever, headaches, nausea or rash.   Patient is in today for a office visit.  She complains of dizziness for the past 5 days. Her dizziness has improved today. She also feels fatigued today. She has a history of vertigo. Her blood pressure and pulse is running higher than normal. She is not doing anything new that might increase her blood pressure. Her dizziness typically worsens while her blood pressure is elevated. She continues taking 50 mg losartan 2x daily PO, 12.5 g carvedilol 2x daily PO, 10 mg amlodipine daily PO. She denies palpitations, chest pain, shortness of breath, fever at this time. She continues taking 25 mg meclizine to manage her dizziness. She has occasional dizziness and seen an eye doctor and found she has cataracts. She had a procedure done on her left eye but she continues having issues with her right eye. She has a procedure for her right eye in June 2024.  BP Readings from Last 3 Encounters:  07/08/22 (!) 140/80  06/22/22 (!) 155/82  03/17/22 135/60   Pulse Readings from Last 3 Encounters:  07/08/22 94  06/22/22 78  03/17/22 68    Past Medical History:  Diagnosis Date   Hyperglycemia    Hyperlipemia    Hypertension    Thyroid disease     Past Surgical History:  Procedure Laterality Date   ABDOMINAL HYSTERECTOMY     BACK SURGERY     BIOPSY BREAST     left breast   BREAST BIOPSY Left    benign    ROTATOR CUFF REPAIR      bilateral    Family History  Problem Relation Age of Onset   Hypertension Other        both sides of family   Heart disease Other    Cancer Brother        throat   Heart attack Mother        died at 64   Heart attack Sister        age 76   Prostate cancer Father     Social History   Socioeconomic History   Marital status: Married    Spouse name: Not on file   Number of children: Not on file   Years of education: Not on file   Highest education level: Not on file  Occupational History   Not on file  Tobacco Use   Smoking status: Never   Smokeless tobacco: Never  Substance and Sexual Activity   Alcohol use: No   Drug use: No   Sexual activity: Not on file  Other Topics Concern   Not on file  Social History Narrative   Retired from [employer]   3 grown children (oldest daughter in [place], Daughter local, Son deceased was murdered)   Married   Enjoys reading, crossword, adult coloring, sewing    No pets   Social Determinants of Radio broadcast assistant  Strain: Low Risk  (03/09/2021)   Overall Financial Resource Strain (CARDIA)    Difficulty of Paying Living Expenses: Not hard at all  Food Insecurity: No Food Insecurity (03/09/2021)   Hunger Vital Sign    Worried About Running Out of Food in the Last Year: Never true    Ran Out of Food in the Last Year: Never true  Transportation Needs: No Transportation Needs (03/09/2021)   PRAPARE - Administrator, Civil Service (Medical): No    Lack of Transportation (Non-Medical): No  Physical Activity: Sufficiently Active (03/09/2021)   Exercise Vital Sign    Days of Exercise per Week: 3 days    Minutes of Exercise per Session: 60 min  Stress: No Stress Concern Present (03/09/2021)   [employer] of Occupational Health - Occupational Stress Questionnaire    Feeling of Stress : Not at all  Social Connections: Moderately Integrated (03/09/2021)   Social Connection and Isolation Panel [NHANES]    Frequency of  Communication with Friends and Family: More than three times a week    Frequency of Social Gatherings with Friends and Family: Once a week    Attends Religious Services: More than 4 times per year    Active Member of [employer] or Organizations: No    Attends Banker Meetings: Never    Marital Status: Married  Catering manager Violence: Not At Risk (03/09/2021)   Humiliation, Afraid, Rape, and Kick questionnaire    Fear of Current or Ex-Partner: No    Emotionally Abused: No    Physically Abused: No    Sexually Abused: No    Outpatient Medications Prior to Visit  Medication Sig Dispense Refill   Accu-Chek Softclix Lancets lancets Use as directed to test up to 4 times daily 100 each 0   amLODipine (NORVASC) 10 MG tablet TAKE 1 TABLET BY MOUTH DAILY 100 tablet 2   aspirin 81 MG tablet Take 81 mg by mouth daily.     atorvastatin (LIPITOR) 10 MG tablet TAKE 1 TABLET BY MOUTH DAILY 100 tablet 2   blood glucose meter kit and supplies KIT Dispense based on patient and insurance preference. Use up to four times daily as directed. 1 each 0   Blood Glucose Monitoring Suppl (ACCU-CHEK GUIDE) w/Device KIT Use as directed up to 4 times daily 1 kit 0   Calcium Carbonate (CALTRATE 600 PO) Take by mouth daily.     carvedilol (COREG) 12.5 MG tablet Take 1 tablet (12.5 mg total) by mouth 2 (two) times daily with a meal. 180 tablet 0   estradiol (ESTRACE) 0.1 MG/GM vaginal cream Place vaginally.     famotidine (PEPCID) 20 MG tablet TAKE 1 TABLET BY MOUTH TWICE  DAILY 160 tablet 1   glucose blood (ACCU-CHEK GUIDE) test strip Use as directed to test blood sugar up to 4 times daily. 100 each 0   levothyroxine (SYNTHROID) 50 MCG tablet TAKE 1 TABLET BY MOUTH ONCE  DAILY EXCEPT ONE-HALF TABLET BY  MOUTH ON SUNDAYS 94 tablet 2   losartan (COZAAR) 50 MG tablet Take 2 tablets (100 mg total) by mouth daily. 180 tablet 1   meclizine (ANTIVERT) 25 MG tablet Take 1 tablet (25 mg total) by mouth 3 (three) times  daily as needed for dizziness. 30 tablet 0   metFORMIN (GLUCOPHAGE) 1000 MG tablet TAKE 1 TABLET BY MOUTH TWICE  DAILY WITH A MEAL 200 tablet 1   omeprazole (PRILOSEC) 40 MG capsule TAKE 1 CAPSULE BY MOUTH DAILY 90  capsule 3   sitaGLIPtin (JANUVIA) 100 MG tablet Take 1 tablet (100 mg total) by mouth daily. 30 tablet 3   Zoster Vaccine Adjuvanted [facility]) injection Inject 0.5 mLs into the muscle now and then again in 2-6 months. 1 each 1   No facility-administered medications prior to visit.    No Known Allergies  Review of Systems  Constitutional:  Positive for malaise/fatigue. Negative for fever.  HENT:  Negative for congestion.   Eyes:  Negative for blurred vision.  Respiratory:  Negative for shortness of breath.   Cardiovascular:  Negative for chest pain, palpitations and leg swelling.  Gastrointestinal:  Negative for abdominal pain, blood in stool and nausea.  Genitourinary:  Negative for dysuria and frequency.  Musculoskeletal:  Negative for falls.  Skin:  Negative for rash.  Neurological:  Positive for dizziness. Negative for loss of consciousness and headaches.  Endo/Heme/Allergies:  Negative for environmental allergies.  Psychiatric/Behavioral:  Negative for depression. The patient is not nervous/anxious.        Objective:    Physical Exam Vitals and nursing note reviewed.  Constitutional:      General: She is not in acute distress.    Appearance: Normal appearance. She is not ill-appearing.  HENT:     Head: Normocephalic and atraumatic.     Right Ear: External ear normal.     Left Ear: External ear normal.  Eyes:     Extraocular Movements: Extraocular movements intact.     Pupils: Pupils are equal, round, and reactive to light.  Cardiovascular:     Rate and Rhythm: Normal rate and regular rhythm.     Heart sounds: Normal heart sounds. No murmur heard.    No gallop.  Pulmonary:     Effort: Pulmonary effort is normal. No respiratory distress.     Breath sounds:  Normal breath sounds. No wheezing or rales.  Skin:    General: Skin is warm and dry.  Neurological:     Mental Status: She is alert and oriented to person, place, and time.  Psychiatric:        Judgment: Judgment normal.     BP (!) 140/80 (BP Location: Left Arm, Patient Position: Sitting, Cuff Size: Normal)   Pulse 94   Temp 98.3 F (36.8 C) (Oral)   Resp 18   Ht 5\' 6"  (1.676 m)   Wt 166 lb 12.8 oz (75.7 kg)   SpO2 97%   BMI 26.92 kg/m  Wt Readings from Last 3 Encounters:  07/08/22 166 lb 12.8 oz (75.7 kg)  06/22/22 170 lb (77.1 kg)  03/17/22 171 lb (77.6 kg)   EKG---     Assessment & Plan:  Primary hypertension -     EKG 12-Lead  Dizziness -     EKG 12-Lead    I, Ann Held, DO, personally preformed the services described in this documentation.  All medical record entries made by the scribe were at my direction and in my presence.  I have reviewed the chart and discharge instructions (if applicable) and agree that the record reflects my personal performance and is accurate and complete. 07/08/2022   I,[name],acting as a scribe for Ann Held, DO.,have documented all relevant documentation on the behalf of Ann Held, DO,as directed by  Ann Held, DO while in the presence of Ann Held, DO.   Ann Held, DO

## 2022-07-09 NOTE — Assessment & Plan Note (Signed)
Increase coreg 25 mg bid  F/u 2-3 weeks  Or sooner as needed  EKG -- no acute changes

## 2022-07-13 ENCOUNTER — Ambulatory Visit: Payer: Medicare Other

## 2022-07-15 ENCOUNTER — Other Ambulatory Visit: Payer: Self-pay | Admitting: Family

## 2022-07-19 ENCOUNTER — Other Ambulatory Visit (HOSPITAL_BASED_OUTPATIENT_CLINIC_OR_DEPARTMENT_OTHER): Payer: Self-pay

## 2022-07-19 ENCOUNTER — Other Ambulatory Visit: Payer: Self-pay | Admitting: Family

## 2022-07-19 MED ORDER — ACCU-CHEK GUIDE VI STRP
ORAL_STRIP | 0 refills | Status: DC
  Filled 2022-07-19: qty 100, 25d supply, fill #0

## 2022-07-19 MED ORDER — ACCU-CHEK SOFTCLIX LANCETS MISC
0 refills | Status: DC
  Filled 2022-07-19: qty 100, 25d supply, fill #0

## 2022-07-19 NOTE — Telephone Encounter (Signed)
Pt called to verify we got rx request as she will be out by lunch.   [address], [place], [place] [address]

## 2022-07-22 ENCOUNTER — Ambulatory Visit (INDEPENDENT_AMBULATORY_CARE_PROVIDER_SITE_OTHER): Payer: Medicare Other

## 2022-07-22 DIAGNOSIS — I10 Essential (primary) hypertension: Secondary | ICD-10-CM

## 2022-07-22 NOTE — Progress Notes (Signed)
Pt here for Blood pressure check per melissa  Pt currently takes: Amlodipine 10 daily, Carvedilol 25 MG (changed at visit with Lowne , Losartan 50 MG   Pt reports compliance with medication.  BP today @ = 132/72 HR = 77  Spoke with Kim regarding appointment. DOD was behind but appears nurse visit was previously scheduled prior to visit with Lowne. In Lowne's note patient was advised to follow up in 2-3 weeks. After speaking with the patient, pt states she is still experiencing dizziness that comes and goes when she stands, "rolls her eyes", or turn over in her bed. Pt was scheduled to see Melissa on Tuesday for the dizziness.

## 2022-07-26 ENCOUNTER — Ambulatory Visit (INDEPENDENT_AMBULATORY_CARE_PROVIDER_SITE_OTHER): Payer: Medicare Other | Admitting: Family

## 2022-07-26 VITALS — BP 133/76 | HR 77 | Temp 97.9°F | Resp 16 | Wt 168.0 lb

## 2022-07-26 DIAGNOSIS — H8110 Benign paroxysmal vertigo, unspecified ear: Secondary | ICD-10-CM

## 2022-07-26 DIAGNOSIS — R011 Cardiac murmur, unspecified: Secondary | ICD-10-CM | POA: Diagnosis not present

## 2022-07-26 DIAGNOSIS — I10 Essential (primary) hypertension: Secondary | ICD-10-CM | POA: Diagnosis not present

## 2022-07-26 DIAGNOSIS — R42 Dizziness and giddiness: Secondary | ICD-10-CM

## 2022-07-26 NOTE — Progress Notes (Signed)
Subjective:   By signing my name below, I, [name], attest that this documentation has been prepared under the direction and in the presence of [name], NP. 07/26/2022   Patient ID: Nicole Douglas, female    DOB: 1946/03/01, 77 y.o.   MRN: [id number]  Chief Complaint  Patient presents with   Hypertension    Here for follow up    Hypertension   Patient is in today for a office visit.   Dizziness: She complains of intermittent dizziness 2 weeks after starting 12.5 mg carvedilol daily PO. Standing up and turning her eye  typically causes her dizziness. She is also getting tired easier than normal.   Blood pressure: Her blood pressure is doing well during this visit. She continues taking 10 mg amlodipine daily PO, 50 mg losartan daily PO.  BP Readings from Last 3 Encounters:  07/26/22 133/76  07/08/22 (!) 140/80  06/22/22 (!) 155/82   Pulse Readings from Last 3 Encounters:  07/26/22 77  07/08/22 94  06/22/22 78   Blood sugar: She continues measuring her blood sugar and reports they typically measure 120-160 in the morning then 2 hours after breakfast measure 109-222. She day she measured high blood sugars she ate sweet potato and and apple. She also notes it measured 216 after eating this morning.  Lab Results  Component Value Date   HGBA1C 7.3 (H) 06/22/2022    Past Medical History:  Diagnosis Date   Hyperglycemia    Hyperlipemia    Hypertension    Thyroid disease     Past Surgical History:  Procedure Laterality Date   ABDOMINAL HYSTERECTOMY     BACK SURGERY     BIOPSY BREAST     left breast   BREAST BIOPSY Left    benign    ROTATOR CUFF REPAIR     bilateral    Family History  Problem Relation Age of Onset   Hypertension Other        both sides of family   Heart disease Other    Cancer Brother        throat   Heart attack Mother        died at 16   Heart attack Sister        age 23   Prostate cancer Father     Social History    Socioeconomic History   Marital status: Married    Spouse name: Not on file   Number of children: Not on file   Years of education: Not on file   Highest education level: Not on file  Occupational History   Not on file  Tobacco Use   Smoking status: Never   Smokeless tobacco: Never  Substance and Sexual Activity   Alcohol use: No   Drug use: No   Sexual activity: Not on file  Other Topics Concern   Not on file  Social History Narrative   Retired from [employer]   3 grown children (oldest daughter in [place], Daughter local, Son deceased was murdered)   Married   Enjoys reading, crossword, adult coloring, sewing    No pets   Social Determinants of Health   Financial Resource Strain: Low Risk  (03/09/2021)   Overall Financial Resource Strain (CARDIA)    Difficulty of Paying Living Expenses: Not hard at all  Food Insecurity: No Food Insecurity (03/09/2021)   Hunger Vital Sign    Worried About Running Out of Food in the Last Year: Never true  Ran Out of Food in the Last Year: Never true  Transportation Needs: No Transportation Needs (03/09/2021)   PRAPARE - Hydrologist (Medical): No    Lack of Transportation (Non-Medical): No  Physical Activity: Sufficiently Active (03/09/2021)   Exercise Vital Sign    Days of Exercise per Week: 3 days    Minutes of Exercise per Session: 60 min  Stress: No Stress Concern Present (03/09/2021)   [place]    Feeling of Stress : Not at all  Social Connections: Moderately Integrated (03/09/2021)   Social Connection and Isolation Panel [NHANES]    Frequency of Communication with Friends and Family: More than three times a week    Frequency of Social Gatherings with Friends and Family: Once a week    Attends Religious Services: More than 4 times per year    Active Member of [employer] or Organizations: No    Attends Archivist Meetings: Never     Marital Status: Married  Human resources officer Violence: Not At Risk (03/09/2021)   Humiliation, Afraid, Rape, and Kick questionnaire    Fear of Current or Ex-Partner: No    Emotionally Abused: No    Physically Abused: No    Sexually Abused: No    Outpatient Medications Prior to Visit  Medication Sig Dispense Refill   Accu-Chek Softclix Lancets lancets Use as directed to test up to 4 times daily 100 each 0   amLODipine (NORVASC) 10 MG tablet TAKE 1 TABLET BY MOUTH DAILY 100 tablet 2   aspirin 81 MG tablet Take 81 mg by mouth daily.     atorvastatin (LIPITOR) 10 MG tablet TAKE 1 TABLET BY MOUTH DAILY 100 tablet 2   blood glucose meter kit and supplies KIT Dispense based on patient and insurance preference. Use up to four times daily as directed. 1 each 0   Blood Glucose Monitoring Suppl (ACCU-CHEK GUIDE) w/Device KIT Use as directed up to 4 times daily 1 kit 0   Calcium Carbonate (CALTRATE 600 PO) Take by mouth daily.     carvedilol (COREG) 12.5 MG tablet Take 1 tablet (12.5 mg total) by mouth 2 (two) times daily with a meal. 180 tablet 0   estradiol (ESTRACE) 0.1 MG/GM vaginal cream Place vaginally.     famotidine (PEPCID) 20 MG tablet TAKE 1 TABLET BY MOUTH TWICE  DAILY 160 tablet 1   glucose blood (ACCU-CHEK GUIDE) test strip Use as directed to test blood sugar up to 4 times daily. 100 each 0   levothyroxine (SYNTHROID) 50 MCG tablet TAKE 1 TABLET BY MOUTH ONCE  DAILY EXCEPT ONE-HALF TABLET BY  MOUTH ON SUNDAYS 94 tablet 2   losartan (COZAAR) 50 MG tablet TAKE 2 TABLETS BY MOUTH DAILY 200 tablet 2   meclizine (ANTIVERT) 25 MG tablet Take 1 tablet (25 mg total) by mouth 3 (three) times daily as needed for dizziness. 30 tablet 0   metFORMIN (GLUCOPHAGE) 1000 MG tablet TAKE 1 TABLET BY MOUTH TWICE  DAILY WITH A MEAL 200 tablet 1   omeprazole (PRILOSEC) 40 MG capsule TAKE 1 CAPSULE BY MOUTH DAILY 90 capsule 3   sitaGLIPtin (JANUVIA) 100 MG tablet Take 1 tablet (100 mg total) by mouth daily. 30  tablet 3   Zoster Vaccine Adjuvanted [facility]) injection Inject 0.5 mLs into the muscle now and then again in 2-6 months. 1 each 1   No facility-administered medications prior to visit.    No Known  Allergies  ROS See HPI    Objective:    Physical Exam Constitutional:      General: She is not in acute distress.    Appearance: Normal appearance. She is not ill-appearing.  HENT:     Head: Normocephalic and atraumatic.     Right Ear: External ear normal.     Left Ear: External ear normal.  Eyes:     Extraocular Movements: Extraocular movements intact.     Pupils: Pupils are equal, round, and reactive to light.  Cardiovascular:     Rate and Rhythm: Normal rate and regular rhythm.     Heart sounds: Murmur heard.     Systolic murmur is present with a grade of 2/6.     No gallop.  Pulmonary:     Effort: Pulmonary effort is normal. No respiratory distress.     Breath sounds: Normal breath sounds. No wheezing or rales.  Skin:    General: Skin is warm and dry.  Neurological:     Mental Status: She is alert and oriented to person, place, and time.     Comments: Positive left dix-hallpike test Mildly positive right dix-hallpike test  Psychiatric:        Judgment: Judgment normal.     BP 133/76 (BP Location: Right Arm, Patient Position: Sitting, Cuff Size: Large)   Pulse 77   Temp 97.9 F (36.6 C) (Oral)   Resp 16   Wt 168 lb (76.2 kg)   SpO2 100%   BMI 27.12 kg/m  Wt Readings from Last 3 Encounters:  07/26/22 168 lb (76.2 kg)  07/08/22 166 lb 12.8 oz (75.7 kg)  06/22/22 170 lb (77.1 kg)       Assessment & Plan:  Benign paroxysmal positional vertigo, unspecified laterality Assessment & Plan: New. Will refer to PT for vestibular rehab.  Orders: -     Ambulatory referral to Physical Therapy  Cardiac murmur Assessment & Plan: It looks like she never completed the 2D echo back in 2022. Will re-order.   Orders: -     ECHOCARDIOGRAM COMPLETE; Future  Primary  hypertension Assessment & Plan: BP Readings from Last 3 Encounters:  07/26/22 133/76  07/08/22 (!) 140/80  06/22/22 (!) 155/82   BP is stable. Continue current dose of carvedilol/amlodipine.      I, Nance Pear, NP, personally preformed the services described in this documentation.  All medical record entries made by the scribe were at my direction and in my presence.  I have reviewed the chart and discharge instructions (if applicable) and agree that the record reflects my personal performance and is accurate and complete. 07/26/2022   I,[name],acting as a scribe for Nance Pear, NP.,have documented all relevant documentation on the behalf of Nance Pear, NP,as directed by  Nance Pear, NP while in the presence of Nance Pear, NP.   Nance Pear, NP

## 2022-07-28 ENCOUNTER — Telehealth: Payer: Self-pay | Admitting: Family

## 2022-07-28 DIAGNOSIS — H8110 Benign paroxysmal vertigo, unspecified ear: Secondary | ICD-10-CM | POA: Insufficient documentation

## 2022-07-28 NOTE — Assessment & Plan Note (Signed)
BP Readings from Last 3 Encounters:  07/26/22 133/76  07/08/22 (!) 140/80  06/22/22 (!) 155/82   BP is stable. Continue current dose of carvedilol/amlodipine.

## 2022-07-28 NOTE — Telephone Encounter (Signed)
Please advise pt that I noticed she did not complete the echo of her heart I ordered back in 2022. I still hear a murmur on exam and I have placed an order for echo.

## 2022-07-28 NOTE — Assessment & Plan Note (Signed)
It looks like she never completed the 2D echo back in 2022. Will re-order.

## 2022-07-28 NOTE — Assessment & Plan Note (Signed)
New. Will refer to PT for vestibular rehab.

## 2022-07-28 NOTE — Telephone Encounter (Signed)
Patient was advised of provider's concern and new order for echo. She verbalized understanding

## 2022-08-04 ENCOUNTER — Other Ambulatory Visit: Payer: Self-pay | Admitting: Family

## 2022-08-04 ENCOUNTER — Other Ambulatory Visit (HOSPITAL_BASED_OUTPATIENT_CLINIC_OR_DEPARTMENT_OTHER): Payer: Self-pay

## 2022-08-04 ENCOUNTER — Ambulatory Visit: Payer: Medicare Other | Attending: Family | Admitting: Physical Therapy

## 2022-08-04 DIAGNOSIS — R42 Dizziness and giddiness: Secondary | ICD-10-CM | POA: Diagnosis not present

## 2022-08-04 DIAGNOSIS — H8112 Benign paroxysmal vertigo, left ear: Secondary | ICD-10-CM | POA: Diagnosis not present

## 2022-08-04 DIAGNOSIS — H8110 Benign paroxysmal vertigo, unspecified ear: Secondary | ICD-10-CM | POA: Insufficient documentation

## 2022-08-04 MED ORDER — ACCU-CHEK SOFTCLIX LANCETS MISC
1.0000 | Freq: Four times a day (QID) | 12 refills | Status: DC
  Filled 2022-08-04: qty 200, fill #0
  Filled 2022-08-10: qty 200, 50d supply, fill #0
  Filled 2022-11-16: qty 200, 50d supply, fill #1
  Filled 2023-01-07: qty 200, 50d supply, fill #2
  Filled 2023-03-15: qty 200, 50d supply, fill #3

## 2022-08-04 MED ORDER — ACCU-CHEK GUIDE VI STRP
1.0000 | ORAL_STRIP | Freq: Four times a day (QID) | 12 refills | Status: DC
  Filled 2022-08-04 – 2022-08-10 (×2): qty 200, 50d supply, fill #0
  Filled 2022-11-16: qty 200, 50d supply, fill #1
  Filled 2023-01-07: qty 200, 50d supply, fill #2
  Filled 2023-03-15: qty 200, 50d supply, fill #3

## 2022-08-04 NOTE — Therapy (Signed)
OUTPATIENT PHYSICAL THERAPY VESTIBULAR EVALUATION     Patient Name: Nicole Douglas MRN: [id number] DOB:May 15, 1946, 77 y.o., female Today's Date: 08/04/2022  END OF SESSION:  PT End of Session - 08/04/22 1644     Visit Number 1    Number of Visits 6    Date for PT Re-Evaluation 09/15/22    Authorization Type [employer] Medicare    Progress Note Due on Visit 6    PT Start Time 1540    PT Stop Time 1640    PT Time Calculation (min) 60 min    Activity Tolerance Patient tolerated treatment well    Behavior During Therapy WFL for tasks assessed/performed             Past Medical History:  Diagnosis Date   Hyperglycemia    Hyperlipemia    Hypertension    Thyroid disease    Past Surgical History:  Procedure Laterality Date   ABDOMINAL HYSTERECTOMY     BACK SURGERY     BIOPSY BREAST     left breast   BREAST BIOPSY Left    benign    ROTATOR CUFF REPAIR     bilateral   Patient Active Problem List   Diagnosis Date Noted   Benign paroxysmal positional vertigo 07/28/2022   Bladder prolapse, female, acquired 12/15/2021   Fall 03/03/2021   Cardiac murmur 12/23/2020   Rectal bleeding 12/23/2020   Controlled type 2 diabetes mellitus without complication, without long-term current use of insulin ([place]) 05/12/2020   Post menopausal syndrome 04/13/2016   Hypothyroidism 10/01/2014   GERD (gastroesophageal reflux disease) 03/27/2014   Pelvic pressure in female 03/27/2014   Physical exam 03/27/2014   Tinnitus 10/31/2013   Right hand pain 10/26/2013   Dizziness 10/19/2013   HTN (hypertension) 09/20/2013   Hyperlipidemia 09/20/2013   Degenerative disc disease, lumbar 09/20/2013   Lumbar strain 09/15/2013   PARESTHESIA 03/10/2007   PELVIC  PAIN 03/10/2007   PALPITATIONS, HX OF 03/10/2007   COLONOSCOPY, HX OF 03/10/2007    PCP: [name], NP  REFERRING PROVIDER: [name], NP   REFERRING DIAG: R42 (ICD-10-CM) - Vertigo   THERAPY DIAG:  BPPV (benign  paroxysmal positional vertigo), left  Dizziness and giddiness  ONSET DATE: 2 weeks ago (beginning of February, end January)  Rationale for Evaluation and Treatment: Rehabilitation  SUBJECTIVE:   SUBJECTIVE STATEMENT: Patient reports history of vertigo off and on over the years but this started 2 weeks ago, mostly when getting up, sitting down, bending over, or rolling over in bed.  Even rolling her eyes can make her dizzy.  She was given medication, but this time the pills aren't helping.   Did not take any meclizine today.   Pt accompanied by: self  PERTINENT HISTORY: history of vertigo  PAIN:  Are you having pain?  Back pain but not related to vertigo.  PRECAUTIONS: None  WEIGHT BEARING RESTRICTIONS: No  FALLS: Has patient fallen in last 6 months? No  LIVING ENVIRONMENT: Lives with: lives with their spouse Lives in: House/apartment Stairs: No Has following equipment at home: Single point cane  PLOF: Independent  PATIENT GOALS: to stop being dizzy  OBJECTIVE:   DIAGNOSTIC FINDINGS: NA  COGNITION: Overall cognitive status: Within functional limits for tasks assessed  POSTURE:  rounded shoulders and forward head  Cervical ROM:    Active AROM (deg) eval  Flexion 40  Extension 45  Right rotation 60  Left rotation 55  (Blank rows = not tested)  STRENGTH: 5/5 bil  UE and LE strength.    GAIT: Gait pattern: WFL Distance walked: 5' Assistive device utilized: None Level of assistance: Complete Independence Comments: slightly antalgic due to LBP  FUNCTIONAL TESTS:  NT today  PATIENT SURVEYS:  DHI 20% impairment  VESTIBULAR ASSESSMENT:  GENERAL OBSERVATION: enters independently, reporting "catch" in back, difficulty with transfers due to pain.  Very guarded movements with head movements.     SYMPTOM BEHAVIOR:  Subjective history: vertigo symptoms over last 2 weeks.  Reports history of vertigo in the past, but these symptoms have not improved on their  own.  Tries not to make sudden moves but sometimes forgets.    Non-Vestibular symptoms: tinnitus - chronic  Type of dizziness: Spinning/Vertigo  Frequency: daily for last 2 weeks  Duration: at worst 30 min, other incidents 1-2 min  Aggravating factors: Induced by position change: rolling to the left, supine to sit, and sit to stand, Induced by motion: bending down to the ground and turning head quickly, and Moving eyes  Relieving factors: lying supine, medication, and closing eyes  Progression of symptoms: unchanged  OCULOMOTOR EXAM:  Ocular Alignment: normal  Ocular ROM:  difficulty with upward gaze - feels "tight"  Spontaneous Nystagmus: absent  Gaze-Induced Nystagmus: absent  Smooth Pursuits: saccades  Saccades: slow and difficulty tracking vertically  Convergence/Divergence: 9 cm    VESTIBULAR - OCULAR REFLEX:   Slow VOR: Positive Left  VOR Cancellation: Corrective Saccades  Head-Impulse Test: HIT Right: negative HIT Left: positive  Dynamic Visual Acuity:  NT   POSITIONAL TESTING: Right Dix-Hallpike: no nystagmus Left Dix-Hallpike: no nystagmus Right Roll Test: no nystagmus Left Roll Test: nystagmus based on eye movement under eyelids, but could not get patient to open eyes due to severity of symptoms.   MOTION SENSITIVITY:  Motion Sensitivity Quotient Intensity: 0 = none, 1 = Lightheaded, 2 = Mild, 3 = Moderate, 4 = Severe, 5 = Vomiting  Intensity  1. Sitting to supine   2. Supine to L side   3. Supine to R side   4. Supine to sitting   5. L Hallpike-Dix 3  6. Up from L  3  7. R Hallpike-Dix 2  8. Up from R  2  9. Sitting, head tipped to L knee 3  10. Head up from L knee 3  11. Sitting, head tipped to R knee 2  12. Head up from R knee 2  13. Sitting head turns x5 3  14.Sitting head nods x5 2  15. In stance, 180 turn to L    16. In stance, 180 turn to R     OTHOSTATICS: not done  FUNCTIONAL GAIT: not done   VESTIBULAR TREATMENT:                                                                                                    DATE:   08/04/22  Canalith Repositioning:  Appiani/Gufoni Horizontal Geotropic: Number of Reps: 1, tolerated well Self Care: See patient education   PATIENT EDUCATION: Education details: educated on anatomy, findings, POC, gufoni maneuver for home, need for further  vestibular rehab, recommendations.   Person educated: Patient Education method: Explanation, Demonstration, Verbal cues, and Handouts Education comprehension: verbalized understanding  HOME EXERCISE PROGRAM: [email or web address]  GOALS: Goals reviewed with patient? Yes  SHORT TERM GOALS: Target date: 08/18/2022   Patient will report compliance with initial HEP.  Baseline: Goal status: INITIAL  2.  Patient will report resolution of dizziness rolling over in bed.  Baseline:  Goal status: INITIAL  3.  Patient will complete further balance testing.  Baseline:  Goal status: INITIAL  LONG TERM GOALS: Target date: 09/15/2022    Patient will be independent with progressed HEP to improve outcomes and carryover.  Baseline:  Goal status: INITIAL  2.  Patient will report 75% improvement in dizziness. Baseline:  Goal status: INITIAL  3.  Patient will demonstrate >19/24 on DGI to decrease risk of falls.  Baseline:  Goal status: INITIAL  4.  Patient will report 2% or less on DHI to demonstrate improved QOL. Baseline: 20% impairment Goal status: INITIAL   ASSESSMENT:  CLINICAL IMPRESSION: Nicole Douglas  is a 77 y.o. right hand dominant female who was seen today for physical therapy evaluation and treatment for vertigo.  She demonstrates L horizontal canalithiasis, however due to severity of symptoms, kept eyes closed and could not determine if geotropic or ageotropic.  Since she is having back pain, started with [facility] as would be easier on her back, as she was  having difficulty with transitions today due to LBP.  She tolerated well and was given maneuver for HEP today.  Also noted significant saccades, difficulty with upward gaze, and sensitivity to eye movements as well, so dicussed importance of vestibular rehab even after BPPV resolved, since not all of her symptoms appear to be just BPPV.   Discussed taking meclizine before next session to improve tolerance to canalith repositioning.  Due to copay she prefers only 1x/week visits, discussed importance of compliance with HEP.    OBJECTIVE IMPAIRMENTS: decreased activity tolerance and dizziness.   ACTIVITY LIMITATIONS: bending, transfers, and bed mobility  PARTICIPATION LIMITATIONS: cleaning, laundry, shopping, and community activity  PERSONAL FACTORS: Age, Past/current experiences, Time since onset of injury/illness/exacerbation, and 1-2 comorbidities: lumbar radiculopathy, DDD, HTN, hypothyroidism  are also affecting patient's functional outcome.   REHAB POTENTIAL: Good  CLINICAL DECISION MAKING: Evolving/moderate complexity  EVALUATION COMPLEXITY: Moderate   PLAN:  PT FREQUENCY: 1-2x/week  PT DURATION: 6 weeks  PLANNED INTERVENTIONS: Therapeutic exercises, Therapeutic activity, Neuromuscular re-education, Balance training, Gait training, Patient/Family education, Self Care, Joint mobilization, Vestibular training, Canalith repositioning, Electrical stimulation, Spinal mobilization, Cryotherapy, Moist heat, Manual therapy, and Re-evaluation  PLAN FOR NEXT SESSION: reassess canals, canalith repositioning if needed, balance testing, VOR exercises.     Rennie Natter, PT, DPT  08/04/2022, 5:04 PM

## 2022-08-06 ENCOUNTER — Telehealth: Payer: Self-pay | Admitting: Family

## 2022-08-06 NOTE — Telephone Encounter (Signed)
Prescription Request  08/06/2022  Is this a "Controlled Substance" medicine? No  LOV: 07/26/2022  What is the name of the medication or equipment?   carvedilol (COREG) 12.5 MG tablet [id number]   Have you contacted your pharmacy to request a refill? No   Which pharmacy would you like this sent to?   [place], [address] [place] [address] Phone: [phone] Fax: [phone]  Patient notified that their request is being sent to the clinical staff for review and that they should receive a response within 2 business days.   Please advise at Mobile [phone] (mobile)  Pt would also like to know if she should still take two per instructions for this medication.

## 2022-08-06 NOTE — Telephone Encounter (Signed)
[facility] was [facility] 06/22/22 for 90 days supply

## 2022-08-10 ENCOUNTER — Other Ambulatory Visit (HOSPITAL_BASED_OUTPATIENT_CLINIC_OR_DEPARTMENT_OTHER): Payer: Self-pay

## 2022-08-11 ENCOUNTER — Encounter: Payer: Medicare Other | Admitting: Physical Therapy

## 2022-08-12 ENCOUNTER — Other Ambulatory Visit (HOSPITAL_BASED_OUTPATIENT_CLINIC_OR_DEPARTMENT_OTHER): Payer: Self-pay

## 2022-08-12 ENCOUNTER — Ambulatory Visit (HOSPITAL_BASED_OUTPATIENT_CLINIC_OR_DEPARTMENT_OTHER): Payer: Medicare Other

## 2022-08-17 ENCOUNTER — Other Ambulatory Visit: Payer: Self-pay | Admitting: Family

## 2022-08-18 ENCOUNTER — Encounter: Payer: Medicare Other | Admitting: Physical Therapy

## 2022-08-25 ENCOUNTER — Ambulatory Visit: Payer: Medicare Other | Admitting: Physical Therapy

## 2022-08-26 ENCOUNTER — Encounter: Payer: Medicare Other | Admitting: Physical Therapy

## 2022-08-27 ENCOUNTER — Other Ambulatory Visit: Payer: Self-pay | Admitting: Family

## 2022-08-30 ENCOUNTER — Encounter: Payer: Medicare Other | Admitting: Physical Therapy

## 2022-09-01 ENCOUNTER — Encounter: Payer: Medicare Other | Admitting: Physical Therapy

## 2022-09-03 ENCOUNTER — Encounter: Payer: Medicare Other | Admitting: Physical Therapy

## 2022-11-01 ENCOUNTER — Other Ambulatory Visit: Payer: Self-pay | Admitting: Family

## 2022-11-03 ENCOUNTER — Telehealth: Payer: Self-pay | Admitting: Family

## 2022-11-03 NOTE — Telephone Encounter (Signed)
Prescription Request  11/03/2022  Is this a "Controlled Substance" medicine? No  LOV: 07/26/2022  What is the name of the medication or equipment?   meclizine (ANTIVERT) 25 MG tablet [[id number]]   Have you contacted your pharmacy to request a refill? No   Which pharmacy would you like this sent to?   [facility] - [place], [place] - [address] [place] [address] Phone: [phone] Fax: [phone]  Patient notified that their request is being sent to the clinical staff for review and that they should receive a response within 2 business days.   Please advise at Mobile [phone] (mobile)

## 2022-11-04 MED ORDER — MECLIZINE HCL 25 MG PO TABS: 25.0000 mg | ORAL_TABLET | Freq: Three times a day (TID) | ORAL | 0 refills | Status: DC | PRN

## 2022-11-04 NOTE — Addendum Note (Signed)
Addended by: Sandford Craze on: 11/04/2022 10:20 PM   Modules accepted: Orders

## 2022-11-16 ENCOUNTER — Other Ambulatory Visit (HOSPITAL_BASED_OUTPATIENT_CLINIC_OR_DEPARTMENT_OTHER): Payer: Self-pay

## 2022-11-30 ENCOUNTER — Other Ambulatory Visit: Payer: Self-pay | Admitting: Family

## 2022-12-13 ENCOUNTER — Ambulatory Visit (INDEPENDENT_AMBULATORY_CARE_PROVIDER_SITE_OTHER): Payer: Medicare Other | Admitting: Family

## 2022-12-13 ENCOUNTER — Telehealth: Payer: Self-pay | Admitting: Family

## 2022-12-13 ENCOUNTER — Other Ambulatory Visit: Payer: Self-pay

## 2022-12-13 VITALS — BP 139/79 | HR 89 | Temp 98.0°F | Resp 16 | Wt 163.0 lb

## 2022-12-13 DIAGNOSIS — Z7985 Long-term (current) use of injectable non-insulin antidiabetic drugs: Secondary | ICD-10-CM

## 2022-12-13 DIAGNOSIS — I10 Essential (primary) hypertension: Secondary | ICD-10-CM | POA: Diagnosis not present

## 2022-12-13 DIAGNOSIS — E119 Type 2 diabetes mellitus without complications: Secondary | ICD-10-CM | POA: Diagnosis not present

## 2022-12-13 DIAGNOSIS — E785 Hyperlipidemia, unspecified: Secondary | ICD-10-CM

## 2022-12-13 DIAGNOSIS — R600 Localized edema: Secondary | ICD-10-CM | POA: Diagnosis not present

## 2022-12-13 DIAGNOSIS — Z7984 Long term (current) use of oral hypoglycemic drugs: Secondary | ICD-10-CM | POA: Diagnosis not present

## 2022-12-13 DIAGNOSIS — Z1159 Encounter for screening for other viral diseases: Secondary | ICD-10-CM | POA: Diagnosis not present

## 2022-12-13 DIAGNOSIS — E038 Other specified hypothyroidism: Secondary | ICD-10-CM | POA: Diagnosis not present

## 2022-12-13 LAB — LIPID PANEL
Cholesterol: 190 mg/dL (ref 0–200)
HDL: 63.7 mg/dL (ref >39.00)
LDL Cholesterol: 94 mg/dL (ref 0–99)
NonHDL: 126.74
Total CHOL/HDL Ratio: 3
Triglycerides: 166 mg/dL — ABNORMAL HIGH (ref 0.0–149.0)
VLDL: 33.2 mg/dL (ref 0.0–40.0)

## 2022-12-13 LAB — COMPREHENSIVE METABOLIC PANEL
ALT: 21 U/L (ref 0–35)
AST: 14 U/L (ref 0–37)
Albumin: 4.2 g/dL (ref 3.5–5.2)
Alkaline Phosphatase: 82 U/L (ref 39–117)
BUN: 14 mg/dL (ref 6–23)
CO2: 29 mEq/L (ref 19–32)
Calcium: 9.9 mg/dL (ref 8.4–10.5)
Chloride: 103 mEq/L (ref 96–112)
Creatinine, Ser: 0.91 mg/dL (ref 0.40–1.20)
GFR: 60.47 mL/min (ref >60.00)
Glucose, Bld: 219 mg/dL — ABNORMAL HIGH (ref 70–99)
Potassium: 4.4 mEq/L (ref 3.5–5.1)
Sodium: 141 mEq/L (ref 135–145)
Total Bilirubin: 1.1 mg/dL (ref 0.2–1.2)
Total Protein: 6.9 g/dL (ref 6.0–8.3)

## 2022-12-13 LAB — TSH: TSH: 1.99 u[IU]/mL (ref 0.35–5.50)

## 2022-12-13 LAB — HEMOGLOBIN A1C: Hgb A1c MFr Bld: 7.1 % — ABNORMAL HIGH (ref 4.6–6.5)

## 2022-12-13 MED ORDER — ATORVASTATIN CALCIUM 10 MG PO TABS: 10.0000 mg | ORAL_TABLET | Freq: Every day | ORAL | 1 refills | Status: DC

## 2022-12-13 MED ORDER — METFORMIN HCL 1000 MG PO TABS: 1000.0000 mg | ORAL_TABLET | Freq: Two times a day (BID) | ORAL | 1 refills | Status: DC

## 2022-12-13 MED ORDER — CARVEDILOL 12.5 MG PO TABS: 12.5000 mg | ORAL_TABLET | Freq: Two times a day (BID) | ORAL | 1 refills | Status: DC

## 2022-12-13 MED ORDER — LEVOTHYROXINE SODIUM 50 MCG PO TABS: ORAL_TABLET | ORAL | 1 refills | Status: DC

## 2022-12-13 MED ORDER — AMLODIPINE BESYLATE 10 MG PO TABS: 10.0000 mg | ORAL_TABLET | Freq: Every day | ORAL | 1 refills | Status: DC

## 2022-12-13 MED ORDER — SITAGLIPTIN PHOSPHATE 100 MG PO TABS: 100.0000 mg | ORAL_TABLET | Freq: Every day | ORAL | 5 refills | Status: DC

## 2022-12-13 MED ORDER — BLOOD GLUCOSE MONITOR KIT: PACK | 0 refills | Status: AC

## 2022-12-13 NOTE — Assessment & Plan Note (Signed)
Clinically stable, tolerating lipid panel 10mg  once daily.

## 2022-12-13 NOTE — Assessment & Plan Note (Signed)
Well controlled on current regimen of Amlodipine 10mg  daily, Carvedilol 12.5mg  BID, and Losartan 100mg  daily. -Continue current medications.

## 2022-12-13 NOTE — Patient Instructions (Signed)
VISIT SUMMARY:  During your recent visit, we discussed your ongoing management of hypertension, diabetes, hyperlipidemia, and hypothyroidism. You reported that your blood pressure has been well-controlled, and your blood sugar levels have been fluctuating. You also mentioned experiencing significant swelling in your feet, especially during a recent trip. Your reflux is well-managed with omeprazole, and you feel okay on your current dose of Synthroid for your thyroid.  YOUR PLAN:  -HYPERTENSION: Your high blood pressure is well controlled with your current medications. We will continue with the same treatment.  -TYPE 2 DIABETES: Your blood sugar levels have been fluctuating. We will check your A1C levels and may consider adding a new medication if your A1C is significantly above 7.  -LOWER EXTREMITY EDEMA: You've been experiencing swelling in your feet. We will continue with your current blood pressure medication, Amlodipine, which can sometimes contribute to this swelling. If the swelling becomes too uncomfortable please let me know- we may consider changing your medication.  -HYPERLIPIDEMIA: Your cholesterol levels have been good. We will check your lipid panel to ensure they remain in a healthy range.  -HYPOTHYROIDISM: You've been feeling well on your current dose of Synthroid for your thyroid. We will check your TSH levels to ensure the dosage is still appropriate.  INSTRUCTIONS:  For general health maintenance, we will check your bone density in 1-2 years. Please continue using the estrogen cream as prescribed by your gynecologist. We also recommend a one-time Hepatitis C screening. Please schedule a follow-up appointment in 3 months.

## 2022-12-13 NOTE — Assessment & Plan Note (Signed)
Home glucose readings fluctuating between 120-275. Last A1C was 7.3, down from 7.8. Currently on Metformin 1000mg  BID and Januvia. -Check A1C and consider adding N[place]ara if A1C significantly above 7.

## 2022-12-13 NOTE — Telephone Encounter (Signed)
Pt states she misunderstood at her appt today and would like all medications except januvia sent to [facility].

## 2022-12-13 NOTE — Assessment & Plan Note (Signed)
Maintained on synthroid, continue same.

## 2022-12-13 NOTE — Progress Notes (Signed)
Subjective:     Patient ID: Nicole Douglas, female    DOB: Jul 16, 1945, 77 y.o.   MRN: [id number]  Chief Complaint  Patient presents with   Hypertension    Here for follow up   Diabetes    Here for follow up   Foot Swelling    Complains of bilateral foot swelling     Hypertension  Diabetes    Discussed the use of AI scribe software for clinical note transcription with the patient, who gave verbal consent to proceed.  History of Present Illness   The patient, with a history of hypertension, diabetes, and hyperlipidemia, presents for a routine follow-up. She reports blood pressure readings at home have been good, with a reading today of 139/79. She is currently taking amlodipine 10mg , carvedilol 12.5mg  twice daily, and losartan 100mg  daily for blood pressure management.  Her blood sugar readings at home fluctuate between 120 and 275. She is currently taking metformin 1000mg  twice daily and Januvia for diabetes management. Her last A1c was 7.3, down from 7.8.  She has been experiencing significant swelling in her feet, which she describes as "like balloons." The swelling was particularly bad during a recent two-week trip to [place]. She notes that the swelling improves at night but worsens again during the day. She also notes that her feet have turned dark after swelling and then going back down.  She is also taking Synthroid 50 for her thyroid and reports feeling okay on this dose. She is also taking omeprazole for reflux, which she reports is well-managed.          Health Maintenance Due  Topic Date Due   COVID-19 Vaccine (6 - 2023-24 season) 02/19/2022   DEXA SCAN  12/12/2022    Past Medical History:  Diagnosis Date   Hyperglycemia    Hyperlipemia    Hypertension    Thyroid disease     Past Surgical History:  Procedure Laterality Date   ABDOMINAL HYSTERECTOMY     BACK SURGERY     BIOPSY BREAST     left breast   BREAST BIOPSY Left    benign    ROTATOR  CUFF REPAIR     bilateral    Family History  Problem Relation Age of Onset   Hypertension Other        both sides of family   Heart disease Other    Cancer Brother        throat   Heart attack Mother        died at 36   Heart attack Sister        age 88   Prostate cancer Father     Social History   Socioeconomic History   Marital status: Married    Spouse name: Not on file   Number of children: Not on file   Years of education: Not on file   Highest education level: Not on file  Occupational History   Not on file  Tobacco Use   Smoking status: Never   Smokeless tobacco: Never  Substance and Sexual Activity   Alcohol use: No   Drug use: No   Sexual activity: Not on file  Other Topics Concern   Not on file  Social History Narrative   Retired from [employer]   3 grown children (oldest daughter in [place], Daughter local, Son deceased was murdered)   Married   Enjoys reading, crossword, adult coloring, sewing    No pets   Social  Determinants of Health   Financial Resource Strain: Low Risk  (03/09/2021)   Overall Financial Resource Strain (CARDIA)    Difficulty of Paying Living Expenses: Not hard at all  Food Insecurity: No Food Insecurity (03/09/2021)   Hunger Vital Sign    Worried About Running Out of Food in the Last Year: Never true    Ran Out of Food in the Last Year: Never true  Transportation Needs: No Transportation Needs (03/09/2021)   PRAPARE - Administrator, Civil Service (Medical): No    Lack of Transportation (Non-Medical): No  Physical Activity: Sufficiently Active (03/09/2021)   Exercise Vital Sign    Days of Exercise per Week: 3 days    Minutes of Exercise per Session: 60 min  Stress: No Stress Concern Present (03/09/2021)   [employer] of Occupational Health - Occupational Stress Questionnaire    Feeling of Stress : Not at all  Social Connections: Moderately Integrated (03/09/2021)   Social Connection and Isolation Panel [NHANES]     Frequency of Communication with Friends and Family: More than three times a week    Frequency of Social Gatherings with Friends and Family: Once a week    Attends Religious Services: More than 4 times per year    Active Member of [employer] or Organizations: No    Attends Banker Meetings: Never    Marital Status: Married  Catering manager Violence: Not At Risk (03/09/2021)   Humiliation, Afraid, Rape, and Kick questionnaire    Fear of Current or Ex-Partner: No    Emotionally Abused: No    Physically Abused: No    Sexually Abused: No    Outpatient Medications Prior to Visit  Medication Sig Dispense Refill   Accu-Chek Softclix Lancets lancets Use as directed to test up to 4 times daily 200 each 12   aspirin 81 MG tablet Take 81 mg by mouth daily.     blood glucose meter kit and supplies KIT Dispense based on patient and insurance preference. Use up to four times daily as directed. 1 each 0   Blood Glucose Monitoring Suppl (ACCU-CHEK GUIDE) w/Device KIT Use as directed up to 4 times daily 1 kit 0   Calcium Carbonate (CALTRATE 600 PO) Take by mouth daily.     estradiol (ESTRACE) 0.1 MG/GM vaginal cream Place vaginally.     glucose blood (ACCU-CHEK GUIDE) test strip Use as directed to test blood sugar up to 4 times daily. 200 each 12   losartan (COZAAR) 50 MG tablet TAKE 2 TABLETS BY MOUTH DAILY 200 tablet 2   meclizine (ANTIVERT) 25 MG tablet Take 1 tablet (25 mg total) by mouth 3 (three) times daily as needed for dizziness. 30 tablet 0   omeprazole (PRILOSEC) 40 MG capsule TAKE 1 CAPSULE BY MOUTH DAILY 90 capsule 3   Zoster Vaccine Adjuvanted [facility]) injection Inject 0.5 mLs into the muscle now and then again in 2-6 months. 1 each 1   amLODipine (NORVASC) 10 MG tablet TAKE 1 TABLET BY MOUTH DAILY 100 tablet 2   atorvastatin (LIPITOR) 10 MG tablet TAKE 1 TABLET BY MOUTH DAILY 100 tablet 2   carvedilol (COREG) 12.5 MG tablet TAKE 1 TABLET BY MOUTH TWICE  DAILY WITH MEALS 200  tablet 1   levothyroxine (SYNTHROID) 50 MCG tablet TAKE 1 TABLET BY MOUTH ONCE  DAILY EXCEPT TAKE ONE-HALF  TABLET BY MOUTH ON SUNDAYS 94 tablet 0   metFORMIN (GLUCOPHAGE) 1000 MG tablet TAKE 1 TABLET BY MOUTH TWICE  DAILY WITH A MEAL 200 tablet 1   sitaGLIPtin (JANUVIA) 100 MG tablet Take 1 tablet (100 mg total) by mouth daily. 30 tablet 3   famotidine (PEPCID) 20 MG tablet TAKE 1 TABLET BY MOUTH TWICE  DAILY (Patient not taking: Reported on 08/04/2022) 160 tablet 1   No facility-administered medications prior to visit.    No Known Allergies  ROS    See HPI Objective:    Physical Exam Constitutional:      General: She is not in acute distress.    Appearance: Normal appearance. She is well-developed.  HENT:     Head: Normocephalic and atraumatic.     Right Ear: External ear normal.     Left Ear: External ear normal.  Eyes:     General: No scleral icterus. Neck:     Thyroid: No thyromegaly.  Cardiovascular:     Rate and Rhythm: Normal rate and regular rhythm.     Heart sounds: Murmur (grade 2 early systolic) heard.  Pulmonary:     Effort: Pulmonary effort is normal. No respiratory distress.     Breath sounds: Normal breath sounds. No wheezing.  Musculoskeletal:     Cervical back: Neck supple.     Right lower leg: 1+ Edema present.     Left lower leg: 1+ Edema present.  Skin:    General: Skin is warm and dry.  Neurological:     Mental Status: She is alert and oriented to person, place, and time.  Psychiatric:        Mood and Affect: Mood normal.        Behavior: Behavior normal.        Thought Content: Thought content normal.        Judgment: Judgment normal.      BP 139/79 (BP Location: Right Arm, Patient Position: Sitting, Cuff Size: Small)   Pulse 89   Temp 98 F (36.7 C) (Oral)   Resp 16   Wt 163 lb (73.9 kg)   SpO2 98%   BMI 26.31 kg/m  Wt Readings from Last 3 Encounters:  12/13/22 163 lb (73.9 kg)  07/26/22 168 lb (76.2 kg)  07/08/22 166 lb 12.8 oz  (75.7 kg)       Assessment & Plan:   Problem List Items Addressed This Visit       Unprioritized   Lower extremity edema    Looks ok today. Recent exacerbation during travel. Currently on Amlodipine which can contribute to edema. Patient advised to monitor sodium intake. -Continue Amlodipine. Consider alternative antihypertensive if edema becomes intolerable.      Hypothyroidism    Maintained on synthroid, continue same.       Relevant Medications   levothyroxine (SYNTHROID) 50 MCG tablet   carvedilol (COREG) 12.5 MG tablet   Other Relevant Orders   TSH   Hyperlipidemia    Clinically stable, tolerating lipid panel 10mg  once daily.       Relevant Medications   carvedilol (COREG) 12.5 MG tablet   atorvastatin (LIPITOR) 10 MG tablet   amLODipine (NORVASC) 10 MG tablet   Other Relevant Orders   Lipid panel   HTN (hypertension)    Well controlled on current regimen of Amlodipine 10mg  daily, Carvedilol 12.5mg  BID, and Losartan 100mg  daily. -Continue current medications.       Relevant Medications   carvedilol (COREG) 12.5 MG tablet   atorvastatin (LIPITOR) 10 MG tablet   amLODipine (NORVASC) 10 MG tablet   Controlled type 2 diabetes mellitus without complication,  without long-term current use of insulin (HCC) - Primary    Home glucose readings fluctuating between 120-275. Last A1C was 7.3, down from 7.8. Currently on Metformin 1000mg  BID and Januvia. -Check A1C and consider adding [place] if A1C significantly above 7.       Relevant Medications   sitaGLIPtin (JANUVIA) 100 MG tablet   metFORMIN (GLUCOPHAGE) 1000 MG tablet   atorvastatin (LIPITOR) 10 MG tablet   Other Relevant Orders   HgB A1c   Comp Met (CMET)   Other Visit Diagnoses     Encounter for hepatitis C screening test for low risk patient       Relevant Orders   Hepatitis C Antibody       I have discontinued Dinah Beers. Nicole Douglas's famotidine. I have also changed her metFORMIN, carvedilol, atorvastatin,  and amLODipine. Additionally, I am having her maintain her aspirin, Calcium Carbonate (CALTRATE 600 PO), estradiol, Shingrix, omeprazole, blood glucose meter kit and supplies, Accu-Chek Guide, losartan, Accu-Chek Guide, Accu-Chek Softclix Lancets, meclizine, sitaGLIPtin, and levothyroxine.  Meds ordered this encounter  Medications   sitaGLIPtin (JANUVIA) 100 MG tablet    Sig: Take 1 tablet (100 mg total) by mouth daily.    Dispense:  30 tablet    Refill:  5    Order Specific Question:   Supervising Provider    Answer:   Danise Edge A [[id number]]   metFORMIN (GLUCOPHAGE) 1000 MG tablet    Sig: Take 1 tablet (1,000 mg total) by mouth 2 (two) times daily with a meal.    Dispense:  200 tablet    Refill:  1    Please send a replace/new response with 100-Day Supply if appropriate to maximize member benefit. Requesting 1 year supply.    Order Specific Question:   Supervising Provider    Answer:   Bradd Canary [[id number]]   levothyroxine (SYNTHROID) 50 MCG tablet    Sig: TAKE 1 TABLET BY MOUTH ONCE  DAILY EXCEPT TAKE ONE-HALF  TABLET BY MOUTH ON SUNDAYS    Dispense:  94 tablet    Refill:  1    Order Specific Question:   Supervising Provider    Answer:   Danise Edge A [[id number]]   carvedilol (COREG) 12.5 MG tablet    Sig: Take 1 tablet (12.5 mg total) by mouth 2 (two) times daily with a meal.    Dispense:  200 tablet    Refill:  1    Please send a replace/new response with 100-Day Supply if appropriate to maximize member benefit. Requesting 1 year supply.    Order Specific Question:   Supervising Provider    Answer:   Danise Edge A [[id number]]   atorvastatin (LIPITOR) 10 MG tablet    Sig: Take 1 tablet (10 mg total) by mouth daily.    Dispense:  100 tablet    Refill:  1    Please send a replace/new response with 100-Day Supply if appropriate to maximize member benefit. Requesting 1 year supply.    Order Specific Question:   Supervising Provider    Answer:   Danise Edge A [[id number]]   amLODipine  (NORVASC) 10 MG tablet    Sig: Take 1 tablet (10 mg total) by mouth daily.    Dispense:  100 tablet    Refill:  1    Please send a replace/new response with 100-Day Supply if appropriate to maximize member benefit. Requesting 1 year supply.    Order Specific Question:   Supervising Provider    Answer:  BLYTH, STACEY A [[id number]]

## 2022-12-13 NOTE — Assessment & Plan Note (Signed)
Looks ok today. Recent exacerbation during travel. Currently on Amlodipine which can contribute to edema. Patient advised to monitor sodium intake. -Continue Amlodipine. Consider alternative antihypertensive if edema becomes intolerable.

## 2022-12-13 NOTE — Telephone Encounter (Signed)
All [facility] except for [place]

## 2022-12-14 LAB — HEPATITIS C ANTIBODY: Hepatitis C Ab: NONREACTIVE

## 2023-01-05 ENCOUNTER — Other Ambulatory Visit: Payer: Self-pay

## 2023-01-05 ENCOUNTER — Telehealth: Payer: Self-pay | Admitting: Family

## 2023-01-05 DIAGNOSIS — K219 Gastro-esophageal reflux disease without esophagitis: Secondary | ICD-10-CM

## 2023-01-05 MED ORDER — OMEPRAZOLE 40 MG PO CPDR
40.0000 mg | DELAYED_RELEASE_CAPSULE | Freq: Every day | ORAL | 3 refills | Status: DC
Start: 2023-01-05 — End: 2023-02-08

## 2023-01-05 NOTE — Telephone Encounter (Signed)
Medication: omeprazole (PRILOSEC) 40 MG capsule   Has the patient contacted their pharmacy? Yes.     Preferred Pharmacy :   She would like to switch to a local pharmacy this time.   [id number] [place], [place] - [address] AT [facility] [address], [place] [address] Phone: [phone]  Fax: [phone]

## 2023-02-03 ENCOUNTER — Encounter (INDEPENDENT_AMBULATORY_CARE_PROVIDER_SITE_OTHER): Payer: Self-pay

## 2023-02-07 ENCOUNTER — Other Ambulatory Visit: Payer: Self-pay | Admitting: Family

## 2023-02-07 DIAGNOSIS — K219 Gastro-esophageal reflux disease without esophagitis: Secondary | ICD-10-CM

## 2023-02-10 ENCOUNTER — Encounter: Payer: Self-pay | Admitting: Family

## 2023-02-18 ENCOUNTER — Telehealth: Payer: Self-pay

## 2023-02-18 NOTE — Patient Instructions (Addendum)
Visit Information  Thank you for taking time to visit with me today. Please don't hesitate to contact me if I can be of assistance to you.   Following are the goals we discussed today:  Continue to take medications as prescribed. Continue to attend provider visits as scheduled Continue to eat healthy, lean meats, vegetables, fruits, avoid saturated and transfats Continue to check blood sugar as recommended and notify provider if questions or concerns Discuss Target Blood sugar range with your provider at next office visit.  Our next appointment is by telephone on 03/07/23 at 2:15 pm  Please call the care guide team at [phone] if you need to cancel or reschedule your appointment.   If you are experiencing a Mental Health or Behavioral Health Crisis or need someone to talk to, please call the Suicide and Crisis Lifeline: 988 call the [place] National Suicide Prevention Lifeline: 463-422-5492 or TTY: 952-850-9897 TTY (512) 694-2680) to talk to a trained counselor call 1-800-273-TALK (toll free, 24 hour hotline)  Kathyrn Sheriff, RN, MSN, BSN, CCM Care Management Coordinator [phone]

## 2023-02-18 NOTE — Patient Outreach (Signed)
  Care Coordination   Initial Visit Note   02/18/2023 Name: Nicole Douglas MRN: [id number] DOB: 08/04/1945  Nicole Douglas is a 77 y.o. year old female who sees Nicole Craze, NP for primary care. I spoke with  Nicole Douglas by phone today.  What matters to the patients health and wellness today?  Nicole Douglas reports  she is unable to afford her refill on Januvia, adding that she is in "the donut hole".  Receptive to clinical pharmacy referral.   Goals Addressed             This Visit's Progress    assist with diabetes management       Interventions Today    Flowsheet Row Most Recent Value  Chronic Disease   Chronic disease during today's visit Diabetes  General Interventions   General Interventions Discussed/Reviewed General Interventions Discussed, Communication with, Doctor Visits  Doctor Visits Discussed/Reviewed Doctor Visits Discussed, PCP  PCP/Specialist Visits Compliance with follow-up visit  [reviewed upcoming/scheduled appointments]  Communication with Pharmacists  [facility] assistance with Januvia]  Exercise Interventions   Exercise Discussed/Reviewed Physical Activity  Physical Activity Discussed/Reviewed Types of exercise  [assessed activity level. patient reports she and her husband are very active includes working in yard.]  Education Interventions   Education Provided Provided Education  Provided Verbal Education On Blood Sugar Monitoring, Other, Medication, Exercise  [encouraged to continue to check blood sugars as recommended. advised patient to discuss target blood sugar with primary provider. reviewed target blood sugar fasting 80-120,  and about 2 hours after eating BS <180.]  Pharmacy Interventions   Pharmacy Dicussed/Reviewed Pharmacy Topics Discussed, Medications and their functions, Affording Medications, Referral to Pharmacist  Referral to Pharmacist Cannot afford medications  [Januvia]            SDOH assessments and interventions completed:   Yes  SDOH Interventions Today    Flowsheet Row Most Recent Value  SDOH Interventions   Food Insecurity Interventions Intervention Not Indicated  Housing Interventions Intervention Not Indicated  Transportation Interventions Intervention Not Indicated  Utilities Interventions Intervention Not Indicated     Care Coordination Interventions:  Yes, provided   Follow up plan: Follow up call scheduled for 03/07/23    Encounter Outcome:  Pt. Visit Completed   Nicole Sheriff, RN, MSN, BSN, CCM Care Management Coordinator [phone]

## 2023-02-21 ENCOUNTER — Other Ambulatory Visit (HOSPITAL_BASED_OUTPATIENT_CLINIC_OR_DEPARTMENT_OTHER): Payer: Self-pay

## 2023-02-21 MED ORDER — PIOGLITAZONE HCL 30 MG PO TABS
30.0000 mg | ORAL_TABLET | Freq: Every day | ORAL | 5 refills | Status: DC
  Filled 2023-02-21: qty 30, 30d supply, fill #0
  Filled 2023-03-15 – 2023-03-17 (×2): qty 30, 30d supply, fill #1

## 2023-02-22 ENCOUNTER — Other Ambulatory Visit (HOSPITAL_COMMUNITY): Payer: Self-pay

## 2023-02-22 ENCOUNTER — Other Ambulatory Visit: Payer: Medicare Other | Admitting: Pharmacist

## 2023-02-22 ENCOUNTER — Telehealth: Payer: Self-pay | Admitting: Pharmacist

## 2023-02-22 NOTE — Telephone Encounter (Signed)
Unsuccessful outreach to patient regarding medication access / assistance per request below.  LM on VM with CB# [phone] or main office number [phone]    medication assistance Received: 4 days ago Colletta [place], RN  Henrene Pastor, RPH-CPP Cc: Sandford Craze, NP Hi,  I spoke with Ms. Gerstle today. She reports that she is unable to afford her refill on Januvia, adding that she is in "the donut hole". Is she able to get some assistance?   Kathyrn Sheriff, RN, MSN, BSN, CCM Care Management Coordinator [phone]

## 2023-02-22 NOTE — Progress Notes (Signed)
02/22/2023 Name: Nicole Douglas MRN: [id number] DOB: Jul 08, 1945  Chief Complaint  Patient presents with   Medication Management    Januvia cost    Nicole Douglas is a 77 y.o. year old female who presented for a telephone visit.   They were referred to the pharmacist by their Case Management Team  for assistance in managing medication access.    Subjective:  Medication Access/Adherence  Patient reports affordability concerns with their medications: Yes  - cost of Januvia now that she is in coverage gap is $150.  Patient reports access/transportation concerns to their pharmacy: No  Patient reports adherence concerns with their medications:  Yes  has been out of Januvia for 4 to 5 days  Type 2 DM:  Current diabetes therapy - Januvia 100mg  daily (has not taken in 4 or 5 days) metformin 1000mg  twice a day.  It looks like her PCP sent in a new prescription for pioglitazone 30mg  daily 02/21/2023 - likely this was to replace Januvia.   Checking blood glucose once daily . Recent readings have been - 112, 162.   Hypertension:  Patient also mentions that she has been having lightheadedness / dizzy spells when she stands. She has these episodes 2 or more times per week.  She does check her blood pressure at home sometimes. Recent blood pressure readings have been 126/67 and 112/66.  Current therapy for blood pressure:  amlodipine 10mg  daily, losartan 50mg  2 tablets = 100mg  daily, carvedilol 12.5mg  twice a day.   BP Readings from Last 3 Encounters:  12/13/22 139/79  07/26/22 133/76  07/08/22 (!) 140/80     Objective:  Lab Results  Component Value Date   HGBA1C 7.1 (H) 12/13/2022    Lab Results  Component Value Date   CREATININE 0.91 12/13/2022   BUN 14 12/13/2022   NA 141 12/13/2022   K 4.4 12/13/2022   CL 103 12/13/2022   CO2 29 12/13/2022    Lab Results  Component Value Date   CHOL 190 12/13/2022   HDL 63.70 12/13/2022   LDLCALC 94 12/13/2022   TRIG 166.0 (H)  12/13/2022   CHOLHDL 3 12/13/2022    Medications Reviewed Today     Reviewed by Henrene Pastor, RPH-CPP (Pharmacist) on 02/22/23 at 1526  Med List Status: <None>   Medication Order Taking? Sig Documenting Provider Last Dose Status Informant  Accu-Chek Softclix Lancets lancets [id number]  Use as directed to test up to 4 times daily Sandford Craze, NP  Active   amLODipine (NORVASC) 10 MG tablet [id number] Yes Take 1 tablet (10 mg total) by mouth daily. Sandford Craze, NP Taking Active   aspirin 81 MG tablet [id number]  Take 81 mg by mouth daily. [provider]  Active   atorvastatin (LIPITOR) 10 MG tablet [id number]  Take 1 tablet (10 mg total) by mouth daily. Sandford Craze, NP  Active   blood glucose meter kit and supplies KIT [id number]  Dispense based on patient and insurance preference. Use up to four times daily as directed. Sandford Craze, NP  Active   Blood Glucose Monitoring Suppl (ACCU-CHEK GUIDE) w/Device KIT [id number]  Use as directed up to 4 times daily Sandford Craze, NP  Active   Calcium Carbonate (CALTRATE 600 PO) [id number]  Take by mouth daily. [provider]  Active   carvedilol (COREG) 12.5 MG tablet [id number] Yes Take 1 tablet (12.5 mg total) by mouth 2 (two) times daily with a meal. Sandford Craze, NP Taking Active   estradiol (ESTRACE)  0.1 MG/GM vaginal cream [id number]  Place vaginally. [provider]  Active   glucose blood (ACCU-CHEK GUIDE) test strip [id number]  Use as directed to test blood sugar up to 4 times daily. Sandford Craze, NP  Active   levothyroxine (SYNTHROID) 50 MCG tablet [id number] Yes TAKE 1 TABLET BY MOUTH ONCE  DAILY EXCEPT TAKE ONE-HALF  TABLET BY MOUTH ON Meridee Score, NP Taking Active   losartan (COZAAR) 50 MG tablet [id number] Yes TAKE 2 TABLETS BY MOUTH DAILY Sandford Craze, NP Taking Active   meclizine (ANTIVERT) 25 MG tablet [id number] Yes Take 1 tablet (25 mg total) by mouth  3 (three) times daily as needed for dizziness. Sandford Craze, NP Taking Active   metFORMIN (GLUCOPHAGE) 1000 MG tablet [id number] Yes Take 1 tablet (1,000 mg total) by mouth 2 (two) times daily with a meal. Sandford Craze, NP Taking Active   omeprazole (PRILOSEC) 40 MG capsule [id number] Yes Take 1 capsule (40 mg total) by mouth daily. Sandford Craze, NP Taking Active   pioglitazone (ACTOS) 30 MG tablet [id number] No Take 1 tablet (30 mg total) by mouth daily.  Patient not taking: Reported on 02/22/2023   Sandford Craze, NP Not Taking Active   sitaGLIPtin (JANUVIA) 100 MG tablet [id number] No Take 1 tablet (100 mg total) by mouth daily.  Patient not taking: Reported on 02/22/2023   Sandford Craze, NP Not Taking Active               Assessment/Plan:   Diabetes:Looks like PCP has prescribed pioglitazone to replace Januvia - Reviewed goal A1c, goal fasting, and goal 2 hour post prandial glucose - Will consult with PCP regarding pioglitazone and Januvia. We can try to apply for medication assistance program for Januvia or GLP1 if she would like or would prefer over pioglitazone. Patient could take pioglitazone until she receives patient assistance program medications.  - Continue to take metformin at current dose.  - Recommend to check glucose daily    Hypertension:Last office blood pressure was at goal but she has lower readings at home and has been having dizziness when she stands.  - Recommend she continue to check blood pressure daily and record.  - Reminded her to stand slowly to avoid falls.  - Will let PCP know about her dizziness. May need office visit to check orthostatic blood pressure.    Follow Up Plan: 1 to 2 weeks to check home blood glucose and assess for edema with addition of pioglitazone.   Henrene Pastor, PharmD Clinical Pharmacist [facility]

## 2023-02-24 ENCOUNTER — Other Ambulatory Visit: Payer: Self-pay | Admitting: Family

## 2023-02-25 ENCOUNTER — Telehealth: Payer: Self-pay | Admitting: Family

## 2023-02-25 NOTE — Telephone Encounter (Signed)
Nicole Douglas from [facility] called & stated that they need permission form [place] to change the manufacture for the medication levothyroxine (SYNTHROID) 50 MCG tablet. Please call & advise.

## 2023-02-25 NOTE — Telephone Encounter (Signed)
Spoke to [place] at [facility] to let him per Sandford Craze it was alright to change manufacturer

## 2023-03-04 ENCOUNTER — Telehealth: Payer: Self-pay | Admitting: Pharmacist

## 2023-03-04 NOTE — Telephone Encounter (Signed)
I was not able to reach patient. DPR form 09/2013 allows for messages to be left on VM.  Left VM instructing patient to continue pioglitazone. Also our med assist team will mail her an application for Januvia. Will follow up with patient in 2 to 4 weeks to see if any questions and check blood glucose and assess for edema.

## 2023-03-04 NOTE — Telephone Encounter (Signed)
-----   Message from Lemont Fillers sent at 02/25/2023  2:03 PM EDT ----- Yes, stop Nicole Douglas, start actos.  Please see if she can get patient assistance for januvia and hopefully we can eventually switch her back. Also, I see her on 9/24 so I can address her bp at that time. Thank you! ----- Message ----- From: Henrene Pastor, RPH-CPP Sent: 02/24/2023   4:18 PM EDT To: Sandford Craze, NP  Hi Nicole Douglas, I just wanted to make sure your plan was to stop Januvia 100mg  and replace with pioglitazone. If you would prefer I think she would qualify for Januvia or a GLP1 patient assistance program. She would take pioglitazone until approved and then switch. Just let me know your preference.   She also mentioned dizziness when standing several days a week. Her home blood pressure have been 126/67 and 112/66 but office blood pressure just barely < 140/90. Did not know if you would like appt to check orthostatic blood pressure or adjust meds?

## 2023-03-04 NOTE — Telephone Encounter (Signed)
See phone visit from 02/22/23

## 2023-03-07 ENCOUNTER — Ambulatory Visit: Payer: Self-pay

## 2023-03-07 ENCOUNTER — Telehealth: Payer: Self-pay

## 2023-03-07 NOTE — Patient Instructions (Signed)
Visit Information  Thank you for taking time to visit with me today. Please don't hesitate to contact me if I can be of assistance to you.   Following are the goals we discussed today:  Continue to take medications as prescribed. Continue to attend provider visits as scheduled Continue to eat healthy, lean meats, vegetables, fruits, avoid saturated and transfats Continue to check blood sugar as recommended and notify provider if questions or concerns Continue to check blood pressure routinely and contact provider if questions or concerns  Our next appointment is by telephone on 04/06/23 at 2:00 pm  Please call the care guide team at [phone] if you need to cancel or reschedule your appointment.   If you are experiencing a Mental Health or Behavioral Health Crisis or need someone to talk to, please call the Suicide and Crisis Lifeline: 988 call the [place] National Suicide Prevention Lifeline: 217-611-1074 or TTY: (626)491-1238 TTY 856-113-9986) to talk to a trained counselor call 1-800-273-TALK (toll free, 24 hour hotline)  Kathyrn Sheriff, RN, MSN, BSN, CCM Care Management Coordinator [phone]

## 2023-03-07 NOTE — Patient Outreach (Signed)
Care Coordination   03/07/2023 Name: Nicole Douglas MRN: [id number] DOB: Nov 14, 1945   Care Coordination Outreach Attempts:  An unsuccessful telephone outreach was attempted for a scheduled appointment today.  Follow Up Plan:  Additional outreach attempts will be made to offer the patient care coordination information and services.   Encounter Outcome:  No Answer   Care Coordination Interventions:  No, not indicated    Kathyrn Sheriff, RN, MSN, BSN, CCM Care Management Coordinator [phone]

## 2023-03-07 NOTE — Patient Outreach (Signed)
Care Coordination   Follow Up Visit Note   03/07/2023 Name: Nicole Douglas MRN: [id number] DOB: 02/10/1946  Nicole Douglas is a 77 y.o. year old female who sees Sandford Craze, NP for primary care. I spoke with  Nicole Douglas by phone today.  What matters to the patients health and wellness today?  Received return call from patient. Ms. Brouse states she has medications prescribed per primary provider. She reports she is in the process of applying for Assistance for Januvia. Patient reports she continues to have some dizziness every now and then, but states it is not as frequent-reports BP 126-127/85-86. She is without questions or concerns at this time. Upcoming appointment with Primary provider 03/15/23.  Goals Addressed             This Visit's Progress    assist with diabetes management       Interventions Today    Flowsheet Row Most Recent Value  Chronic Disease   Chronic disease during today's visit Diabetes, Hypertension (HTN)  General Interventions   Doctor Visits Discussed/Reviewed Doctor Visits Discussed, PCP  PCP/Specialist Visits Compliance with follow-up visit  [Reviewed upcoming scheduled appointments, advised to attend as scheduled]  Education Interventions   Education Provided Provided Education  Provided Verbal Education On Other, Medication, When to see the doctor  [advised to take medications as prescribed, attend provider visits as scheduled, continue to check BS as recommended, contact provider with any health questions or concerns.]  Pharmacy Interventions   Pharmacy Dicussed/Reviewed Pharmacy Topics Reviewed  [confirmed patient has pioglitozone-prescribed by PCP, and is taking. Reiterated treatment plan per clinical pharmacist and PCP for managing BS. patient to take pioglitazone until able to obtain assistance for Januvia.]  Safety Interventions   Safety Discussed/Reviewed Fall Risk, Safety Discussed  [discussed fall prevention: change prosition slowly, keep  walkways clear, have good lighting. encouraged to continue to take BP and contact provider with questions or concerns. Advised to take recorded BP readings to next provider visit.]            SDOH assessments and interventions completed:  Yes  Care Coordination Interventions:  Yes, provided   Follow up plan: Follow up call scheduled for 04/06/23    Encounter Outcome:  Patient Visit Completed   Kathyrn Sheriff, RN, MSN, BSN, CCM Care Management Coordinator [phone]

## 2023-03-11 ENCOUNTER — Other Ambulatory Visit (HOSPITAL_COMMUNITY): Payer: Self-pay

## 2023-03-14 NOTE — Telephone Encounter (Signed)
Will ship in 7-10 business days.  3 questions/request: Reason for hardship. If she has Part D or Part B (can have both). Sign, date, and return (in return envelope should take 7-10 days for [employer] to receive - pt can send back by ceritifed mail for them to receive sooner).  If she has questions, please call [employer] at [phone]

## 2023-03-15 ENCOUNTER — Telehealth: Payer: Self-pay | Admitting: Family

## 2023-03-15 ENCOUNTER — Other Ambulatory Visit (HOSPITAL_BASED_OUTPATIENT_CLINIC_OR_DEPARTMENT_OTHER): Payer: Self-pay

## 2023-03-15 ENCOUNTER — Ambulatory Visit (INDEPENDENT_AMBULATORY_CARE_PROVIDER_SITE_OTHER): Payer: Medicare Other | Admitting: Family

## 2023-03-15 VITALS — BP 128/61 | HR 68 | Temp 98.5°F | Resp 16 | Wt 163.0 lb

## 2023-03-15 DIAGNOSIS — E785 Hyperlipidemia, unspecified: Secondary | ICD-10-CM

## 2023-03-15 DIAGNOSIS — K219 Gastro-esophageal reflux disease without esophagitis: Secondary | ICD-10-CM

## 2023-03-15 DIAGNOSIS — H8110 Benign paroxysmal vertigo, unspecified ear: Secondary | ICD-10-CM

## 2023-03-15 DIAGNOSIS — E038 Other specified hypothyroidism: Secondary | ICD-10-CM

## 2023-03-15 DIAGNOSIS — E119 Type 2 diabetes mellitus without complications: Secondary | ICD-10-CM

## 2023-03-15 DIAGNOSIS — Z23 Encounter for immunization: Secondary | ICD-10-CM | POA: Diagnosis not present

## 2023-03-15 DIAGNOSIS — I10 Essential (primary) hypertension: Secondary | ICD-10-CM | POA: Diagnosis not present

## 2023-03-15 LAB — COMPREHENSIVE METABOLIC PANEL
ALT: 14 U/L (ref 0–35)
AST: 14 U/L (ref 0–37)
Albumin: 4 g/dL (ref 3.5–5.2)
Alkaline Phosphatase: 69 U/L (ref 39–117)
BUN: 13 mg/dL (ref 6–23)
CO2: 30 mEq/L (ref 19–32)
Calcium: 9.4 mg/dL (ref 8.4–10.5)
Chloride: 103 mEq/L (ref 96–112)
Creatinine, Ser: 0.78 mg/dL (ref 0.40–1.20)
GFR: 72.63 mL/min (ref >60.00)
Glucose, Bld: 189 mg/dL — ABNORMAL HIGH (ref 70–99)
Potassium: 4 mEq/L (ref 3.5–5.1)
Sodium: 140 mEq/L (ref 135–145)
Total Bilirubin: 1.1 mg/dL (ref 0.2–1.2)
Total Protein: 6.6 g/dL (ref 6.0–8.3)

## 2023-03-15 LAB — HEMOGLOBIN A1C: Hgb A1c MFr Bld: 7.1 % — ABNORMAL HIGH (ref 4.6–6.5)

## 2023-03-15 LAB — MICROALBUMIN / CREATININE URINE RATIO
Creatinine,U: 98.3 mg/dL
Microalb Creat Ratio: 0.7 mg/g (ref 0.0–30.0)
Microalb, Ur: <0.7 mg/dL (ref 0.0–1.9)

## 2023-03-15 NOTE — Assessment & Plan Note (Signed)
Lab Results  Component Value Date   CHOL 190 12/13/2022   HDL 63.70 12/13/2022   LDLCALC 94 12/13/2022   TRIG 166.0 (H) 12/13/2022   CHOLHDL 3 12/13/2022   LDL at goal on lipitor 10mg , continue same.

## 2023-03-15 NOTE — Progress Notes (Signed)
Subjective:     Patient ID: Nicole Douglas, female    DOB: [birth year], 77 y.o.   MRN: [id number]  Chief Complaint  Patient presents with   Diabetes    Here for follow up   Hypertension    Here for follow up    Diabetes  Hypertension    Discussed the use of AI scribe software for clinical note transcription with the patient, who gave verbal consent to proceed.  History of Present Illness   The patient, with a history of diabetes, vertigo, hypertension, hyperlipidemia, hypothyroidism, and gastroesophageal reflux disease, presents for a routine follow-up. She reports variable blood glucose levels, but denies hypoglycemic symptoms. She has been adherent to her metformin and pioglitazone regimen, but discontinued Januvia due to cost. She has occasional vertigo, which is managed with as-needed medication. She also reports right shoulder pain, which is localized to the upper bone. She has been experiencing some visual changes, which she describes as a feeling of weakness in both eyes. She had a cataract removed from her left eye and has been recommended to have the same procedure on her right eye, but has been hesitant due to concerns about potential complications.          Health Maintenance Due  Topic Date Due   DEXA SCAN  12/12/2022   OPHTHALMOLOGY EXAM  12/31/2022   COVID-19 Vaccine (6 - 2023-24 season) 02/20/2023   Medicare Annual Wellness (AWV)  03/12/2023   Diabetic kidney evaluation - Urine ACR  03/18/2023    Past Medical History:  Diagnosis Date   Hyperglycemia    Hyperlipemia    Hypertension    Thyroid disease     Past Surgical History:  Procedure Laterality Date   ABDOMINAL HYSTERECTOMY     BACK SURGERY     BIOPSY BREAST     left breast   BREAST BIOPSY Left    benign    ROTATOR CUFF REPAIR     bilateral    Family History  Problem Relation Age of Onset   Hypertension Other        both sides of family   Heart disease Other    Cancer Brother         throat   Heart attack Mother        died at 59   Heart attack Sister        age 39   Prostate cancer Father     Social History   Socioeconomic History   Marital status: Married    Spouse name: Not on file   Number of children: Not on file   Years of education: Not on file   Highest education level: Not on file  Occupational History   Not on file  Tobacco Use   Smoking status: Never   Smokeless tobacco: Never  Substance and Sexual Activity   Alcohol use: No   Drug use: No   Sexual activity: Not on file  Other Topics Concern   Not on file  Social History Narrative   Retired from [employer]   3 grown children (oldest daughter in [place], Daughter local, Son deceased was murdered)   Married   Enjoys reading, crossword, adult coloring, sewing    No pets   Social Determinants of Health   Financial Resource Strain: Low Risk  (03/09/2021)   Overall Financial Resource Strain (CARDIA)    Difficulty of Paying Living Expenses: Not hard at all  Food Insecurity: No Food Insecurity (02/18/2023)  Hunger Vital Sign    Worried About Running Out of Food in the Last Year: Never true    Ran Out of Food in the Last Year: Never true  Transportation Needs: No Transportation Needs (02/18/2023)   PRAPARE - Administrator, Civil Service (Medical): No    Lack of Transportation (Non-Medical): No  Physical Activity: Sufficiently Active (03/09/2021)   Exercise Vital Sign    Days of Exercise per Week: 3 days    Minutes of Exercise per Session: 60 min  Stress: No Stress Concern Present (03/09/2021)   [employer] of Occupational Health - Occupational Stress Questionnaire    Feeling of Stress : Not at all  Social Connections: Moderately Integrated (03/09/2021)   Social Connection and Isolation Panel [NHANES]    Frequency of Communication with Friends and Family: More than three times a week    Frequency of Social Gatherings with Friends and Family: Once a week    Attends Religious  Services: More than 4 times per year    Active Member of [employer] or Organizations: No    Attends Banker Meetings: Never    Marital Status: Married  Catering manager Violence: Not At Risk (03/09/2021)   Humiliation, Afraid, Rape, and Kick questionnaire    Fear of Current or Ex-Partner: No    Emotionally Abused: No    Physically Abused: No    Sexually Abused: No    Outpatient Medications Prior to Visit  Medication Sig Dispense Refill   Accu-Chek Softclix Lancets lancets Use as directed to test up to 4 times daily 200 each 12   amLODipine (NORVASC) 10 MG tablet Take 1 tablet (10 mg total) by mouth daily. 100 tablet 1   aspirin 81 MG tablet Take 81 mg by mouth daily.     atorvastatin (LIPITOR) 10 MG tablet Take 1 tablet (10 mg total) by mouth daily. 100 tablet 1   blood glucose meter kit and supplies KIT Dispense based on patient and insurance preference. Use up to four times daily as directed. 1 each 0   Blood Glucose Monitoring Suppl (ACCU-CHEK GUIDE) w/Device KIT Use as directed up to 4 times daily 1 kit 0   Calcium Carbonate (CALTRATE 600 PO) Take by mouth daily.     carvedilol (COREG) 12.5 MG tablet Take 1 tablet (12.5 mg total) by mouth 2 (two) times daily with a meal. 200 tablet 1   estradiol (ESTRACE) 0.1 MG/GM vaginal cream Place vaginally.     glucose blood (ACCU-CHEK GUIDE) test strip Use as directed to test blood sugar up to 4 times daily. 200 each 12   levothyroxine (SYNTHROID) 50 MCG tablet TAKE 1 TABLET BY MOUTH ONCE  DAILY EXCEPT TAKE ONE-HALF  TABLET BY MOUTH ON SUNDAYS 94 tablet 2   losartan (COZAAR) 50 MG tablet TAKE 2 TABLETS BY MOUTH DAILY 200 tablet 2   meclizine (ANTIVERT) 25 MG tablet TAKE 1 TABLET BY MOUTH 3 TIMES  DAILY AS NEEDED FOR DIZZINESS 30 tablet 0   metFORMIN (GLUCOPHAGE) 1000 MG tablet Take 1 tablet (1,000 mg total) by mouth 2 (two) times daily with a meal. 200 tablet 1   omeprazole (PRILOSEC) 40 MG capsule Take 1 capsule (40 mg total) by mouth  daily. 90 capsule 1   pioglitazone (ACTOS) 30 MG tablet Take 1 tablet (30 mg total) by mouth daily. 30 tablet 5   sitaGLIPtin (JANUVIA) 100 MG tablet Take 1 tablet (100 mg total) by mouth daily. (Patient not taking: Reported on 03/15/2023)  30 tablet 5   No facility-administered medications prior to visit.    No Known Allergies  ROS See HPI    Objective:    Physical Exam Constitutional:      General: She is not in acute distress.    Appearance: Normal appearance. She is well-developed.  HENT:     Head: Normocephalic and atraumatic.     Right Ear: External ear normal.     Left Ear: External ear normal.  Eyes:     General: No scleral icterus. Neck:     Thyroid: No thyromegaly.  Cardiovascular:     Rate and Rhythm: Normal rate and regular rhythm.     Heart sounds: Normal heart sounds. No murmur heard. Pulmonary:     Effort: Pulmonary effort is normal. No respiratory distress.     Breath sounds: Normal breath sounds. No wheezing.  Musculoskeletal:     Cervical back: Neck supple.  Skin:    General: Skin is warm and dry.  Neurological:     Mental Status: She is alert and oriented to person, place, and time.  Psychiatric:        Mood and Affect: Mood normal.        Behavior: Behavior normal.        Thought Content: Thought content normal.        Judgment: Judgment normal.      BP 128/61 (BP Location: Right Arm, Patient Position: Sitting, Cuff Size: Small)   Pulse 68   Temp 98.5 F (36.9 C) (Oral)   Resp 16   Wt 163 lb (73.9 kg)   SpO2 98%   BMI 26.31 kg/m  Wt Readings from Last 3 Encounters:  03/15/23 163 lb (73.9 kg)  12/13/22 163 lb (73.9 kg)  07/26/22 168 lb (76.2 kg)       Assessment & Plan:   Problem List Items Addressed This Visit       Unprioritized   Hypothyroidism    Lab Results  Component Value Date   TSH 1.99 12/13/2022   Stable, continue synthroid.       Hyperlipidemia    Lab Results  Component Value Date   CHOL 190 12/13/2022   HDL  63.70 12/13/2022   LDLCALC 94 12/13/2022   TRIG 166.0 (H) 12/13/2022   CHOLHDL 3 12/13/2022   LDL at goal on lipitor 10mg , continue same.       HTN (hypertension)    BP Readings from Last 3 Encounters:  03/15/23 128/61  12/13/22 139/79  07/26/22 133/76   At goal, continue amlodipine, carvedilol and losartan.       GERD (gastroesophageal reflux disease)    Stable on omeprazole, continue same.       Controlled type 2 diabetes mellitus without complication, without long-term current use of insulin (HCC) - Primary    Lab Results  Component Value Date   HGBA1C 7.1 (H) 12/13/2022   HGBA1C 7.3 (H) 06/22/2022   HGBA1C 7.9 (H) 03/17/2022   Lab Results  Component Value Date   MICROALBUR <0.7 03/17/2022   LDLCALC 94 12/13/2022   CREATININE 0.91 12/13/2022         Relevant Orders   HgB A1c   Comp Met (CMET)   Urine Microalbumin w/creat. ratio   Benign paroxysmal positional vertigo    Rare symptoms. Uses meclizine occasionally.       Other Visit Diagnoses     Needs flu shot       Relevant Orders   Flu Vaccine Trivalent High  Dose (Fluad) (Completed)       I have discontinued Dinah Beers. [employer]'s sitaGLIPtin. I am also having her maintain her aspirin, Calcium Carbonate (CALTRATE 600 PO), estradiol, Accu-Chek Guide, losartan, Accu-Chek Guide, Accu-Chek Softclix Lancets, amLODipine, atorvastatin, blood glucose meter kit and supplies, carvedilol, metFORMIN, omeprazole, pioglitazone, levothyroxine, and meclizine.  No orders of the defined types were placed in this encounter.

## 2023-03-15 NOTE — Assessment & Plan Note (Signed)
BP Readings from Last 3 Encounters:  03/15/23 128/61  12/13/22 139/79  07/26/22 133/76   At goal, continue amlodipine, carvedilol and losartan.

## 2023-03-15 NOTE — Assessment & Plan Note (Signed)
Lab Results  Component Value Date   HGBA1C 7.1 (H) 12/13/2022   HGBA1C 7.3 (H) 06/22/2022   HGBA1C 7.9 (H) 03/17/2022   Lab Results  Component Value Date   MICROALBUR <0.7 03/17/2022   LDLCALC 94 12/13/2022   CREATININE 0.91 12/13/2022

## 2023-03-15 NOTE — Assessment & Plan Note (Signed)
Rare symptoms. Uses meclizine occasionally.

## 2023-03-15 NOTE — Assessment & Plan Note (Signed)
Stable on omeprazole, continue same.

## 2023-03-15 NOTE — Telephone Encounter (Signed)
Copied from CRM [id number]. Topic: Medicare AWV >> Mar 15, 2023  3:25 PM Payton Doughty wrote: Reason for CRM: LM 03/15/2023 to schedule AWV   Verlee Rossetti; Care Guide Ambulatory Clinical Support [facility] Direct Dial: [phone]

## 2023-03-15 NOTE — Patient Instructions (Signed)
VISIT SUMMARY:  During your recent visit, we discussed your ongoing management of diabetes, vertigo, hypertension, hyperlipidemia, hypothyroidism, and gastroesophageal reflux disease. You also mentioned new shoulder pain and changes in your vision. We reviewed your medications and made some recommendations for changes.  YOUR PLAN:  -TYPE 2 DIABETES MELLITUS: This is a condition where your body doesn't use insulin properly, leading to high blood sugar levels. We will continue your current medications, Metformin and Pioglitazone, and consider cost-effective alternatives to Januvia if your A1c remains above target.  -VERTIGO: This is a sensation of feeling off balance, often described as a spinning sensation. We will continue your as-needed medication for vertigo.  -HYPERTENSION: This is a condition where the force of the blood against the artery walls is too high. Your condition is well-controlled on Amlodipine, Carvedilol, and Losartan, so we will continue this regimen.  -HYPERLIPIDEMIA: This is a condition where there are high levels of fats (lipids) in the blood. Your cholesterol is within target on Lipitor, so we will continue this medication.  -HYPOTHYROIDISM: This is a condition where your thyroid gland doesn't produce enough thyroid hormones. Your thyroid function is normal on Synthroid, so we will continue this medication.  -GASTROESOPHAGEAL REFLUX DISEASE: This is a chronic disease where stomach acid frequently flows back into the tube connecting your mouth and stomach (esophagus). Your symptoms are controlled on Emepazole, so we will continue this medication.  -RIGHT SHOULDER PAIN: You've reported new pain in your right shoulder. recommend tylenol as needed. If this pain persists, we may need to evaluate it further.  -CATARACT: This is a clouding of the normally clear lens of your eye. We recommend proceeding with the cataract surgery on your right eye for improved  vision.  INSTRUCTIONS:  Today, we administered the influenza vaccine. We also encourage you to receive the COVID-19 booster shot at your local pharmacy. Please schedule an eye exam with Dr. Logan Bores. We will check your blood glucose control with an A1c test today. We will follow up in 3 months.

## 2023-03-15 NOTE — Assessment & Plan Note (Signed)
Lab Results  Component Value Date   TSH 1.99 12/13/2022   Stable, continue synthroid.

## 2023-03-29 ENCOUNTER — Other Ambulatory Visit: Payer: Medicare Other | Admitting: Pharmacist

## 2023-03-29 NOTE — Progress Notes (Signed)
03/29/2023 Name: Nicole Douglas MRN: [id number] DOB: 04/16/1946  Chief Complaint  Patient presents with   Medication Management    Nicole Douglas is a 77 y.o. year old female who presented for a telephone visit.   They were referred to the pharmacist by their Case Management Team  for assistance in managing medication access.    Subjective:  Medication Access/Adherence  Patient reports affordability concerns with their medications: Yes  - cost of Januvia now that she is in coverage gap is $150.  Patient reports access/transportation concerns to their pharmacy: No  Patient reports adherence concerns with their medications:  Yes  has been out of Januvia for 4 to 5 days  Type 2 DM:  Current diabetes therapy - metformin 1000mg  twice a day; pioglitazone 30mg  daily (started until she could get Januvia)  Has not taken Januvia 100mg  in about 2 weeks.    Checking blood glucose once daily . Recent readings have been 90 to 160.   Hypertension:   Current therapy for blood pressure:  amlodipine 10mg  daily, losartan 50mg  2 tablets = 100mg  daily, carvedilol 12.5mg  twice a day.  Losartan last refilled 01/10/2023 for 100 day supply   She does check her blood pressure at home sometimes. Recent blood pressure readings have been 112 to 128 / 65 to 75    BP Readings from Last 3 Encounters:  03/15/23 128/61  12/13/22 139/79  07/26/22 133/76     Objective:  Lab Results  Component Value Date   HGBA1C 7.1 (H) 03/15/2023    Lab Results  Component Value Date   CREATININE 0.78 03/15/2023   BUN 13 03/15/2023   NA 140 03/15/2023   K 4.0 03/15/2023   CL 103 03/15/2023   CO2 30 03/15/2023    Lab Results  Component Value Date   CHOL 190 12/13/2022   HDL 63.70 12/13/2022   LDLCALC 94 12/13/2022   TRIG 166.0 (H) 12/13/2022   CHOLHDL 3 12/13/2022    Medications Reviewed Today     Reviewed by Henrene Pastor, RPH-CPP (Pharmacist) on 03/29/23 at 1454  Med List Status: <None>    Medication Order Taking? Sig Documenting Provider Last Dose Status Informant  Accu-Chek Softclix Lancets lancets [id number] Yes Use as directed to test up to 4 times daily Sandford Craze, NP Taking Active   amLODipine (NORVASC) 10 MG tablet [id number] Yes Take 1 tablet (10 mg total) by mouth daily. Sandford Craze, NP Taking Active   aspirin 81 MG tablet [id number] Yes Take 81 mg by mouth daily. [provider] Taking Active   atorvastatin (LIPITOR) 10 MG tablet [id number] Yes Take 1 tablet (10 mg total) by mouth daily. Sandford Craze, NP Taking Active   blood glucose meter kit and supplies KIT [id number] Yes Dispense based on patient and insurance preference. Use up to four times daily as directed. Sandford Craze, NP Taking Active   Blood Glucose Monitoring Suppl (ACCU-CHEK GUIDE) w/Device KIT [id number] Yes Use as directed up to 4 times daily Sandford Craze, NP Taking Active   Calcium Carbonate (CALTRATE 600 PO) [id number] Yes Take by mouth daily. [provider] Taking Active   carvedilol (COREG) 12.5 MG tablet [id number] Yes Take 1 tablet (12.5 mg total) by mouth 2 (two) times daily with a meal. Sandford Craze, NP Taking Active   estradiol (ESTRACE) 0.1 MG/GM vaginal cream [id number]  Place vaginally. [provider]  Active   glucose blood (ACCU-CHEK GUIDE) test strip [id number] Yes Use as directed to test blood sugar  up to 4 times daily. Sandford Craze, NP Taking Active   levothyroxine (SYNTHROID) 50 MCG tablet [id number] Yes TAKE 1 TABLET BY MOUTH ONCE  DAILY EXCEPT TAKE ONE-HALF  TABLET BY MOUTH ON Meridee Score, NP Taking Active   losartan (COZAAR) 50 MG tablet [id number] Yes TAKE 2 TABLETS BY MOUTH DAILY Sandford Craze, NP Taking Active   meclizine (ANTIVERT) 25 MG tablet [id number] Yes TAKE 1 TABLET BY MOUTH 3 TIMES  DAILY AS NEEDED FOR Ebony Hail, NP Taking Active   metFORMIN (GLUCOPHAGE) 1000 MG tablet  [id number] Yes Take 1 tablet (1,000 mg total) by mouth 2 (two) times daily with a meal. Sandford Craze, NP Taking Active   omeprazole (PRILOSEC) 40 MG capsule [id number] Yes Take 1 capsule (40 mg total) by mouth daily. Sandford Craze, NP Taking Active   pioglitazone (ACTOS) 30 MG tablet [id number]  Take 1 tablet (30 mg total) by mouth daily. Henrene Pastor, RPH-CPP  Active   sitaGLIPtin (JANUVIA) 100 MG tablet [id number] Yes Take 1 tablet (100 mg total) by mouth daily. Henrene Pastor, RPH-CPP  Active               Assessment/Plan:   Diabetes: Home blood glucose readings slightly higher since started pioglitazone compared to Januvia.  - Reviewed goal A1c, goal fasting, and goal 2 hour post prandial glucose - Patient was approved to received 90 DS of Januvia from [employer] medication assistance program. She should received around 04/12/2023. She will complete current fill of pioglitazone and then switch back to Januvia 100mg  daily. Plan to restart application for 2025 for Januvia in 1 to 2 months.   - Continue to take metformin at current dose.  - Recommend to check glucose daily    Hypertension:Last office blood pressure was at goal   - Recommend she continue to check blood pressure daily and record.  - continue losartan and almodipine   Follow Up Plan: 1 to 2 months to check home blood glucose and medication adherence.    Henrene Pastor, PharmD Clinical Pharmacist [facility]

## 2023-04-06 ENCOUNTER — Telehealth: Payer: Self-pay

## 2023-04-06 ENCOUNTER — Ambulatory Visit: Payer: Self-pay

## 2023-04-06 NOTE — Patient Outreach (Signed)
  Care Coordination   Follow Up Visit Note   04/06/2023 Name: Nicole Douglas MRN: [id number] DOB: [birth year]  Nicole Douglas is a 77 y.o. year old female who sees Sandford Craze, NP for primary care. I spoke with  Nicole Douglas by phone today.  What matters to the patients health and wellness today?  Nicole Douglas reports she is doing well. Last office visit with primary provider completed on 03/15/23. Patient is also being followed by clinical pharmacist regarding diabetes management/medications. Patient is interested in addition information of diabetes and nutrition. Nicole Douglas reports she has planned surgery for cataract removal  with ophthalmology Jan. 2025.  Goals Addressed             This Visit's Progress    assist with diabetes management       Interventions Today    Flowsheet Row Most Recent Value  Chronic Disease   Chronic disease during today's visit Diabetes, Hypertension (HTN)  General Interventions   General Interventions Discussed/Reviewed General Interventions Reviewed, Doctor Visits  [Evaluation of current treatment plan for health condition and patient's adherence to plan.]  Doctor Visits Discussed/Reviewed Doctor Visits Reviewed, Doctor Visits Discussed, PCP  PCP/Specialist Visits Compliance with follow-up visit  [reveiwed upcoming scheduled appointments]  Education Interventions   Education Provided Provided Education, Provided Web-based Education, Provided Printed Education  [Emmi video and mail: Diabetes type 2,  diabetes nutrition and healthy eating,  diabetes and diet,  the abcs of diabetes,  carb counting for adults with diabetes]  Provided Verbal Education On Blood Sugar Monitoring, Labs, Medication, When to see the doctor, Other  [patient instruction per provider office visit 03/15/23. discussed target blood sugar range of 80-130 ac and 180 or less- 2 hours after eating or per provider recommendation.]  Nutrition Interventions   Nutrition Discussed/Reviewed  Nutrition Discussed, Portion sizes, Adding fruits and vegetables  Pharmacy Interventions   Pharmacy Dicussed/Reviewed Pharmacy Topics Discussed  [medication reviewe completed]            SDOH assessments and interventions completed:  No  Care Coordination Interventions:  Yes, provided   Follow up plan: Follow up call scheduled for 05/09/23    Encounter Outcome:  Patient Visit Completed   Kathyrn Sheriff, RN, MSN, BSN, CCM Care Management Coordinator [phone]

## 2023-04-06 NOTE — Patient Instructions (Signed)
Visit Information  Thank you for taking time to visit with me today. Please don't hesitate to contact me if I can be of assistance to you.   Following are the goals we discussed today:  Continue to take medications as prescribed. Continue to attend provider visits as scheduled Continue to eat healthy, lean meats, vegetables, fruits, avoid saturated and transfats Continue to check blood sugar as recommended and notify provider if questions or concerns Continue to check blood pressure routinely and contact provider if questions or concerns   Our next appointment is by telephone on 05/09/23 at 2:00 pm  Please call the care guide team at [phone] if you need to cancel or reschedule your appointment.   If you are experiencing a Mental Health or Behavioral Health Crisis or need someone to talk to, please call the Suicide and Crisis Lifeline: 988 call the [place] National Suicide Prevention Lifeline: 505-696-7887 or TTY: 404-335-9084 TTY 380-293-8074) to talk to a trained counselor call 1-800-273-TALK (toll free, 24 hour hotline)  Kathyrn Sheriff, RN, MSN, BSN, CCM Care Management Coordinator [phone]

## 2023-04-06 NOTE — Patient Outreach (Signed)
  Care Coordination   04/06/2023 Name: LIZETH BENCOSME MRN: [id number] DOB: 1946-01-12   Care Coordination Outreach Attempts:  An unsuccessful telephone outreach was attempted for a scheduled appointment today.  Follow Up Plan:  Additional outreach attempts will be made to offer the patient care coordination information and services.   Encounter Outcome:  No Answer   Care Coordination Interventions:  No, not indicated    Kathyrn Sheriff, RN, MSN, BSN, CCM Care Management Coordinator [phone]

## 2023-05-03 ENCOUNTER — Other Ambulatory Visit: Payer: Medicare Other | Admitting: Pharmacist

## 2023-05-03 NOTE — Progress Notes (Signed)
05/03/2023 Name: Nicole Douglas MRN: [id number] DOB: 20-Dec-1945  Chief Complaint  Patient presents with   Diabetes   Medication Management    Nicole Douglas is a 77 y.o. year old female who presented for a telephone visit.   They were referred to the pharmacist by their Case Management Team  for assistance in managing medication access.    Subjective:  Medication Access/Adherence  Patient reports affordability concerns with their medications: Yes  - cost of Januvia in coverage gap is $150 but we were able to enroll her in [employer] medication assistance program thru 06/21/2023. Patient reports she has 3 bottles of Januvia on hand.  Patient reports access/transportation concerns to their pharmacy: No  Patient reports adherence concerns with their medications:  No   Type 2 DM:  Current diabetes therapy - metformin 1000mg  twice a day;  Januvia 100mg  daily    Checking blood glucose once daily . Recent readings have been 85 to 130's  Hypertension:   Current therapy for blood pressure:  amlodipine 10mg  daily, losartan 50mg  2 tablets = 100mg  daily, carvedilol 12.5mg  twice a day.  Losartan last refilled 01/10/2023 for 100 day supply - patient states she has about 1 week remaining.   She does check her blood pressure at home sometimes. Recent blood pressure readings have been good - patient did not have exact readings to report.     BP Readings from Last 3 Encounters:  03/15/23 128/61  12/13/22 139/79  07/26/22 133/76     Objective:  Lab Results  Component Value Date   HGBA1C 7.1 (H) 03/15/2023    Lab Results  Component Value Date   CREATININE 0.78 03/15/2023   BUN 13 03/15/2023   NA 140 03/15/2023   K 4.0 03/15/2023   CL 103 03/15/2023   CO2 30 03/15/2023    Lab Results  Component Value Date   CHOL 190 12/13/2022   HDL 63.70 12/13/2022   LDLCALC 94 12/13/2022   TRIG 166.0 (H) 12/13/2022   CHOLHDL 3 12/13/2022    Medications Reviewed Today     Reviewed by  Henrene Pastor, RPH-CPP (Pharmacist) on 05/03/23 at 1345  Med List Status: <None>   Medication Order Taking? Sig Documenting Provider Last Dose Status Informant  Accu-Chek Softclix Lancets lancets [id number]  Use as directed to test up to 4 times daily Sandford Craze, NP  Active   amLODipine (NORVASC) 10 MG tablet [id number] Yes Take 1 tablet (10 mg total) by mouth daily. Sandford Craze, NP Taking Active   aspirin 81 MG tablet [id number] Yes Take 81 mg by mouth daily. [provider] Taking Active   atorvastatin (LIPITOR) 10 MG tablet [id number] Yes Take 1 tablet (10 mg total) by mouth daily. Sandford Craze, NP Taking Active   blood glucose meter kit and supplies KIT [id number]  Dispense based on patient and insurance preference. Use up to four times daily as directed. Sandford Craze, NP  Active   Blood Glucose Monitoring Suppl (ACCU-CHEK GUIDE) w/Device KIT [id number]  Use as directed up to 4 times daily Sandford Craze, NP  Active   Calcium Carbonate (CALTRATE 600 PO) [id number] Yes Take by mouth daily. [provider] Taking Active   carvedilol (COREG) 12.5 MG tablet [id number] Yes Take 1 tablet (12.5 mg total) by mouth 2 (two) times daily with a meal. Sandford Craze, NP Taking Active   estradiol (ESTRACE) 0.1 MG/GM vaginal cream [id number] Yes Place vaginally. [provider] Taking Active   glucose blood (ACCU-CHEK GUIDE) test  strip [id number] Yes Use as directed to test blood sugar up to 4 times daily. Sandford Craze, NP Taking Active   levothyroxine (SYNTHROID) 50 MCG tablet [id number] Yes TAKE 1 TABLET BY MOUTH ONCE  DAILY EXCEPT TAKE ONE-HALF  TABLET BY MOUTH ON Meridee Score, NP Taking Active   losartan (COZAAR) 50 MG tablet [id number] Yes TAKE 2 TABLETS BY MOUTH DAILY Sandford Craze, NP Taking Active   meclizine (ANTIVERT) 25 MG tablet [id number]  TAKE 1 TABLET BY MOUTH 3 TIMES  DAILY AS NEEDED FOR Barbie Banner,  Efraim Kaufmann, NP  Active   metFORMIN (GLUCOPHAGE) 1000 MG tablet [id number] Yes Take 1 tablet (1,000 mg total) by mouth 2 (two) times daily with a meal. Sandford Craze, NP Taking Active   omeprazole (PRILOSEC) 40 MG capsule [id number] Yes Take 1 capsule (40 mg total) by mouth daily. Sandford Craze, NP Taking Active   pioglitazone (ACTOS) 30 MG tablet [id number] Yes Take 1 tablet (30 mg total) by mouth daily. Henrene Pastor, RPH-CPP Taking Active   sitaGLIPtin (JANUVIA) 100 MG tablet [id number] Yes Take 1 tablet (100 mg total) by mouth daily. Henrene Pastor, RPH-CPP Taking Active               Assessment/Plan:   Diabetes: Home blood glucose readings slightly higher since started pioglitazone compared to Januvia.  - Reviewed goal A1c, goal fasting, and goal 2 hour post prandial glucose - Patient was approved to received 90 DS of Januvia from [employer] medication assistance program. Mailing her 2025 application for Januvia medication assistance program / [employer]. Patient to complete and return to office.   - Continue to take metformin and Januvia at current dose.  - Recommend to check glucose daily    Hypertension:Last office blood pressure was at goal   - Recommend she continue to check blood pressure 2 to 3 times per weed AND record.  - continue losartan, carvedilol and almodipine   Reviewed medication refill history - she should be due to refill both losartan and atorvastatin - patient report she will request refills soon - she has about 2 weeks remaining. Patient has refills remaining for both medications at [facility].   Discussed 2025 Medicare changes to her current plan. Her deductible for meds will be $340 with current plan. Patient encouraged to contact [facility] to see if there is another plan that has lower deductible.   Follow Up Plan: 1 to 2 months to check home blood glucose and medication adherence.    Henrene Pastor, PharmD Clinical Pharmacist [facility]  [facility]

## 2023-05-09 ENCOUNTER — Telehealth: Payer: Self-pay

## 2023-05-09 NOTE — Patient Outreach (Signed)
  Care Coordination   05/09/2023 Name: LEARAH COSTLEY MRN: [id number] DOB: 03-31-1946   Care Coordination Outreach Attempts:  An unsuccessful telephone outreach was attempted for a scheduled appointment today.  Follow Up Plan:  Additional outreach attempts will be made to offer the patient care coordination information and services.   Encounter Outcome:  No Answer   Care Coordination Interventions:  No, not indicated    Kathyrn Sheriff, RN, MSN, BSN, CCM Care Management Coordinator [phone]

## 2023-05-13 ENCOUNTER — Telehealth: Payer: Self-pay | Admitting: Family

## 2023-05-13 NOTE — Telephone Encounter (Signed)
Pt dropped off copies of paperwork for PCP. Pt doesn't need them back.

## 2023-05-16 NOTE — Telephone Encounter (Signed)
Form for [facility] assistance given to [employer]

## 2023-05-18 ENCOUNTER — Ambulatory Visit: Payer: Medicare Other | Admitting: Pharmacist

## 2023-05-18 DIAGNOSIS — E785 Hyperlipidemia, unspecified: Secondary | ICD-10-CM

## 2023-05-18 DIAGNOSIS — I10 Essential (primary) hypertension: Secondary | ICD-10-CM

## 2023-05-18 DIAGNOSIS — E119 Type 2 diabetes mellitus without complications: Secondary | ICD-10-CM

## 2023-05-18 NOTE — Progress Notes (Signed)
05/18/2023 Name: Nicole Douglas MRN: [id number] DOB: March 17, 1946  Chief Complaint  Patient presents with   Medication Management    Nicole Douglas is a 77 y.o. year old female. Coordinated with [employer] medication assistance program and called [facility] today.    They were referred to the pharmacist by their Case Management Team  for assistance in managing medication access.    Subjective:  Medication Access/Adherence  Patient reports affordability concerns with their medications: Yes  - cost of Januvia in coverage gap is $150 but we were able to enroll her in [employer] medication assistance program thru 06/21/2023. Patient reports she has 3 bottles of Januvia on hand.  I have received her 2025 application in the office this week.  Patient reports access/transportation concerns to their pharmacy: No  Patient reports adherence concerns with their medications:  No   Type 2 DM:  Current diabetes therapy - metformin 1000mg  twice a day;  Januvia 100mg  daily   Patient was taking pioglitazone for 2 months when cost of Januvia increased and while we were awaiting approval for Januvia / [employer] medication assistance program.   Last refill for metformin was for 100 DS on 02/24/2023 Checking blood glucose once daily.   Hyperlipidemia:  Current therapy: atorvastatin 10mg  daily  Last refill was for 100 DS on 02/05/2023  Hypertension:  Current therapy for blood pressure:  amlodipine 10mg  daily, losartan 50mg  2 tablets = 100mg  daily, carvedilol 12.5mg  twice a day.   Losartan last refilled 01/10/2023 for 100 day supply - patient was reminded at our last appointment that RF was due. Per [facility] she has an active RF on file for losartan and has not been requested recently.  Amlodipine and carvedilol were last refilled 02/24/2023 for 100 day supply   BP Readings from Last 3 Encounters:  03/15/23 128/61  12/13/22 139/79  07/26/22 133/76     Objective:  Lab Results  Component Value Date   HGBA1C 7.1  (H) 03/15/2023    Lab Results  Component Value Date   CREATININE 0.78 03/15/2023   BUN 13 03/15/2023   NA 140 03/15/2023   K 4.0 03/15/2023   CL 103 03/15/2023   CO2 30 03/15/2023    Lab Results  Component Value Date   CHOL 190 12/13/2022   HDL 63.70 12/13/2022   LDLCALC 94 12/13/2022   TRIG 166.0 (H) 12/13/2022   CHOLHDL 3 12/13/2022    Medications Reviewed Today     Reviewed by Henrene Pastor, RPH-CPP (Pharmacist) on 05/18/23 at [phone]  Med List Status: <None>   Medication Order Taking? Sig Documenting Provider Last Dose Status Informant  Accu-Chek Softclix Lancets lancets [id number] No Use as directed to test up to 4 times daily Sandford Craze, NP Taking Active   amLODipine (NORVASC) 10 MG tablet [id number] No Take 1 tablet (10 mg total) by mouth daily. Sandford Craze, NP Taking Active   aspirin 81 MG tablet [id number] No Take 81 mg by mouth daily. [provider] Taking Active   atorvastatin (LIPITOR) 10 MG tablet [id number] No Take 1 tablet (10 mg total) by mouth daily. Sandford Craze, NP Taking Active   blood glucose meter kit and supplies KIT [id number] No Dispense based on patient and insurance preference. Use up to four times daily as directed. Sandford Craze, NP Taking Active   Blood Glucose Monitoring Suppl (ACCU-CHEK GUIDE) w/Device KIT [id number] No Use as directed up to 4 times daily Sandford Craze, NP Taking Active   Calcium Carbonate (CALTRATE 600 PO)  [id number] No Take by mouth daily. [provider] Taking Active   carvedilol (COREG) 12.5 MG tablet [id number] No Take 1 tablet (12.5 mg total) by mouth 2 (two) times daily with a meal. Sandford Craze, NP Taking Active   estradiol (ESTRACE) 0.1 MG/GM vaginal cream [id number] No Place vaginally. [provider] Taking Active   glucose blood (ACCU-CHEK GUIDE) test strip [id number] No Use as directed to test blood sugar up to 4 times daily. Sandford Craze, NP Taking Active    levothyroxine (SYNTHROID) 50 MCG tablet [id number] No TAKE 1 TABLET BY MOUTH ONCE  DAILY EXCEPT TAKE ONE-HALF  TABLET BY MOUTH ON Meridee Score, NP Taking Active   losartan (COZAAR) 50 MG tablet [id number] No TAKE 2 TABLETS BY MOUTH DAILY Sandford Craze, NP Taking Active   meclizine (ANTIVERT) 25 MG tablet [id number] No TAKE 1 TABLET BY MOUTH 3 TIMES  DAILY AS NEEDED FOR Ebony Hail, NP Taking Active   metFORMIN (GLUCOPHAGE) 1000 MG tablet [id number] No Take 1 tablet (1,000 mg total) by mouth 2 (two) times daily with a meal. Sandford Craze, NP Taking Active   omeprazole (PRILOSEC) 40 MG capsule [id number] No Take 1 capsule (40 mg total) by mouth daily. Sandford Craze, NP Taking Active   pioglitazone (ACTOS) 30 MG tablet [id number] No Take 1 tablet (30 mg total) by mouth daily. Henrene Pastor, RPH-CPP Taking Active   sitaGLIPtin (JANUVIA) 100 MG tablet [id number] No Take 1 tablet (100 mg total) by mouth daily. Henrene Pastor, RPH-CPP Taking Active               Assessment/Plan:   Diabetes: Home blood glucose readings slightly higher since started pioglitazone compared to Januvia.  - Patient was approved to received 90 DS of Januvia from [employer] medication assistance program thru 06/21/2023.  - Reviewed patient portion and completed provider portion of 2025 [employer] application. Forwarded to PCP to review and sign. Will then mail to [employer] since they do not accept faxed applications.  - Continue to take metformin and Januvia at current dose.   Hyperlipidemia:  - Called [facility] to verify last refill date and to make sure active prescription - patient has not refilled yet but there is refills available. LM on VM reminding patient to refill atorvastatin .   Hypertension: Last office blood pressure was at goal   - Continue losartan, carvedilol and amlodipine  - Called [facility] to verify last refill dates and to make sure active prescriptions are available - There  are refill available for her blood pressure medication. LM on VM reminding patient to refill losartan and that carvedilol and amlodipine refills will be due soon.    Discussed 2025 Medicare changes to her current plan. Her deductible for meds will be $340 with current plan. Patient encouraged to contact [facility] to see if there is another plan that has lower deductible.   Follow Up Plan: January to check 2025 medication assistance program for Januvia, home blood glucose and medication adherence.    Henrene Pastor, PharmD Clinical Pharmacist  Primary Care [facility]

## 2023-05-30 DIAGNOSIS — Z7984 Long term (current) use of oral hypoglycemic drugs: Secondary | ICD-10-CM | POA: Diagnosis not present

## 2023-05-30 DIAGNOSIS — H35363 Drusen (degenerative) of macula, bilateral: Secondary | ICD-10-CM | POA: Diagnosis not present

## 2023-05-30 DIAGNOSIS — H2511 Age-related nuclear cataract, right eye: Secondary | ICD-10-CM | POA: Diagnosis not present

## 2023-05-30 DIAGNOSIS — H5203 Hypermetropia, bilateral: Secondary | ICD-10-CM | POA: Diagnosis not present

## 2023-05-30 DIAGNOSIS — H52203 Unspecified astigmatism, bilateral: Secondary | ICD-10-CM | POA: Diagnosis not present

## 2023-05-30 DIAGNOSIS — E119 Type 2 diabetes mellitus without complications: Secondary | ICD-10-CM | POA: Diagnosis not present

## 2023-05-30 DIAGNOSIS — Z961 Presence of intraocular lens: Secondary | ICD-10-CM | POA: Diagnosis not present

## 2023-05-30 DIAGNOSIS — H524 Presbyopia: Secondary | ICD-10-CM | POA: Diagnosis not present

## 2023-05-30 DIAGNOSIS — H25011 Cortical age-related cataract, right eye: Secondary | ICD-10-CM | POA: Diagnosis not present

## 2023-05-30 DIAGNOSIS — H40003 Preglaucoma, unspecified, bilateral: Secondary | ICD-10-CM | POA: Diagnosis not present

## 2023-05-30 LAB — HM DIABETES EYE EXAM

## 2023-06-01 ENCOUNTER — Telehealth: Payer: Self-pay | Admitting: *Deleted

## 2023-06-01 ENCOUNTER — Encounter: Payer: Self-pay | Admitting: Pharmacist

## 2023-06-01 NOTE — Progress Notes (Signed)
   06/01/2023 Name: Nicole Douglas MRN: [id number] DOB: 01/06/1946  Chief Complaint  Patient presents with   Medication Management    Nicole Douglas is a 77 y.o. year old female.    They were referred to the pharmacist by their Case Management Team  for assistance in managing medication access.    Subjective: Received application for 2025 for [employer] / Januvia back from PCP today.   Medication Access/Adherence  Patient reports affordability concerns with their medications: Yes  - cost of Januvia in coverage gap is $150 but we were able to enroll her in [employer] medication assistance program thru 06/21/2023. Patient reports she has 3 bottles of Januvia on hand.  I have received her 2025 application in the office this week.  Patient reports access/transportation concerns to their pharmacy: No  Patient reports adherence concerns with their medications:  No    Objective:  Lab Results  Component Value Date   HGBA1C 7.1 (H) 03/15/2023    Lab Results  Component Value Date   CREATININE 0.78 03/15/2023   BUN 13 03/15/2023   NA 140 03/15/2023   K 4.0 03/15/2023   CL 103 03/15/2023   CO2 30 03/15/2023    Lab Results  Component Value Date   CHOL 190 12/13/2022   HDL 63.70 12/13/2022   LDLCALC 94 12/13/2022   TRIG 166.0 (H) 12/13/2022   CHOLHDL 3 12/13/2022    Medications Reviewed Today   Medications were not reviewed in this encounter       Assessment/Plan:   Diabetes: Home blood glucose readings slightly higher since started pioglitazone compared to Januvia.  - Patient was approved to received 90 DS of Januvia from [employer] medication assistance program thru 06/21/2023. - Received application back from PCP; reviewed to make sure complete. Made a copy for patient's records and mailed application to [employer] patient assistance program.  - Called patient / LM on VM about [employer] application and reminded her that she will receive an Patent attorney from [employer] in a few weeks. She should  review, sign and mail back to them - Reminded patient that atorvastatin, metformin are due for refill. She does have active prescriptions on file at [facility].    Follow Up Plan: January to check 2025 medication assistance program for Januvia, home blood glucose and medication adherence.    Henrene Pastor, PharmD Clinical Pharmacist [facility]

## 2023-06-01 NOTE — Progress Notes (Signed)
  Care Coordination Note  06/01/2023 Name: Nicole Douglas MRN: [id number] DOB: 1945-07-13  Nicole Douglas is a 77 y.o. year old female who is a primary care patient of Sandford Craze, NP and is actively engaged with the care management team. I reached out to Lillia Pauls by phone today to assist with re-scheduling a follow up visit with the RN Case Manager  Follow up plan: Unsuccessful telephone outreach attempt made. A HIPAA compliant phone message was left for the patient providing contact information and requesting a return call.   Burman Nieves, CCMA Care Coordination Care Guide Direct Dial: [phone]

## 2023-06-01 NOTE — Progress Notes (Signed)
  Care Coordination Note  06/01/2023 Name: Nicole Douglas MRN: [id number] DOB: 1946-06-17  Nicole Douglas is a 77 y.o. year old female who is a primary care patient of Sandford Craze, NP and is actively engaged with the care management team. I reached out to Lillia Pauls by phone today to assist with re-scheduling a follow up visit with the RN Case Manager  Follow up plan: Telephone appointment with care management team member scheduled for:06/27/2022  Burman Nieves, [facility] Care Coordination Care Guide Direct Dial: [phone]

## 2023-06-13 ENCOUNTER — Ambulatory Visit: Payer: Medicare Other | Admitting: Family

## 2023-06-14 ENCOUNTER — Ambulatory Visit: Payer: Medicare Other | Admitting: Family

## 2023-06-14 ENCOUNTER — Telehealth: Payer: Self-pay | Admitting: Family

## 2023-06-14 VITALS — BP 133/65 | HR 82 | Temp 98.2°F | Resp 16 | Ht 66.0 in | Wt 169.0 lb

## 2023-06-14 DIAGNOSIS — E038 Other specified hypothyroidism: Secondary | ICD-10-CM

## 2023-06-14 DIAGNOSIS — I10 Essential (primary) hypertension: Secondary | ICD-10-CM

## 2023-06-14 DIAGNOSIS — E785 Hyperlipidemia, unspecified: Secondary | ICD-10-CM | POA: Diagnosis not present

## 2023-06-14 DIAGNOSIS — E119 Type 2 diabetes mellitus without complications: Secondary | ICD-10-CM | POA: Diagnosis not present

## 2023-06-14 DIAGNOSIS — H7390 Unspecified disorder of tympanic membrane, unspecified ear: Secondary | ICD-10-CM

## 2023-06-14 DIAGNOSIS — Z1231 Encounter for screening mammogram for malignant neoplasm of breast: Secondary | ICD-10-CM

## 2023-06-14 LAB — BASIC METABOLIC PANEL
BUN: 16 mg/dL (ref 6–23)
CO2: 29 meq/L (ref 19–32)
Calcium: 9.8 mg/dL (ref 8.4–10.5)
Chloride: 104 meq/L (ref 96–112)
Creatinine, Ser: 0.92 mg/dL (ref 0.40–1.20)
GFR: 59.47 mL/min — ABNORMAL LOW (ref >60.00)
Glucose, Bld: 109 mg/dL — ABNORMAL HIGH (ref 70–99)
Potassium: 4.5 meq/L (ref 3.5–5.1)
Sodium: 143 meq/L (ref 135–145)

## 2023-06-14 LAB — TSH: TSH: 1.38 u[IU]/mL (ref 0.35–5.50)

## 2023-06-14 LAB — HEMOGLOBIN A1C: Hgb A1c MFr Bld: 6.6 % — ABNORMAL HIGH (ref 4.6–6.5)

## 2023-06-14 NOTE — Telephone Encounter (Signed)
Opened in error

## 2023-06-14 NOTE — Assessment & Plan Note (Signed)
No erythema, could have mild serous effusion. Monitor for now. Let me know if she develops pain or hearing difficulties.

## 2023-06-14 NOTE — Assessment & Plan Note (Addendum)
BP Readings from Last 3 Encounters:  06/14/23 133/65  03/15/23 128/61  12/13/22 139/79   BP at goal. Continue amlodipine,carvedilol and losartan.

## 2023-06-14 NOTE — Assessment & Plan Note (Signed)
Lab Results  Component Value Date   CHOL 190 12/13/2022   HDL 63.70 12/13/2022   LDLCALC 94 12/13/2022   TRIG 166.0 (H) 12/13/2022   CHOLHDL 3 12/13/2022   LDL at goal.  Continue atorvastatin.

## 2023-06-14 NOTE — Assessment & Plan Note (Addendum)
Lab Results  Component Value Date   TSH 1.99 12/13/2022  Clinically stable on synthroid 50 mcg. Update TSH.

## 2023-06-14 NOTE — Assessment & Plan Note (Signed)
Update A1C, eye exam up to date.

## 2023-06-14 NOTE — Progress Notes (Signed)
Subjective:     Patient ID: [name], female    DOB: [birth year], 77 y.o.   MRN: [id number]  Chief Complaint  Patient presents with   Diabetes    Here for follow up   Hypertension    Here for follow up   Ear Fullness    Complains of feeling something in her ears, mostly the right side    HPI  Discussed the use of AI scribe software for clinical note transcription with the patient, who gave verbal consent to proceed.  History of Present Illness   Nicole Douglas, a patient with a history of diabetes, hyperlipidemia, and hypothyroidism, presents for a routine three-month follow-up. She reports a sensation of "something crawling" in her right ear and a feeling of blockage in her left ear. She denies any hearing loss or pain.  She also discusses her diabetes management. She is currently on Januvia and metformin, but is waiting to hear if she will continue to receive patient assistance for Januvia. If not, she is open to switching back to pioglitazone (Actos), which she was on previously.  Nicole Douglas also mentions a recent visit to Dr. Nilda Riggs, an ophthalmologist, for her cataracts. She was offered surgery for her right eye but has decided to wait.  She is also on Synthroid 50 for her hypothyroidism, Lipitor for her cholesterol, losartan 50 (two tablets), amlodipine 10, and carvedilol for her blood pressure. Her last cholesterol level was checked in June and was within normal limits, except for slightly elevated triglycerides.  [name]'s last mammogram was in October 2023, and she is due for another. Her last bone density test was in 2022 and was normal.        Lab Results  Component Value Date   HGBA1C 7.1 (H) 03/15/2023   HGBA1C 7.1 (H) 12/13/2022   HGBA1C 7.3 (H) 06/22/2022   Lab Results  Component Value Date   MICROALBUR <0.7 03/15/2023   LDLCALC 94 12/13/2022   CREATININE 0.78 03/15/2023     Health Maintenance Due  Topic Date Due   DEXA SCAN  12/12/2022   OPHTHALMOLOGY  EXAM  12/31/2022   COVID-19 Vaccine (6 - 2024-25 season) 02/20/2023   Medicare Annual Wellness (AWV)  03/12/2023   MAMMOGRAM  03/24/2023    Past Medical History:  Diagnosis Date   Hyperglycemia    Hyperlipemia    Hypertension    Thyroid disease     Past Surgical History:  Procedure Laterality Date   ABDOMINAL HYSTERECTOMY     BACK SURGERY     BIOPSY BREAST     left breast   BREAST BIOPSY Left    benign    ROTATOR CUFF REPAIR     bilateral    Family History  Problem Relation Age of Onset   Hypertension Other        both sides of family   Heart disease Other    Cancer Brother        throat   Heart attack Mother        died at 65   Heart attack Sister        age 78   Prostate cancer Father     Social History   Socioeconomic History   Marital status: Married    Spouse name: Not on file   Number of children: Not on file   Years of education: Not on file   Highest education level: Not on file  Occupational History   Not on file  Tobacco  Use   Smoking status: Never   Smokeless tobacco: Never  Substance and Sexual Activity   Alcohol use: No   Drug use: No   Sexual activity: Not on file  Other Topics Concern   Not on file  Social History Narrative   Retired from [employer]   3 grown children (oldest daughter in [place], Daughter local, Son deceased was murdered)   Married   Enjoys reading, crossword, adult coloring, sewing    No pets   Social Drivers of Corporate investment banker Strain: Low Risk  (03/09/2021)   Overall Financial Resource Strain (CARDIA)    Difficulty of Paying Living Expenses: Not hard at all  Food Insecurity: No Food Insecurity (02/18/2023)   Hunger Vital Sign    Worried About Running Out of Food in the Last Year: Never true    Ran Out of Food in the Last Year: Never true  Transportation Needs: No Transportation Needs (02/18/2023)   PRAPARE - Administrator, Civil Service (Medical): No    Lack of Transportation  (Non-Medical): No  Physical Activity: Sufficiently Active (03/09/2021)   Exercise Vital Sign    Days of Exercise per Week: 3 days    Minutes of Exercise per Session: 60 min  Stress: No Stress Concern Present (03/09/2021)   [employer] of Occupational Health - Occupational Stress Questionnaire    Feeling of Stress : Not at all  Social Connections: Moderately Integrated (03/09/2021)   Social Connection and Isolation Panel [NHANES]    Frequency of Communication with Friends and Family: More than three times a week    Frequency of Social Gatherings with Friends and Family: Once a week    Attends Religious Services: More than 4 times per year    Active Member of [employer] or Organizations: No    Attends Banker Meetings: Never    Marital Status: Married  Catering manager Violence: Not At Risk (03/09/2021)   Humiliation, Afraid, Rape, and Kick questionnaire    Fear of Current or Ex-Partner: No    Emotionally Abused: No    Physically Abused: No    Sexually Abused: No    Outpatient Medications Prior to Visit  Medication Sig Dispense Refill   Accu-Chek Softclix Lancets lancets Use as directed to test up to 4 times daily 200 each 12   amLODipine (NORVASC) 10 MG tablet Take 1 tablet (10 mg total) by mouth daily. 100 tablet 1   aspirin 81 MG tablet Take 81 mg by mouth daily.     atorvastatin (LIPITOR) 10 MG tablet Take 1 tablet (10 mg total) by mouth daily. 100 tablet 1   blood glucose meter kit and supplies KIT Dispense based on patient and insurance preference. Use up to four times daily as directed. 1 each 0   Blood Glucose Monitoring Suppl (ACCU-CHEK GUIDE) w/Device KIT Use as directed up to 4 times daily 1 kit 0   Calcium Carbonate (CALTRATE 600 PO) Take by mouth daily.     carvedilol (COREG) 12.5 MG tablet Take 1 tablet (12.5 mg total) by mouth 2 (two) times daily with a meal. 200 tablet 1   estradiol (ESTRACE) 0.1 MG/GM vaginal cream Place vaginally.     glucose blood  (ACCU-CHEK GUIDE) test strip Use as directed to test blood sugar up to 4 times daily. 200 each 12   levothyroxine (SYNTHROID) 50 MCG tablet TAKE 1 TABLET BY MOUTH ONCE  DAILY EXCEPT TAKE ONE-HALF  TABLET BY MOUTH ON SUNDAYS 94  tablet 2   losartan (COZAAR) 50 MG tablet TAKE 2 TABLETS BY MOUTH DAILY 200 tablet 2   meclizine (ANTIVERT) 25 MG tablet TAKE 1 TABLET BY MOUTH 3 TIMES  DAILY AS NEEDED FOR DIZZINESS 30 tablet 0   metFORMIN (GLUCOPHAGE) 1000 MG tablet Take 1 tablet (1,000 mg total) by mouth 2 (two) times daily with a meal. 200 tablet 1   omeprazole (PRILOSEC) 40 MG capsule Take 1 capsule (40 mg total) by mouth daily. 90 capsule 1   sitaGLIPtin (JANUVIA) 100 MG tablet Take 1 tablet (100 mg total) by mouth daily.     No facility-administered medications prior to visit.    No Known Allergies  ROS See HPI    Objective:    Physical Exam Constitutional:      General: She is not in acute distress.    Appearance: Normal appearance. She is well-developed.  HENT:     Head: Normocephalic and atraumatic.     Right Ear: External ear normal.     Left Ear: Tympanic membrane and external ear normal.     Ears:     Comments: R TM is dull no erythema, no bulging.  Small amount of cerumen noted in bilateral ear canals  Eyes:     General: No scleral icterus. Neck:     Thyroid: No thyromegaly.  Cardiovascular:     Rate and Rhythm: Normal rate and regular rhythm.     Heart sounds: Normal heart sounds. No murmur heard. Pulmonary:     Effort: Pulmonary effort is normal. No respiratory distress.     Breath sounds: Normal breath sounds. No wheezing.  Musculoskeletal:     Cervical back: Neck supple.  Skin:    General: Skin is warm and dry.  Neurological:     Mental Status: She is alert and oriented to person, place, and time.  Psychiatric:        Mood and Affect: Mood normal.        Behavior: Behavior normal.        Thought Content: Thought content normal.        Judgment: Judgment  normal.      BP 133/65 (BP Location: Right Arm, Patient Position: Sitting, Cuff Size: Small)   Pulse 82   Temp 98.2 F (36.8 C) (Oral)   Resp 16   Ht 5\' 6"  (1.676 m)   Wt 169 lb (76.7 kg)   SpO2 100%   BMI 27.28 kg/m  Wt Readings from Last 3 Encounters:  06/14/23 169 lb (76.7 kg)  03/15/23 163 lb (73.9 kg)  12/13/22 163 lb (73.9 kg)       Assessment & Plan:   Problem List Items Addressed This Visit       Unprioritized   Hypothyroidism   Lab Results  Component Value Date   TSH 1.99 12/13/2022  Clinically stable on synthroid 50 mcg. Update TSH.        Relevant Orders   TSH   Hyperlipidemia   Lab Results  Component Value Date   CHOL 190 12/13/2022   HDL 63.70 12/13/2022   LDLCALC 94 12/13/2022   TRIG 166.0 (H) 12/13/2022   CHOLHDL 3 12/13/2022   LDL at goal.  Continue atorvastatin.       HTN (hypertension)   BP Readings from Last 3 Encounters:  06/14/23 133/65  03/15/23 128/61  12/13/22 139/79   BP at goal. Continue amlodipine,carvedilol and losartan.       Dullness of light reflex of tympanic membrane  No erythema, could have mild serous effusion. Monitor for now. Let me know if she develops pain or hearing difficulties.      Controlled type 2 diabetes mellitus without complication, without long-term current use of insulin (HCC) - Primary   Update A1C, eye exam up to date.       Relevant Orders   HgB A1c   Basic Metabolic Panel (BMET)   Urine Microalbumin w/creat. ratio   Other Visit Diagnoses       Breast cancer screening by mammogram       Relevant Orders   MM 3D SCREENING MAMMOGRAM BILATERAL BREAST       I am having Lillia Pauls maintain her aspirin, Calcium Carbonate (CALTRATE 600 PO), estradiol, Accu-Chek Guide, losartan, Accu-Chek Guide, Accu-Chek Softclix Lancets, amLODipine, atorvastatin, blood glucose meter kit and supplies, carvedilol, metFORMIN, omeprazole, levothyroxine, meclizine, and sitaGLIPtin.  No orders of the  defined types were placed in this encounter.

## 2023-06-14 NOTE — Patient Instructions (Signed)
VISIT SUMMARY:  Nicole Douglas, you came in today for your routine three-month follow-up. We discussed your diabetes management, ear complaints, and reviewed your medications for hyperlipidemia, hypertension, and hypothyroidism. We also talked about your recent visit to the ophthalmologist and your general health maintenance.  YOUR PLAN:  -TYPE 2 DIABETES MELLITUS: Type 2 Diabetes Mellitus is a condition where your body does not use insulin properly, leading to high blood sugar levels. We will continue your current medications, Januvia and Metformin, and check your A1c level today. If Januvia is not approved for ongoing use, we may switch to an alternative medication.  -EAR COMPLAINTS: You reported a sensation of crawling in your right ear and blockage in your left ear. This could be due to ear wax or fluid behind the eardrum. We recommend using over-the-counter ear wax drop kits and monitoring your symptoms. If you develop hearing issues, we may refer you to an ear specialist.  -HYPERLIPIDEMIA: Hyperlipidemia is a condition where you have high levels of fats (lipids) in your blood. Your cholesterol levels are good except for slightly elevated triglycerides. Continue taking Lipitor and make lifestyle changes to help lower your triglycerides.  -HYPERTENSION: Hypertension is high blood pressure. Your blood pressure is well-controlled with your current medications, Losartan and Amlodipine. Continue with your current regimen.  -THYROID DISORDER: You have a thyroid disorder, which means your thyroid gland does not produce enough hormones. Your condition is stable with Synthroid. Continue taking your current dose.  -GENERAL HEALTH MAINTENANCE: We will order a mammogram for you, and your next bone density scan is due in 2025. Follow up in 3 months for your next routine check-up.  INSTRUCTIONS:  Please follow up in 3 months for your next routine check-up. We will also check your A1c level today and order a  mammogram for you.

## 2023-06-16 ENCOUNTER — Telehealth: Payer: Self-pay | Admitting: *Deleted

## 2023-06-16 NOTE — Telephone Encounter (Signed)
Urine microalbumin was not sent out.  Pt needs to come back in to recollect.    Left message on machine for pt to come back for [place] to recollect specimen.

## 2023-06-16 NOTE — Addendum Note (Signed)
Addended by: Thelma Barge D on: 06/16/2023 08:26 AM   Modules accepted: Orders

## 2023-06-17 NOTE — Telephone Encounter (Signed)
Left message on machine at home and on cell.  Pt needs to come back in to recollect urine.

## 2023-06-18 ENCOUNTER — Encounter: Payer: Self-pay | Admitting: Family

## 2023-06-20 ENCOUNTER — Encounter: Payer: Self-pay | Admitting: *Deleted

## 2023-06-23 ENCOUNTER — Other Ambulatory Visit (INDEPENDENT_AMBULATORY_CARE_PROVIDER_SITE_OTHER): Payer: Medicare Other

## 2023-06-23 DIAGNOSIS — E119 Type 2 diabetes mellitus without complications: Secondary | ICD-10-CM | POA: Diagnosis not present

## 2023-06-23 LAB — MICROALBUMIN / CREATININE URINE RATIO
Creatinine,U: 121.2 mg/dL
Microalb Creat Ratio: 0.6 mg/g (ref 0.0–30.0)
Microalb, Ur: <0.7 mg/dL (ref 0.0–1.9)

## 2023-06-28 ENCOUNTER — Telehealth: Payer: Self-pay

## 2023-06-28 NOTE — Patient Outreach (Signed)
  Care Coordination   06/28/2023 Name: SHEQUITA PEPLINSKI MRN: [id number] DOB: 28-Apr-1946   Care Coordination Outreach Attempts:  An unsuccessful outreach was attempted for an appointment today.  Follow Up Plan:  Additional outreach attempts will be made to offer the patient complex care management information and services.   Encounter Outcome:  No Answer   Care Coordination Interventions:  No, not indicated    Heddy Shutter, RN, MSN, BSN, CCM Care Management Coordinator [phone]

## 2023-07-01 ENCOUNTER — Other Ambulatory Visit: Payer: Self-pay | Admitting: Pharmacist

## 2023-07-01 DIAGNOSIS — E119 Type 2 diabetes mellitus without complications: Secondary | ICD-10-CM

## 2023-07-01 NOTE — Progress Notes (Signed)
 07/01/2023 Name: Nicole Douglas MRN: [id number] DOB: Aug 02, 1945  Chief Complaint  Patient presents with   Medication Management   Diabetes    Nicole Douglas is a 78 y.o. year old female. Spoke with patient today for a phone visit.    They were referred to the pharmacist by their Case Management Team  for assistance in managing medication access.    Subjective:  Medication Access/Adherence  Patient reports affordability concerns with their medications: Yes  - cost of Januvia  in coverage gap is $150 but we were able to enroll her in [employer] medication assistance program thru 06/21/2023. Application for 2025 was mailed to [facility] patient assistance program in December 2024 but patient received denial. It looks like it was missing 1 area for provider to sign. She reports today she has about 18 tablets of Januvia  on hand. Patient reports access/transportation concerns to their pharmacy: No  Patient reports adherence concerns with their medications:  No   Type 2 DM:  Current diabetes therapy - metformin  1000mg  twice a day;  Januvia  100mg  daily   Patient was taking pioglitazone  for 2 months when cost of Januvia  increased and while we were awaiting approval for Januvia  / [employer] medication assistance program.   Last refill for metformin  was for 100 DS on 02/24/2023 Checking blood glucose once daily.   Hyperlipidemia:  Current therapy: atorvastatin  10mg  daily  Last refill was for 100 DS on 02/05/2023  Hypertension:  Current therapy for blood pressure:  amlodipine  10mg  daily, losartan  50mg  2 tablets = 100mg  daily, carvedilol  12.5mg  twice a day.   Losartan  last refilled 01/10/2023 for 100 day supply - patient was reminded at our last appointment that RF was due. Per [facility] she has an active RF on file for losartan  and has not been requested recently.  Amlodipine  and carvedilol  were last refilled 02/24/2023 for 100 day supply   BP Readings from Last 3 Encounters:  06/14/23 133/65  03/15/23  128/61  12/13/22 139/79     Objective:  Lab Results  Component Value Date   HGBA1C 6.6 (H) 06/14/2023    Lab Results  Component Value Date   CREATININE 0.92 06/14/2023   BUN 16 06/14/2023   NA 143 06/14/2023   K 4.5 06/14/2023   CL 104 06/14/2023   CO2 29 06/14/2023    Lab Results  Component Value Date   CHOL 190 12/13/2022   HDL 63.70 12/13/2022   LDLCALC 94 12/13/2022   TRIG 166.0 (H) 12/13/2022   CHOLHDL 3 12/13/2022    Medications Reviewed Today     Reviewed by Carla Milling, RPH-CPP (Pharmacist) on 07/01/23 at 1435  Med List Status: <None>   Medication Order Taking? Sig Documenting Provider Last Dose Status Informant  Accu-Chek Softclix Lancets lancets [id number] Yes Use as directed to test up to 4 times daily O'Sullivan, Melissa, NP Taking Active   amLODipine  (NORVASC ) 10 MG tablet [id number] Yes Take 1 tablet (10 mg total) by mouth daily. Daryl Setter, NP Taking Active   aspirin 81 MG tablet [id number] Yes Take 81 mg by mouth daily. [provider] Taking Active   atorvastatin  (LIPITOR) 10 MG tablet [id number] Yes Take 1 tablet (10 mg total) by mouth daily. Daryl Setter, NP Taking Active   blood glucose meter kit and supplies KIT [id number] Yes Dispense based on patient and insurance preference. Use up to four times daily as directed. Daryl Setter, NP Taking Active   Blood Glucose Monitoring Suppl (ACCU-CHEK GUIDE) w/Device KIT [id number] Yes Use as  directed up to 4 times daily Daryl Setter, NP Taking Active   Calcium  Carbonate (CALTRATE 600 PO) [id number] Yes Take by mouth daily. [provider] Taking Active   carvedilol  (COREG ) 12.5 MG tablet [id number] Yes Take 1 tablet (12.5 mg total) by mouth 2 (two) times daily with a meal. Daryl Setter, NP Taking Active   estradiol  (ESTRACE ) 0.1 MG/GM vaginal cream [id number] Yes Place vaginally. [provider] Taking Active   glucose blood (ACCU-CHEK GUIDE) test strip  [id number] Yes Use as directed to test blood sugar up to 4 times daily. Daryl Setter, NP Taking Active   levothyroxine  (SYNTHROID ) 50 MCG tablet [id number] Yes TAKE 1 TABLET BY MOUTH ONCE  DAILY EXCEPT TAKE ONE-HALF  TABLET BY MOUTH ON JOSEFINE Daryl Setter, NP Taking Active   losartan  (COZAAR ) 50 MG tablet [id number] Yes TAKE 2 TABLETS BY MOUTH DAILY O'Sullivan, Melissa, NP Taking Active   meclizine  (ANTIVERT ) 25 MG tablet [id number] Yes TAKE 1 TABLET BY MOUTH 3 TIMES  DAILY AS NEEDED FOR DIZZINESS O'Sullivan, Melissa, NP Taking Active   metFORMIN  (GLUCOPHAGE ) 1000 MG tablet [id number] Yes Take 1 tablet (1,000 mg total) by mouth 2 (two) times daily with a meal. Daryl Setter, NP Taking Active   omeprazole  (PRILOSEC) 40 MG capsule [id number] Yes Take 1 capsule (40 mg total) by mouth daily. Daryl Setter, NP Taking Active   sitaGLIPtin  (JANUVIA ) 100 MG tablet [id number] Yes Take 1 tablet (100 mg total) by mouth daily. Carla Milling, RPH-CPP Taking Active            Med Note JUSTINO, Taryne Kiger B   Fri Jul 01, 2023  2:34 PM) [employer] patient assistance program 06/21/2023              Assessment/Plan:   Diabetes: Home blood glucose readings slightly higher since started pioglitazone  compared to Januvia .  - Patient was approved to received 90 DS of Januvia  from [employer] medication assistance program thru 06/21/2023.  - Patient will bring in application that was mailed back to her from [employer] so that we can update the application.   - Continue to take metformin  and Januvia  at current dose.   Hyperlipidemia:  - Called [facility] to verify last refill date and to make sure active prescription - patient has not refilled yet but there are refills available. Reminded patient about refills and discussed adherence.   Hypertension: Last office blood pressure was at goal   - Continue losartan , carvedilol  and amlodipine   - Discussed adherence; verified patient has refills remaining on her  hypertension medications.   Discussed 2025 Medicare changes to her current plan. Her deductible for meds will be $340 with current plan.   Follow Up Plan: 2 to 4 weeks.    Milling Carla, PharmD Clinical Pharmacist [facility]

## 2023-07-12 ENCOUNTER — Telehealth: Payer: Self-pay | Admitting: *Deleted

## 2023-07-12 NOTE — Progress Notes (Unsigned)
Complex Care Management Care Guide Note  07/12/2023 Name: Nicole Douglas MRN: [id number] DOB: 1946-06-05  [name] is a 78 y.o. year old female who is a primary care patient of Sandford Craze, NP and is actively engaged with the care management team. I reached out to Lillia Pauls by phone today to assist with re-scheduling  with the RN Case Manager.  Follow up plan: Unsuccessful telephone outreach attempt made. A HIPAA compliant phone message was left for the patient providing contact information and requesting a return call.  Burman Nieves, CMA, Care Guide [facility]  [facility], [facility] Guide Direct Dial: [phone]  Fax: [phone] Website: [email or web address]

## 2023-07-13 NOTE — Progress Notes (Signed)
Complex Care Management Care Guide Note  07/13/2023 Name: Nicole Douglas MRN: [id number] DOB: Nov 11, 1945  Nicole Douglas is a 78 y.o. year old female who is a primary care patient of Sandford Craze, NP and is actively engaged with the care management team. I reached out to Lillia Pauls by phone today to assist with re-scheduling  with the RN Case Manager.  Follow up plan: Telephone appointment with complex care management team member scheduled for:  10/12/2023 - pt requested appt after she sees pcp in March  Burman Nieves, CMA, Care Guide [facility], [facility] Guide Direct Dial: [phone]  Fax: [phone] Website: [email or web address]

## 2023-07-13 NOTE — Progress Notes (Deleted)
Complex Care Management Care Guide Note  07/13/2023 Name: Nicole Douglas MRN: [id number] DOB: 08-17-1945  Nicole Douglas is a 78 y.o. year old female who is a primary care patient of Sandford Craze, NP and is actively engaged with the care management team. I reached out to Lillia Pauls by phone today to assist with re-scheduling  with the RN Case Manager.  Follow up plan: Unsuccessful telephone outreach attempt made. A HIPAA compliant phone message was left for the patient providing contact information and requesting a return call. No additional outreach attempts will be made due to inability to maintain patient contact.   Burman Nieves, CMA, Care Guide [facility]  [facility], [facility] Guide Direct Dial: [phone]  Fax: [phone] Website: [email or web address]

## 2023-07-14 ENCOUNTER — Other Ambulatory Visit (HOSPITAL_BASED_OUTPATIENT_CLINIC_OR_DEPARTMENT_OTHER): Payer: Self-pay

## 2023-07-14 ENCOUNTER — Other Ambulatory Visit: Payer: Self-pay | Admitting: Family

## 2023-07-14 ENCOUNTER — Encounter (HOSPITAL_BASED_OUTPATIENT_CLINIC_OR_DEPARTMENT_OTHER): Payer: Self-pay

## 2023-07-14 MED ORDER — SITAGLIPTIN PHOSPHATE 100 MG PO TABS
100.0000 mg | ORAL_TABLET | Freq: Every day | ORAL | 1 refills | Status: AC
  Filled 2023-07-14: qty 30, 30d supply, fill #0

## 2023-07-15 ENCOUNTER — Other Ambulatory Visit (HOSPITAL_BASED_OUTPATIENT_CLINIC_OR_DEPARTMENT_OTHER): Payer: Self-pay

## 2023-07-17 ENCOUNTER — Telehealth: Payer: Self-pay | Admitting: Pharmacist

## 2023-07-17 NOTE — Telephone Encounter (Signed)
Attempted to contact patient to let her know that updated patient assistance program application and attestation was sent to [employer] but decision might take up to 2 weeks. Left message with CB# [phone] or [phone].  We can consider restarting Pioglitazone 30mg  daily like we did last year when we were awaiting decision about Januvia patient assistance program application. Could consider pioglitazone on either a permanent basis or temporarily until we find out if she is approved for medication assistance program.  In addition she has been informed by the pharmacy about possibly using the Medicare payment plan for her medications. She would need to contact her insurance company to enroll.  Per Medicare.gov - her payment plan might look like this

## 2023-07-18 ENCOUNTER — Telehealth: Payer: Self-pay | Admitting: Neurology

## 2023-07-18 NOTE — Telephone Encounter (Signed)
Copied from CRM [id number]. Topic: Clinical - Medication Question >> Jul 18, 2023  3:24 PM Kathryne Eriksson wrote: Reason for CRM: sitaGLIPtin (JANUVIA) 100 MG tablet >> Jul 18, 2023  3:26 PM Kathryne Eriksson wrote: Patient called in stating that the medication sitaGLIPtin (JANUVIA) 100 MG tablet is a bit on the expensive side and wants to know if there's anything she can get as an alternative that's not so expensive.

## 2023-07-19 ENCOUNTER — Other Ambulatory Visit (HOSPITAL_BASED_OUTPATIENT_CLINIC_OR_DEPARTMENT_OTHER): Payer: Self-pay

## 2023-07-19 ENCOUNTER — Telehealth: Payer: Self-pay | Admitting: Family

## 2023-07-19 MED ORDER — PIOGLITAZONE HCL 30 MG PO TABS: 30.0000 mg | ORAL_TABLET | Freq: Every day | ORAL | 1 refills | Status: DC

## 2023-07-19 NOTE — Telephone Encounter (Signed)
Patient has a $340 deductible to meet, then cost will be $47 per month. Her first refill in 2025 due to deductible would be $387.  We have applied for medication assistance program for Januvia. Patient receive a letter and thought that she was denied but it was actually just an attestation the [employer] medication assistance program requires patient to sign. That was received and faxed yesterday 07/18/2023. It will take about 5 to 7 business days before we hear about decision.  I left a message on her VM.  If she cannot afford deductible she can consider the payment plan that stretches out the cost over the year or consider generic alternative - last year we had her take pioglitazone 30mg  while we waiting for medication assistance program approval.   Spoke with patient and she would like to take pioglitazone 30mg  daily again until decision from Januvia medication assistance program is received. She requested Rx be sent to [facility] which is closer to her if approved.

## 2023-07-19 NOTE — Telephone Encounter (Signed)
There is another phone note on this that I sent you.  tks

## 2023-07-19 NOTE — Telephone Encounter (Signed)
Hi Tammy, apparently Jardiance copay is >$300.  Is this a deductible issue or do you know if there is a formulary drug that would be better for her? Tks!

## 2023-07-19 NOTE — Addendum Note (Signed)
Addended by: Sandford Craze on: 07/19/2023 02:49 PM   Modules accepted: Orders

## 2023-07-19 NOTE — Telephone Encounter (Signed)
Please advise pt that I sent rx for pioglitazone to [facility] for her to take until she can get the [place], then she can stop the pioglitazone.

## 2023-07-20 NOTE — Telephone Encounter (Signed)
Patient was notified of new medication and notified this is to take until she is able to get [place] again

## 2023-07-21 ENCOUNTER — Other Ambulatory Visit: Payer: Medicare Other | Admitting: Pharmacist

## 2023-07-21 DIAGNOSIS — K219 Gastro-esophageal reflux disease without esophagitis: Secondary | ICD-10-CM

## 2023-07-21 DIAGNOSIS — E785 Hyperlipidemia, unspecified: Secondary | ICD-10-CM

## 2023-07-21 DIAGNOSIS — E119 Type 2 diabetes mellitus without complications: Secondary | ICD-10-CM | POA: Insufficient documentation

## 2023-07-21 MED ORDER — METFORMIN HCL 1000 MG PO TABS: 1000.0000 mg | ORAL_TABLET | Freq: Two times a day (BID) | ORAL | 1 refills | Status: DC

## 2023-07-21 MED ORDER — LOSARTAN POTASSIUM 50 MG PO TABS: 50.0000 mg | ORAL_TABLET | Freq: Two times a day (BID) | ORAL | 1 refills | Status: DC

## 2023-07-21 MED ORDER — OMEPRAZOLE 40 MG PO CPDR: 40.0000 mg | DELAYED_RELEASE_CAPSULE | Freq: Every day | ORAL | 0 refills | Status: DC

## 2023-07-21 NOTE — Progress Notes (Signed)
07/01/2023 Name: Nicole Douglas MRN: [id number] DOB: 06/15/1946  Chief Complaint  Patient presents with   Diabetes   Medication Management    Nicole Douglas is a 78 y.o. year old female. Spoke with patient today for a phone visit.    They were referred to the pharmacist by their Case Management Team  for assistance in managing diabetes and medication access.    Subjective:  Medication Access/Adherence  Patient reports affordability concerns with their medications: Yes  - cost of Januvia is >$ 350 [employer] medication assistance program application for 2025 was mailed to [facility] patient assistance program.Patient received attestation but was not sure what it was. Explained that the attestation had to be signed stating that she had Medicare Part D but that cost of Januvia was still unaffordable. Attestation was signed 07/18/2023 and was faxed  back to [employer].   Patient reports access/transportation concerns to their pharmacy: No  Patient reports adherence concerns with their medications:  No   Type 2 DM:  Current diabetes therapy - metformin 1000mg  twice a day;  pioglitazone 30mg  daily - will take until she received delivery of Januvia from [employer] medication assistance program.  Then will restart Januvia 100mg  daily and stop pioglitazone.    Last refill for metformin was for 100 DS on 02/24/2023 -per patient she will need an updated prescription sent to [facility] since there are no remaining refills for metformin.  Checking blood glucose once daily. Patient report highest reading recently was 157. Usually 110 to 130.   Hyperlipidemia:  Current therapy: atorvastatin 10mg  daily  Last refill was for 100 DS on 02/05/2023 however when asked patient reported she has atorvastatin on hand at home. She has 1 refill remaining on Rx at [facility].   Hypertension:  Current therapy for blood pressure:  amlodipine 10mg  daily, losartan 50mg  - takes 1 tablet twice a day, carvedilol 12.5mg  twice a day.   Losartan  last refilled 01/10/2023 for 100 day supply - needs up dated Rx sent to [facility].  Amlodipine and carvedilol were last refilled 02/24/2023 for 100 day supply -per patient she has medication at home. Both medications have a refill remaining on file at [facility].    BP Readings from Last 3 Encounters:  06/14/23 133/65  03/15/23 128/61  12/13/22 139/79     Objective:  Lab Results  Component Value Date   HGBA1C 6.6 (H) 06/14/2023    Lab Results  Component Value Date   CREATININE 0.92 06/14/2023   BUN 16 06/14/2023   NA 143 06/14/2023   K 4.5 06/14/2023   CL 104 06/14/2023   CO2 29 06/14/2023    Lab Results  Component Value Date   CHOL 190 12/13/2022   HDL 63.70 12/13/2022   LDLCALC 94 12/13/2022   TRIG 166.0 (H) 12/13/2022   CHOLHDL 3 12/13/2022    Medications Reviewed Today     Reviewed by Henrene Pastor, RPH-CPP (Pharmacist) on 07/21/23 at 1140  Med List Status: <None>   Medication Order Taking? Sig Documenting Provider Last Dose Status Informant  Accu-Chek Softclix Lancets lancets [id number]  Use as directed to test up to 4 times daily Sandford Craze, NP  Active   amLODipine (NORVASC) 10 MG tablet [id number] Yes Take 1 tablet (10 mg total) by mouth daily. Sandford Craze, NP Taking Active   aspirin 81 MG tablet [id number] Yes Take 81 mg by mouth daily. [provider] Taking Active   atorvastatin (LIPITOR) 10 MG tablet [id number] Yes Take 1 tablet (10 mg  total) by mouth daily. Sandford Craze, NP Taking Active   blood glucose meter kit and supplies KIT [id number]  Dispense based on patient and insurance preference. Use up to four times daily as directed. Sandford Craze, NP  Active   Blood Glucose Monitoring Suppl (ACCU-CHEK GUIDE) w/Device KIT [id number]  Use as directed up to 4 times daily Sandford Craze, NP  Active   Calcium Carbonate (CALTRATE 600 PO) [id number]  Take by mouth daily. [provider]  Active   carvedilol (COREG) 12.5 MG tablet  [id number] Yes Take 1 tablet (12.5 mg total) by mouth 2 (two) times daily with a meal. Sandford Craze, NP Taking Active   estradiol (ESTRACE) 0.1 MG/GM vaginal cream [id number]  Place vaginally. [provider]  Active   glucose blood (ACCU-CHEK GUIDE) test strip [id number] Yes Use as directed to test blood sugar up to 4 times daily. Sandford Craze, NP Taking Active   levothyroxine (SYNTHROID) 50 MCG tablet [id number] Yes TAKE 1 TABLET BY MOUTH ONCE  DAILY EXCEPT TAKE ONE-HALF  TABLET BY MOUTH ON Meridee Score, NP Taking Active   losartan (COZAAR) 50 MG tablet [id number]  TAKE 2 TABLETS BY MOUTH DAILY  Patient taking differently: Take 50 mg by mouth in the morning and at bedtime.   Sandford Craze, NP  Active   meclizine (ANTIVERT) 25 MG tablet [id number]  TAKE 1 TABLET BY MOUTH 3 TIMES  DAILY AS NEEDED FOR Ebony Hail, NP  Active   metFORMIN (GLUCOPHAGE) 1000 MG tablet [id number] Yes Take 1 tablet (1,000 mg total) by mouth 2 (two) times daily with a meal. Sandford Craze, NP Taking Active   omeprazole (PRILOSEC) 40 MG capsule [id number]  Take 1 capsule (40 mg total) by mouth daily. Sandford Craze, NP  Active   pioglitazone (ACTOS) 30 MG tablet [id number] Yes Take 1 tablet (30 mg total) by mouth daily. Stop taking once you get the Januvia approved Sandford Craze, NP Taking Active   sitaGLIPtin (JANUVIA) 100 MG tablet [id number] No Take 1 tablet (100 mg total) by mouth daily.  Patient not taking: Reported on 07/21/2023   Bradd Canary, MD Not Taking Active               Assessment/Plan:   Diabetes: Last A1c was at goal of < 7.0% -Called [employer] medication assistance program - confirmed attestation was received and patient was approved 07/18/2023.  - Patient was approved to received 90 DS of Januvia from [employer] medication assistance program thru 06/20/2024. She should receive her first delivery in 7 to 10 business days. Until she  receives Januvia, she will take pioglitazone 30mg  once a day.  - Continue to take metformin - updated Rx for 100 DS sent to [facility].  Hyperlipidemia:  - Continue atorvastatin 10mg  daily. Reminded patient that she is past due to have atorvastatin refilled and there are refills remaining at [facility].   Hypertension: Last office blood pressure was at goal   - Continue losartan, carvedilol and amlodipine  - Discussed adherence. Patient is aware she has refills available on amlodipine and carvedilol when needed at [facility].  - Sent in updated RF for losartan to [facility]  Patient will also need updated Rx for omeprazole  Follow Up Plan: 3 months   Henrene Pastor, PharmD Clinical Pharmacist [facility]

## 2023-07-26 ENCOUNTER — Other Ambulatory Visit (HOSPITAL_BASED_OUTPATIENT_CLINIC_OR_DEPARTMENT_OTHER): Payer: Self-pay

## 2023-08-01 ENCOUNTER — Other Ambulatory Visit: Payer: Self-pay | Admitting: Family

## 2023-08-14 ENCOUNTER — Other Ambulatory Visit: Payer: Self-pay | Admitting: Family

## 2023-08-15 ENCOUNTER — Other Ambulatory Visit (HOSPITAL_BASED_OUTPATIENT_CLINIC_OR_DEPARTMENT_OTHER): Payer: Self-pay

## 2023-08-15 ENCOUNTER — Telehealth: Payer: Self-pay

## 2023-08-15 NOTE — Telephone Encounter (Signed)
 PAP: Patient assistance application for Januvia has been approved by PAP Companies: [employer] from 07/19/2023 to 06/20/2024. Medication should be delivered to PAP Delivery: Home. For further shipping updates, please contact [employer] at [phone]. Patient ID is: not provided Patient rec'd shipment of 90 DS on 07-26-2023

## 2023-09-13 ENCOUNTER — Encounter (HOSPITAL_BASED_OUTPATIENT_CLINIC_OR_DEPARTMENT_OTHER): Payer: Self-pay

## 2023-09-13 ENCOUNTER — Ambulatory Visit: Payer: Medicare Other | Admitting: Family

## 2023-09-13 ENCOUNTER — Ambulatory Visit (HOSPITAL_BASED_OUTPATIENT_CLINIC_OR_DEPARTMENT_OTHER)
Admission: RE | Admit: 2023-09-13 | Discharge: 2023-09-13 | Disposition: A | Discharge status: Home / Self Care | Payer: Medicare Other | Source: Ambulatory Visit | Attending: Family | Admitting: Family

## 2023-09-13 DIAGNOSIS — Z1231 Encounter for screening mammogram for malignant neoplasm of breast: Secondary | ICD-10-CM | POA: Insufficient documentation

## 2023-09-15 ENCOUNTER — Other Ambulatory Visit: Payer: Self-pay | Admitting: Family

## 2023-09-15 DIAGNOSIS — R928 Other abnormal and inconclusive findings on diagnostic imaging of breast: Secondary | ICD-10-CM

## 2023-09-15 NOTE — Telephone Encounter (Signed)
 Copied from CRM [id number]. Topic: General - Call Back - No Documentation >> Sep 14, 2023  5:17 PM Tiffany S wrote: Reason for CRM: Patient was returning Windell Moulding call about out imaging results please follow up with patient

## 2023-09-15 NOTE — Telephone Encounter (Signed)
 Spoke with patient.

## 2023-09-19 ENCOUNTER — Other Ambulatory Visit: Payer: Self-pay | Admitting: Family

## 2023-09-20 ENCOUNTER — Ambulatory Visit (INDEPENDENT_AMBULATORY_CARE_PROVIDER_SITE_OTHER): Admitting: Family

## 2023-09-20 ENCOUNTER — Telehealth: Payer: Self-pay | Admitting: Family

## 2023-09-20 VITALS — BP 123/63 | HR 79 | Temp 98.7°F | Resp 16 | Ht 66.0 in | Wt 163.0 lb

## 2023-09-20 DIAGNOSIS — Z7984 Long term (current) use of oral hypoglycemic drugs: Secondary | ICD-10-CM | POA: Diagnosis not present

## 2023-09-20 DIAGNOSIS — I10 Essential (primary) hypertension: Secondary | ICD-10-CM | POA: Diagnosis not present

## 2023-09-20 DIAGNOSIS — Z780 Asymptomatic menopausal state: Secondary | ICD-10-CM | POA: Diagnosis not present

## 2023-09-20 DIAGNOSIS — E119 Type 2 diabetes mellitus without complications: Secondary | ICD-10-CM

## 2023-09-20 DIAGNOSIS — E785 Hyperlipidemia, unspecified: Secondary | ICD-10-CM | POA: Diagnosis not present

## 2023-09-20 DIAGNOSIS — R011 Cardiac murmur, unspecified: Secondary | ICD-10-CM | POA: Diagnosis not present

## 2023-09-20 DIAGNOSIS — M25512 Pain in left shoulder: Secondary | ICD-10-CM | POA: Insufficient documentation

## 2023-09-20 DIAGNOSIS — H6993 Unspecified Eustachian tube disorder, bilateral: Secondary | ICD-10-CM | POA: Insufficient documentation

## 2023-09-20 DIAGNOSIS — M25511 Pain in right shoulder: Secondary | ICD-10-CM

## 2023-09-20 DIAGNOSIS — H9202 Otalgia, left ear: Secondary | ICD-10-CM | POA: Insufficient documentation

## 2023-09-20 LAB — COMPREHENSIVE METABOLIC PANEL WITH GFR
ALT: 18 U/L (ref 0–35)
AST: 15 U/L (ref 0–37)
Albumin: 4.6 g/dL (ref 3.5–5.2)
Alkaline Phosphatase: 89 U/L (ref 39–117)
BUN: 18 mg/dL (ref 6–23)
CO2: 29 meq/L (ref 19–32)
Calcium: 9.7 mg/dL (ref 8.4–10.5)
Chloride: 103 meq/L (ref 96–112)
Creatinine, Ser: 0.9 mg/dL (ref 0.40–1.20)
GFR: 60.94 mL/min (ref >60.00)
Glucose, Bld: 164 mg/dL — ABNORMAL HIGH (ref 70–99)
Potassium: 4.5 meq/L (ref 3.5–5.1)
Sodium: 142 meq/L (ref 135–145)
Total Bilirubin: 1 mg/dL (ref 0.2–1.2)
Total Protein: 7.3 g/dL (ref 6.0–8.3)

## 2023-09-20 LAB — HEMOGLOBIN A1C: Hgb A1c MFr Bld: 6.5 % (ref 4.6–6.5)

## 2023-09-20 LAB — LIPID PANEL
Cholesterol: 196 mg/dL (ref 0–200)
HDL: 63.9 mg/dL (ref >39.00)
LDL Cholesterol: 108 mg/dL — ABNORMAL HIGH (ref 0–99)
NonHDL: 131.92
Total CHOL/HDL Ratio: 3
Triglycerides: 120 mg/dL (ref 0.0–149.0)
VLDL: 24 mg/dL (ref 0.0–40.0)

## 2023-09-20 NOTE — Assessment & Plan Note (Signed)
 Update A1C, continue metformin and januvia. Last A1C was at goal.  Lab Results  Component Value Date   HGBA1C 6.6 (H) 06/14/2023   HGBA1C 7.1 (H) 03/15/2023   HGBA1C 7.1 (H) 12/13/2022   Lab Results  Component Value Date   MICROALBUR <0.7 06/23/2023   LDLCALC 94 12/13/2022   CREATININE 0.92 06/14/2023

## 2023-09-20 NOTE — Assessment & Plan Note (Signed)
 Lab Results  Component Value Date   CHOL 190 12/13/2022   HDL 63.70 12/13/2022   LDLCALC 94 12/13/2022   TRIG 166.0 (H) 12/13/2022   CHOLHDL 3 12/13/2022   Stable on lipitor, will update lipid panel.

## 2023-09-20 NOTE — Patient Instructions (Signed)
 VISIT SUMMARY:  Today, you were seen for bilateral shoulder pain, right ear pain, and dizziness. We discussed your symptoms, including the significant pain and weakness in your shoulders, the intermittent ear pain, and the recent severe dizziness. We also reviewed your current medications and upcoming cataract surgery. Additionally, we addressed your general health maintenance needs, including cholesterol screening and bone density testing.  YOUR PLAN:  -BILATERAL SHOULDER PAIN: You have chronic pain in both shoulders, with the left shoulder being more problematic and weak. We will refer you to an orthopedic specialist to evaluate your condition further and discuss potential treatment options, including physical therapy.  -EAR PAIN WITH POSSIBLE EUSTACHIAN TUBE DYSFUNCTION: You have intermittent right ear pain and clear fluid behind your eardrums, which may indicate Eustachian tube dysfunction. We recommend taking Claritin once daily and using Flonase for a couple of weeks to help reduce the fluid and alleviate your symptoms.  -DIZZINESS AND VERTIGO: You have been experiencing severe dizziness and lingering vertigo. This may be related to the fluid in your ears. Taking Claritin and using Flonase may help reduce the fluid and improve your symptoms.  -HYPERTENSION: Your blood pressure is well-controlled with your current medications: amlodipine 10 mg, carvedilol 12.5 mg, and losartan 50 mg. No changes are needed at this time.  -CATARACTS: You have blurriness in your vision due to cataracts, with surgery scheduled for the end of the month. This surgery should improve your vision.  -GENERAL HEALTH MAINTENANCE: Your routine health maintenance is up to date except for cholesterol screening and bone density testing. We will order a cholesterol lab test and a bone density test to assess for osteoporosis. We will also coordinate additional breast imaging as a precaution following your recent  mammogram.  INSTRUCTIONS:  Please follow up with the orthopedic specialist for your shoulder pain evaluation. Take Claritin once daily and use Flonase for a couple of weeks to help with your ear pain and dizziness. Continue taking your current blood pressure medications as prescribed. Attend your scheduled cataract surgery at the end of the month. We will order a cholesterol lab test and a bone density test, and coordinate additional breast imaging. Please schedule these tests at your earliest convenience.

## 2023-09-20 NOTE — Assessment & Plan Note (Signed)
 BP stable on losartan, carvedilol and amlodipine.

## 2023-09-20 NOTE — Assessment & Plan Note (Signed)
 I have ordered an echo several times but it looks like she never scheduled them. Will re-order.

## 2023-09-20 NOTE — Assessment & Plan Note (Signed)
New.  Will refer to orthopedics for further evaluation.

## 2023-09-20 NOTE — Progress Notes (Addendum)
 Subjective:     Patient ID: Nicole Douglas, female    DOB: 24-May-1946, 78 y.o.   MRN: [id number]  Chief Complaint  Patient presents with   Diabetes    Here for follow up   Hypertension    Here for follow up   Dizziness    Patient reports "dealing with vertigo for about a week"    Diabetes Hypoglycemia symptoms include dizziness.  Hypertension  Dizziness    Discussed the use of AI scribe software for clinical note transcription with the patient, who gave verbal consent to proceed.  History of Present Illness  Nicole Douglas is a 78 year old female who presents with bilateral shoulder pain, right ear pain, and dizziness.  She experiences significant bilateral shoulder pain, with the left shoulder being more problematic. There is a notable lack of strength in the left shoulder, necessitating support to lift objects. She has a history of previous shoulder surgeries performed at [facility] region, though she is unsure of the exact details due to the time elapsed since the surgeries.  She has been experiencing severe dizziness over the past week, which has been so intense that she 'can't think.' The dizziness occurs with all movements and is not limited to specific actions like sitting up or turning her head. Although she does not feel dizzy at the moment, she senses that the vertigo is still present. No dizziness today but notes that vertigo is still lingering.  She reports intermittent right ear pain, described as a 'shooting star' type of pain above her temple. The pain is not constant and occurs infrequently. No current ear infections or allergies. No changes in vision, although she notes blurriness, which she attributes to an upcoming cataract surgery on the left eye, as she had the right cataract removed previously.  Her current medications include amlodipine 10 mg, carvedilol 12.5 mg, losartan 50 mg, and Lipitor.  She was sick last week but has since recovered.  She is due for  a bone density test and additional breast imaging after a recent mammogram required further evaluation.     Health Maintenance Due  Topic Date Due   DEXA SCAN  12/12/2022   COVID-19 Vaccine (6 - 2024-25 season) 02/20/2023   Medicare Annual Wellness (AWV)  03/12/2023   FOOT EXAM  06/23/2023    Past Medical History:  Diagnosis Date   Hyperglycemia    Hyperlipemia    Hypertension    Thyroid disease     Past Surgical History:  Procedure Laterality Date   ABDOMINAL HYSTERECTOMY     BACK SURGERY     BIOPSY BREAST     left breast   BREAST BIOPSY Left    benign    ROTATOR CUFF REPAIR     bilateral    Family History  Problem Relation Age of Onset   Hypertension Other        both sides of family   Heart disease Other    Cancer Brother        throat   Heart attack Mother        died at 30   Heart attack Sister        age 49   Prostate cancer Father     Social History   Socioeconomic History   Marital status: Married    Spouse name: Not on file   Number of children: Not on file   Years of education: Not on file   Highest education level: Not  on file  Occupational History   Not on file  Tobacco Use   Smoking status: Never   Smokeless tobacco: Never  Substance and Sexual Activity   Alcohol use: No   Drug use: No   Sexual activity: Not on file  Other Topics Concern   Not on file  Social History Narrative   Retired from [employer]   3 grown children (oldest daughter in [place], Daughter local, Son deceased was murdered)   Married   Enjoys reading, crossword, adult coloring, sewing    No pets   Social Drivers of Corporate investment banker Strain: Low Risk  (03/09/2021)   Overall Financial Resource Strain (CARDIA)    Difficulty of Paying Living Expenses: Not hard at all  Food Insecurity: No Food Insecurity (02/18/2023)   Hunger Vital Sign    Worried About Running Out of Food in the Last Year: Never true    Ran Out of Food in the Last Year: Never true   Transportation Needs: No Transportation Needs (02/18/2023)   PRAPARE - Administrator, Civil Service (Medical): No    Lack of Transportation (Non-Medical): No  Physical Activity: Sufficiently Active (03/09/2021)   Exercise Vital Sign    Days of Exercise per Week: 3 days    Minutes of Exercise per Session: 60 min  Stress: No Stress Concern Present (03/09/2021)   [employer] of Occupational Health - Occupational Stress Questionnaire    Feeling of Stress : Not at all  Social Connections: Moderately Integrated (03/09/2021)   Social Connection and Isolation Panel [NHANES]    Frequency of Communication with Friends and Family: More than three times a week    Frequency of Social Gatherings with Friends and Family: Once a week    Attends Religious Services: More than 4 times per year    Active Member of [employer] or Organizations: No    Attends Banker Meetings: Never    Marital Status: Married  Catering manager Violence: Not At Risk (03/09/2021)   Humiliation, Afraid, Rape, and Kick questionnaire    Fear of Current or Ex-Partner: No    Emotionally Abused: No    Physically Abused: No    Sexually Abused: No    Outpatient Medications Prior to Visit  Medication Sig Dispense Refill   Accu-Chek Softclix Lancets lancets Use as directed to test up to 4 times daily 200 each 12   amLODipine (NORVASC) 10 MG tablet TAKE 1 TABLET BY MOUTH DAILY 100 tablet 1   aspirin 81 MG tablet Take 81 mg by mouth daily.     atorvastatin (LIPITOR) 10 MG tablet TAKE 1 TABLET BY MOUTH DAILY 100 tablet 1   blood glucose meter kit and supplies KIT Dispense based on patient and insurance preference. Use up to four times daily as directed. 1 each 0   Blood Glucose Monitoring Suppl (ACCU-CHEK GUIDE) w/Device KIT Use as directed up to 4 times daily 1 kit 0   Calcium Carbonate (CALTRATE 600 PO) Take by mouth daily.     carvedilol (COREG) 12.5 MG tablet TAKE 1 TABLET BY MOUTH TWICE  DAILY WITH MEALS  200 tablet 1   estradiol (ESTRACE) 0.1 MG/GM vaginal cream Place vaginally.     glucose blood (ACCU-CHEK GUIDE) test strip Use as directed to test blood sugar up to 4 times daily. 200 each 12   levothyroxine (SYNTHROID) 50 MCG tablet TAKE 1 TABLET BY MOUTH ONCE  DAILY EXCEPT TAKE ONE-HALF  TABLET BY MOUTH ON SUNDAYS  94 tablet 2   losartan (COZAAR) 50 MG tablet Take 1 tablet (50 mg total) by mouth in the morning and at bedtime. 200 tablet 1   meclizine (ANTIVERT) 25 MG tablet TAKE 1 TABLET BY MOUTH 3 TIMES  DAILY AS NEEDED FOR DIZZINESS 30 tablet 0   metFORMIN (GLUCOPHAGE) 1000 MG tablet Take 1 tablet (1,000 mg total) by mouth 2 (two) times daily with a meal. 200 tablet 1   omeprazole (PRILOSEC) 40 MG capsule Take 1 capsule (40 mg total) by mouth daily. 100 capsule 0   pioglitazone (ACTOS) 30 MG tablet Take 1 tablet (30 mg total) by mouth daily. Stop taking once you get the Januvia approved 30 tablet 1   sitaGLIPtin (JANUVIA) 100 MG tablet Take 1 tablet (100 mg total) by mouth daily. 30 tablet 1   No facility-administered medications prior to visit.    No Known Allergies  Review of Systems  Neurological:  Positive for dizziness.      See HPI Objective:    Physical Exam Constitutional:      General: She is not in acute distress.    Appearance: Normal appearance. She is well-developed.  HENT:     Head: Normocephalic and atraumatic.     Right Ear: External ear normal. A middle ear effusion is present. Tympanic membrane is not injected, erythematous or bulging.     Left Ear: External ear normal. A middle ear effusion is present. Tympanic membrane is not injected, erythematous or bulging.  Eyes:     General: No scleral icterus. Neck:     Thyroid: No thyromegaly.  Cardiovascular:     Rate and Rhythm: Normal rate and regular rhythm.     Heart sounds: Murmur heard.  Pulmonary:     Effort: Pulmonary effort is normal. No respiratory distress.     Breath sounds: Normal breath sounds. No  wheezing.  Musculoskeletal:     Cervical back: Neck supple.     Comments: Increased shoulder pain with abduction and extension on the left.   Skin:    General: Skin is warm and dry.  Neurological:     Mental Status: She is alert and oriented to person, place, and time.  Psychiatric:        Mood and Affect: Mood normal.        Behavior: Behavior normal.        Thought Content: Thought content normal.        Judgment: Judgment normal.      BP 123/63 (BP Location: Right Arm, Patient Position: Sitting)   Pulse 79   Temp 98.7 F (37.1 C) (Oral)   Resp 16   Ht 5\' 6"  (1.676 m)   Wt 163 lb (73.9 kg)   SpO2 99%   BMI 26.31 kg/m  Wt Readings from Last 3 Encounters:  09/20/23 163 lb (73.9 kg)  06/14/23 169 lb (76.7 kg)  03/15/23 163 lb (73.9 kg)       Assessment & Plan:   Problem List Items Addressed This Visit       Unprioritized   Murmur   I have ordered an echo several times but it looks like she never scheduled them. Will re-order.      Relevant Orders   ECHOCARDIOGRAM COMPLETE   Left shoulder pain - Primary   New. Will refer to orthopedics for further evaluation.       Hyperlipidemia   Lab Results  Component Value Date   CHOL 190 12/13/2022   HDL 63.70 12/13/2022  LDLCALC 94 12/13/2022   TRIG 166.0 (H) 12/13/2022   CHOLHDL 3 12/13/2022   Stable on lipitor, will update lipid panel.      Relevant Orders   Lipid panel   Comp Met (CMET)   HTN (hypertension)   BP stable on losartan, carvedilol and amlodipine.       Eustachian tube dysfunction, bilateral   New.  Trial of claritin and flonase.      Controlled type 2 diabetes mellitus without complication, without long-term current use of insulin (HCC)   Update A1C, continue metformin and januvia. Last A1C was at goal.  Lab Results  Component Value Date   HGBA1C 6.6 (H) 06/14/2023   HGBA1C 7.1 (H) 03/15/2023   HGBA1C 7.1 (H) 12/13/2022   Lab Results  Component Value Date   MICROALBUR <0.7  06/23/2023   LDLCALC 94 12/13/2022   CREATININE 0.92 06/14/2023         Relevant Orders   HgB A1c   Other Visit Diagnoses       Bilateral shoulder pain, unspecified chronicity       Relevant Orders   Ambulatory referral to Orthopedic Surgery     Postmenopausal estrogen deficiency       Relevant Orders   DG Bone Density       I am having Nicole Douglas maintain her aspirin, Calcium Carbonate (CALTRATE 600 PO), estradiol, Accu-Chek Guide, Accu-Chek Guide, Accu-Chek Softclix Lancets, blood glucose meter kit and supplies, levothyroxine, meclizine, sitaGLIPtin, pioglitazone, omeprazole, metFORMIN, losartan, amLODipine, atorvastatin, and carvedilol.  No orders of the defined types were placed in this encounter.

## 2023-09-20 NOTE — Addendum Note (Signed)
 Addended by: Sandford Craze on: 09/20/2023 02:17 PM   Modules accepted: Orders

## 2023-09-20 NOTE — Progress Notes (Addendum)
 Established Patient Office Visit  Subjective   Patient ID: SHAWNYA MAYOR, female    DOB: [birth year]  Age: 78 y.o. MRN: [id number]  Chief Complaint  Patient presents with   Diabetes    Here for follow up   Hypertension    Here for follow up   Dizziness    Patient reports "dealing with vertigo for about a week"    78 yo here for routine follow-up of hypertension, diabetes, and right ear pain. Patient states she continues to experience pain in the right ear and right temporal area. States onset was approximately 3-4 months ago. She also reports that she has a sensation like "something is moving around in there." Patient reports she is also experiencing dizziness that she attributes to the ear pain. She has been taking meclizine that seems to help; however, the dizziness seems to be slightly worse this week.   Patient also c/o bilateral shoulder pain with left should pain greater than the right shoulder x 1 week. She reports limited abduction and strength. Denies any new injury; however, she has had bilateral rotator cuff repairs in the past. She states these symptoms are similar to when she had her rotator cuff injuries. She states that she also feels like she has decreased strength on the left side. ShAlBilateral shoulder pain x 1 week (L>R).  Patient reports that her blood sugars are fluctuating at home, and she is unable to determine the cause. Her blood sugars are running between 90-150 mg/dL with a recent max of 952 mg/dL. She states that her blood pressure has been consistently in the normal range.    Diabetes Hypoglycemia symptoms include dizziness.  Hypertension  Dizziness   Review of Systems  Neurological:  Positive for dizziness.  See HPI   Objective:     BP 123/63 (BP Location: Right Arm, Patient Position: Sitting)   Pulse 79   Temp 98.7 F (37.1 C) (Oral)   Resp 16   Ht 5\' 6"  (1.676 m)   Wt 163 lb (73.9 kg)   SpO2 99%   BMI 26.31 kg/m    Physical  Exam Vitals reviewed.  Constitutional:      General: She is not in acute distress.    Appearance: Normal appearance. She is not ill-appearing.  HENT:     Head: Normocephalic and atraumatic.     Right Ear: External ear normal.     Left Ear: External ear normal.     Mouth/Throat:     Mouth: Mucous membranes are moist.     Pharynx: Oropharynx is clear.  Eyes:     Extraocular Movements: Extraocular movements intact.     Conjunctiva/sclera: Conjunctivae normal.     Pupils: Pupils are equal, round, and reactive to light.  Cardiovascular:     Rate and Rhythm: Normal rate and regular rhythm.     Pulses: Normal pulses.     Heart sounds: Normal heart sounds.  Pulmonary:     Effort: Pulmonary effort is normal.     Breath sounds: Normal breath sounds.  Musculoskeletal:     Right shoulder: Tenderness present.     Left shoulder: Tenderness present. Decreased range of motion.     Cervical back: Normal range of motion and neck supple.     Comments: Left shoulder - limited abduction and tenderness to palpation to anterior shoulder  Skin:    General: Skin is warm and dry.     Capillary Refill: Capillary refill takes less than 2 seconds.  Neurological:  Mental Status: She is alert and oriented to person, place, and time.  Psychiatric:        Mood and Affect: Mood normal.        Behavior: Behavior normal.      Assessment & Plan:   -Hypertension - stable on amlodipine, carvedilol, and losartan. Will continue same.   -Diabetes mellitus - will recheck CMP and A1C today. Continue metformin and sitagliptin.   -Shoulder pain - refer to orthopedics for evaluation.   -Ear pain - refer to ENT. Continue meclizine for dizziness.    Cristopher Peru, RN

## 2023-09-20 NOTE — Assessment & Plan Note (Signed)
 New.  Trial of claritin and flonase.

## 2023-09-20 NOTE — Telephone Encounter (Signed)
 Please advise pt that I would like her to complete an ultrasound of her heart.  I again heard a murmur again today and I see that she hasn't followed through with the previous echo's I have ordered in the past. I want to make sure her heart valves look ok.

## 2023-09-21 ENCOUNTER — Encounter: Payer: Self-pay | Admitting: Family

## 2023-09-21 NOTE — Telephone Encounter (Signed)
 Patient notified of this information and she verbalized understanding. She was advised she will get a call to schedule this.

## 2023-09-27 ENCOUNTER — Ambulatory Visit: Admitting: Physician Assistant

## 2023-09-29 ENCOUNTER — Ambulatory Visit
Admission: RE | Admit: 2023-09-29 | Discharge: 2023-09-29 | Disposition: A | Discharge status: Home / Self Care | Source: Ambulatory Visit | Attending: Family | Admitting: Family

## 2023-09-29 DIAGNOSIS — R928 Other abnormal and inconclusive findings on diagnostic imaging of breast: Secondary | ICD-10-CM | POA: Diagnosis not present

## 2023-10-18 ENCOUNTER — Other Ambulatory Visit: Payer: Medicare Other | Admitting: Pharmacist

## 2023-10-18 ENCOUNTER — Telehealth: Payer: Self-pay | Admitting: Pharmacist

## 2023-10-18 DIAGNOSIS — H40003 Preglaucoma, unspecified, bilateral: Secondary | ICD-10-CM | POA: Diagnosis not present

## 2023-10-18 DIAGNOSIS — Z961 Presence of intraocular lens: Secondary | ICD-10-CM | POA: Diagnosis not present

## 2023-10-18 DIAGNOSIS — E119 Type 2 diabetes mellitus without complications: Secondary | ICD-10-CM | POA: Diagnosis not present

## 2023-10-18 DIAGNOSIS — H25011 Cortical age-related cataract, right eye: Secondary | ICD-10-CM | POA: Diagnosis not present

## 2023-10-18 DIAGNOSIS — H2511 Age-related nuclear cataract, right eye: Secondary | ICD-10-CM | POA: Diagnosis not present

## 2023-10-18 DIAGNOSIS — H52203 Unspecified astigmatism, bilateral: Secondary | ICD-10-CM | POA: Diagnosis not present

## 2023-10-18 DIAGNOSIS — H524 Presbyopia: Secondary | ICD-10-CM | POA: Diagnosis not present

## 2023-10-18 DIAGNOSIS — H35363 Drusen (degenerative) of macula, bilateral: Secondary | ICD-10-CM | POA: Diagnosis not present

## 2023-10-18 LAB — HM DIABETES EYE EXAM

## 2023-10-18 NOTE — Progress Notes (Signed)
 10/18/23  Name: Nicole Douglas MRN: [id number] DOB: 26-Apr-1946  Chief Complaint  Patient presents with   Medication Management   Diabetes    Nicole Douglas is a 78 y.o. year old female. Spoke with patient today for a phone visit.    They were referred to the pharmacist by their Case Management Team  for assistance in managing diabetes and medication access.    Subjective:  Medication Access/Adherence  Patient reports affordability concerns with their medications: No  - cost of Januvia  was >$ 350 but she has been approved for [employer] medication assistance program thru 06/20/2024.   Patient reports access/transportation concerns to their pharmacy: No  Patient reports adherence concerns with their medications:  No   Type 2 DM:  Current diabetes therapy - metformin  1000mg  twice a day and Januvia  100mg  daily  She has taken pioglitazone  in past when she was not able to get Januvia  due to cost.   Last refill for metformin  was for 100 DS on 08/15/2023  Checking blood glucose once daily.   Hyperlipidemia:  Current therapy: atorvastatin  10mg  - last refill was 09/19/2023 for 100 day supply  Hypertension:  Current therapy for blood pressure:  amlodipine  10mg  daily, losartan  50mg  - takes 1 tablet twice a day, carvedilol  12.5mg  twice a day.   Amlodipine  10mg  - LR was 06/05/2024 for 100 day supply. Updated Rx was sent 08/16/2023 to [facility]. Per patient she received a delivery of amlodpine recently. She read label to me and reported she has at least a month of medications on hand.   Losartan  last refilled 08/02/2023 for 100 day supply.  Carvedilol  last refilled 09/19/2023 for 100 day supply.     BP Readings from Last 3 Encounters:  09/20/23 123/63  06/14/23 133/65  03/15/23 128/61     Objective:  Lab Results  Component Value Date   HGBA1C 6.5 09/20/2023    Lab Results  Component Value Date   CREATININE 0.90 09/20/2023   BUN 18 09/20/2023   NA 142 09/20/2023   K 4.5  09/20/2023   CL 103 09/20/2023   CO2 29 09/20/2023    Lab Results  Component Value Date   CHOL 196 09/20/2023   HDL 63.90 09/20/2023   LDLCALC 108 (H) 09/20/2023   TRIG 120.0 09/20/2023   CHOLHDL 3 09/20/2023    Medications Reviewed Today     Reviewed by Cecilie Coffee, RPH-CPP (Pharmacist) on 10/18/23 at 1412  Med List Status: <None>   Medication Order Taking? Sig Documenting Provider Last Dose Status Informant  Accu-Chek Softclix Lancets lancets [id number] Yes Use as directed to test up to 4 times daily Dorrene Gaucher, NP Taking Active   amLODipine  (NORVASC ) 10 MG tablet [id number] Yes TAKE 1 TABLET BY MOUTH DAILY O'Sullivan, Melissa, NP Taking Active   aspirin 81 MG tablet [id number] Yes Take 81 mg by mouth daily. [provider] Taking Active   atorvastatin  (LIPITOR) 10 MG tablet [id number] Yes TAKE 1 TABLET BY MOUTH DAILY O'Sullivan, Melissa, NP Taking Active   blood glucose meter kit and supplies KIT [id number] Yes Dispense based on patient and insurance preference. Use up to four times daily as directed. Dorrene Gaucher, NP Taking Active   Blood Glucose Monitoring Suppl (ACCU-CHEK GUIDE) w/Device KIT [id number] Yes Use as directed up to 4 times daily Dorrene Gaucher, NP Taking Active   Calcium  Carbonate (CALTRATE 600 PO) [id number] Yes Take by mouth daily. [provider] Taking Active   carvedilol  (COREG ) 12.5 MG tablet [id number] Yes  TAKE 1 TABLET BY MOUTH TWICE  DAILY WITH MEALS Dorrene Gaucher, NP Taking Active   estradiol  (ESTRACE ) 0.1 MG/GM vaginal cream [id number] Yes Place vaginally. [provider] Taking Active   glucose blood (ACCU-CHEK GUIDE) test strip [id number] Yes Use as directed to test blood sugar up to 4 times daily. Dorrene Gaucher, NP Taking Active   levothyroxine  (SYNTHROID ) 50 MCG tablet [id number] Yes TAKE 1 TABLET BY MOUTH ONCE  DAILY EXCEPT TAKE ONE-HALF  TABLET BY MOUTH ON Trisha Furlong, NP Taking Active    losartan  (COZAAR ) 50 MG tablet [id number] Yes Take 1 tablet (50 mg total) by mouth in the morning and at bedtime. Dorrene Gaucher, NP Taking Active   meclizine  (ANTIVERT ) 25 MG tablet [id number] Yes TAKE 1 TABLET BY MOUTH 3 TIMES  DAILY AS NEEDED FOR DIZZINESS O'Sullivan, Melissa, NP Taking Active   metFORMIN  (GLUCOPHAGE ) 1000 MG tablet [id number] Yes Take 1 tablet (1,000 mg total) by mouth 2 (two) times daily with a meal. Dorrene Gaucher, NP Taking Active   omeprazole  (PRILOSEC) 40 MG capsule [id number] Yes Take 1 capsule (40 mg total) by mouth daily. Dorrene Gaucher, NP Taking Active   sitaGLIPtin  (JANUVIA ) 100 MG tablet [id number] Yes Take 1 tablet (100 mg total) by mouth daily. Neda Balk, MD Taking Active            Med Note [place], [place]   Thu Jul 21, 2023 11:52 AM) Approved for [employer] medication assistance program thru 06/20/2024              Assessment/Plan:   Diabetes: Last A1c was at goal of < 7.0% -Called [employer] medication assistance program to request refill. Patient should received in 7 to 10 business days.  - Continue to take metformin  - next refill not due until May 2025.   Hyperlipidemia: Last LDL was not at goal of < 100 - Continue atorvastatin  10mg  daily. If next LDL not at goal consider increasing atorvastatin  to 40mg  or add ezetimibe.   Hypertension: Last office blood pressure was at goal   - Continue losartan , carvedilol  and amlodipine   - Discussed adherence. Patient is aware she has refills available on amlodipine  when needed at [facility].   Follow Up Plan: 2 to 3 months   Cecilie Coffee, PharmD Clinical Pharmacist [facility]

## 2023-10-18 NOTE — Telephone Encounter (Signed)
 Patient called back. Phone visit completed - see notes.

## 2023-10-18 NOTE — Telephone Encounter (Signed)
 Attempt was made to contact patient by phone today for follow up by Clinical Pharmacist regarding diabetes / medication assistance. .  Unable to reach patient. LM on VM with my contact number [phone].

## 2023-10-31 ENCOUNTER — Ambulatory Visit (HOSPITAL_BASED_OUTPATIENT_CLINIC_OR_DEPARTMENT_OTHER)

## 2023-11-02 DIAGNOSIS — H2511 Age-related nuclear cataract, right eye: Secondary | ICD-10-CM | POA: Diagnosis not present

## 2023-11-02 DIAGNOSIS — H25811 Combined forms of age-related cataract, right eye: Secondary | ICD-10-CM | POA: Diagnosis not present

## 2023-11-07 ENCOUNTER — Telehealth: Payer: Self-pay | Admitting: *Deleted

## 2023-11-07 NOTE — Progress Notes (Signed)
 Complex Care Management Care Guide Note  11/07/2023 Name: Nicole Douglas MRN: [id number] DOB: 12-Sep-1945  Nicole Douglas is a 78 y.o. year old female who is a primary care patient of Dorrene Gaucher, NP and is actively engaged with the care management team. I reached out to Napolean Backbone by phone today to assist with re-scheduling  with the RN Case Manager.  Follow up plan: Unsuccessful telephone outreach attempt made. A HIPAA compliant phone message was left for the patient providing contact information and requesting a return call.  Kandis Ormond, CMA [place]  [facility], [facility] Guide Direct Dial: [phone]  Fax: [phone] Website: [email or web address]

## 2023-11-08 NOTE — Progress Notes (Signed)
 Complex Care Management Care Guide Note  11/08/2023 Name: Nicole Douglas MRN: [id number] DOB: [birth year]  Nicole Douglas is a 78 y.o. year old female who is a primary care patient of O'Sullivan, Melissa, NP and is actively engaged with the care management team. I reached out to Napolean Backbone by phone today to assist with re-scheduling  with the RN Case Manager.  Follow up plan: Unsuccessful telephone outreach attempt made. A HIPAA compliant phone message was left for the patient providing contact information and requesting a return call. No further outreach attempts will be made due to inability to maintain patient contact.   Kandis Ormond, CMA [place]  [facility] Guide Direct Dial: [phone]  Fax: [phone] Website: [email or web address]

## 2023-11-11 ENCOUNTER — Other Ambulatory Visit: Payer: Self-pay | Admitting: Family

## 2023-11-11 DIAGNOSIS — K219 Gastro-esophageal reflux disease without esophagitis: Secondary | ICD-10-CM

## 2023-11-21 ENCOUNTER — Other Ambulatory Visit (HOSPITAL_BASED_OUTPATIENT_CLINIC_OR_DEPARTMENT_OTHER): Payer: Self-pay

## 2023-11-21 ENCOUNTER — Other Ambulatory Visit: Payer: Self-pay | Admitting: Family

## 2023-11-21 MED ORDER — GLUCOSE BLOOD VI STRP
1.0000 | ORAL_STRIP | Freq: Four times a day (QID) | 12 refills | Status: AC
  Filled 2023-11-21: qty 200, 50d supply, fill #0
  Filled 2024-03-26: qty 200, 50d supply, fill #1
  Filled 2024-07-10: qty 200, 50d supply, fill #2

## 2023-12-09 DIAGNOSIS — H524 Presbyopia: Secondary | ICD-10-CM | POA: Diagnosis not present

## 2023-12-09 DIAGNOSIS — H52203 Unspecified astigmatism, bilateral: Secondary | ICD-10-CM | POA: Diagnosis not present

## 2023-12-09 DIAGNOSIS — H5203 Hypermetropia, bilateral: Secondary | ICD-10-CM | POA: Diagnosis not present

## 2023-12-11 ENCOUNTER — Other Ambulatory Visit: Payer: Self-pay | Admitting: Family

## 2023-12-20 ENCOUNTER — Telehealth: Payer: Self-pay | Admitting: Family

## 2023-12-20 NOTE — Telephone Encounter (Signed)
 Copied from CRM [id number]. Topic: Medicare AWV >> Dec 20, 2023  2:03 PM Nathanel DEL wrote: Reason for CRM: LVM 12/20/2023 to schedule AWV. Please schedule Virtual or Telehealth visits ONLY.   Nathanel Paschal; Care Guide Ambulatory Clinical Support [place] [facility] Direct Dial: [phone]

## 2024-01-10 ENCOUNTER — Ambulatory Visit (HOSPITAL_BASED_OUTPATIENT_CLINIC_OR_DEPARTMENT_OTHER)
Admission: RE | Admit: 2024-01-10 | Discharge: 2024-01-10 | Disposition: A | Discharge status: Home / Self Care | Source: Ambulatory Visit | Attending: Family | Admitting: Family

## 2024-01-10 DIAGNOSIS — R011 Cardiac murmur, unspecified: Secondary | ICD-10-CM | POA: Insufficient documentation

## 2024-01-10 LAB — ECHOCARDIOGRAM COMPLETE
AR max vel: 1.93 cm2
AV Area VTI: 1.97 cm2
AV Area mean vel: 1.89 cm2
AV Mean grad: 5 mmHg
AV Peak grad: 9.5 mmHg
Ao pk vel: 1.54 m/s
Area-P 1/2: 3.19 cm2
Calc EF: 65.2 %
S' Lateral: 2.2 cm
Single Plane A2C EF: 67.7 %
Single Plane A4C EF: 61.9 %

## 2024-01-11 ENCOUNTER — Ambulatory Visit: Payer: Self-pay | Admitting: Family

## 2024-01-12 ENCOUNTER — Encounter: Payer: Self-pay | Admitting: Pharmacist

## 2024-01-12 NOTE — Progress Notes (Signed)
 Pharmacy Quality Measure Review  This patient is appearing on a report for being at risk of failing the adherence measure for cholesterol (statin), diabetes, and hypertension (ACEi/ARB) medications this calendar year.   Medication: metformin   Last fill date: 08/15/2023 for 100 day supply  Medication: losartan   Last fill date: 08/02/2023 for 100 day supply  Medication: atorvastatin    Last fill date: 12/12/2023 for 100 day supply  Reviewed her refill history - metformin  has not been filled since 2/24 but there is a refill available at [facility].  Losartan  and simvastatin were actually filled 12/12/2023 for 100 day supply.   Left voicemail for patient to return my call at their convenience. and MyChart message sent to patient.  Madelin Ray, PharmD Clinical Pharmacist [facility] Primary Care  Population Health [phone]

## 2024-01-23 ENCOUNTER — Telehealth: Payer: Self-pay | Admitting: Pharmacist

## 2024-01-23 NOTE — Progress Notes (Signed)
 Pharmacy Quality Measure Review  This patient is appearing on a report for being at risk of failing the adherence measure for cholesterol (statin), diabetes, and hypertension (ACEi/ARB) medications this calendar year.   Medication: metformin   Last fill date: 08/15/2023 for 100 day supply  Reviewed her refill history - metformin  has not been filled since 2/24 but there is a refill available at [facility].  Left a message for patient last week. Today she asks if I could request refill for metfomrin for her from [facility].  Called [facility] and metformin  was filled for 100 day supply last week  - 01/15/2024. Patient should received in the next 1 to 2 days.   Madelin Ray, PharmD Clinical Pharmacist [facility] Primary Care  Population Health [phone]

## 2024-01-27 ENCOUNTER — Other Ambulatory Visit (HOSPITAL_BASED_OUTPATIENT_CLINIC_OR_DEPARTMENT_OTHER): Payer: Self-pay

## 2024-01-30 ENCOUNTER — Telehealth: Payer: Self-pay | Admitting: Pharmacist

## 2024-01-30 ENCOUNTER — Telehealth: Payer: Self-pay

## 2024-01-30 NOTE — Telephone Encounter (Signed)
 Reached out to patient about a refill request for Januvia  did not get an answer - left HIPAA compliant v/m to return call for assistance. I spoke with [employer] and called in her refill for her and Januvia  will be shipped to her home address in 7-10 business days and she has 2 refills left-

## 2024-01-30 NOTE — Telephone Encounter (Signed)
 Patient left a message on VM of Clinical Pharmacist regarding refill for Januvia . She had contacted [facility] to request a refill but they did not have a refill available. She read her bottle and last was filled by [employer] patient assistance program. From the message she is unsure how to reorder. Will have Med Assist Team reach out to her to help with reordering.

## 2024-02-10 ENCOUNTER — Other Ambulatory Visit: Payer: Self-pay | Admitting: Pharmacist

## 2024-02-10 ENCOUNTER — Telehealth: Payer: Self-pay | Admitting: Pharmacist

## 2024-02-10 NOTE — Telephone Encounter (Signed)
 Attempt was made to contact patient by phone today for follow up by Clinical Pharmacist regarding medications and diabetes.  Unable to reach patient. LM on VM with my contact number [phone].

## 2024-02-19 ENCOUNTER — Other Ambulatory Visit: Payer: Self-pay | Admitting: Family

## 2024-02-28 ENCOUNTER — Other Ambulatory Visit: Payer: Self-pay | Admitting: Family

## 2024-02-28 ENCOUNTER — Ambulatory Visit: Payer: Self-pay

## 2024-02-28 DIAGNOSIS — N952 Postmenopausal atrophic vaginitis: Secondary | ICD-10-CM

## 2024-02-28 NOTE — Telephone Encounter (Signed)
   FYI Only or Action Required?: Action required by provider: request for appointment.  Patient was last seen in primary care on 09/20/2023 by Daryl Setter, NP.  Called Nurse Triage reporting bruise.  Symptoms began several months ago.  Interventions attempted: Nothing.  Symptoms are: unchanged.  Triage Disposition: Home Care  Patient/caregiver understands and will follow disposition?: YesCopied from CRM #[id number]. Topic: Clinical - Red Word Triage >> Feb 28, 2024  4:00 PM Rosina BIRCH wrote: Red Word that prompted transfer to Nurse Triage: spot under her right breast that looks like a bruise Reason for Disposition  [1] Purple or blood-colored LOCALIZED rash AND [2] from injury or friction  Answer Assessment - Initial Assessment Questions Scheduled appt 03/07/24. Advised call back if symptoms worsen.  1. APPEARANCE of RASH: What does the rash look like? What color is it? (Note: It is difficult to assess rash color in people with darker-colored skin. When this situation occurs, simply ask the caller to describe what they see.)     On the bra line, itches from time to time, purplish, flat, it's just there, been there for 3 months 2. SIZE: How big are the spots? (e.g., inches, cm; or compare to size of pinhead, tip of pen, eraser, pea)      Quarter size 3. LOCATION: Where is the rash located?     Right breast, under 4. ONSET: When did the rash begin?     3 months 5. FEVER: Do you have a fever? If Yes, ask: What is your temperature, how was it measured, and when did it start?     no 6. CAUSE: What do you think is causing the rash?     no 7. MEDICAL HISTORY: Do you have any medical problems that can cause easy bruising or bleeding? (e.g., leukemia, liver disease, recent chemotherapy)     no 8. MEDICINES: Do you take any medicines which thin the blood such as: aspirin, heparin, ibuprofen  (NSAIDS), Plavix, or Coumadin?     no 9. OTHER SYMPTOMS: Do you have any other  symptoms? (e.g., headache, dizziness, vomiting, sore throat, joint pain, bleeding)     denies  Protocols used: Rash - Purple Spots or Dots-A-AH

## 2024-02-28 NOTE — Telephone Encounter (Unsigned)
 Copied from CRM #[id number]. Topic: Clinical - Medication Refill >> Feb 28, 2024  3:58 PM Rosina D wrote: Medication: estradiol  (ESTRACE ) 0.1 MG/GM vaginal cream  Has the patient contacted their pharmacy? Yes (Agent: If no, request that the patient contact the pharmacy for the refill. If patient does not wish to contact the pharmacy document the reason why and proceed with request.) (Agent: If yes, when and what did the pharmacy advise?)  This is the patient's preferred pharmacy:  [id number] [place] - [address] AT [facility] [address] [place] [address] Phone: [phone] Fax: [phone]  Is this the correct pharmacy for this prescription? Yes If no, delete pharmacy and type the correct one.   Has the prescription been filled recently? No  Is the patient out of the medication? Yes  Has the patient been seen for an appointment in the last year OR does the patient have an upcoming appointment? Yes  Can we respond through MyChart? Yes  Agent: Please be advised that Rx refills may take up to 3 business days. We ask that you follow-up with your pharmacy.

## 2024-02-29 MED ORDER — ESTRADIOL 0.1 MG/GM VA CREA: 1.0000 g | TOPICAL_CREAM | VAGINAL | 1 refills | Status: AC

## 2024-03-07 ENCOUNTER — Ambulatory Visit: Payer: Self-pay | Admitting: Family

## 2024-03-07 ENCOUNTER — Ambulatory Visit (INDEPENDENT_AMBULATORY_CARE_PROVIDER_SITE_OTHER): Admitting: Family

## 2024-03-07 ENCOUNTER — Other Ambulatory Visit (HOSPITAL_BASED_OUTPATIENT_CLINIC_OR_DEPARTMENT_OTHER): Payer: Self-pay

## 2024-03-07 ENCOUNTER — Ambulatory Visit (HOSPITAL_BASED_OUTPATIENT_CLINIC_OR_DEPARTMENT_OTHER)
Admission: RE | Admit: 2024-03-07 | Discharge: 2024-03-07 | Disposition: A | Discharge status: Home / Self Care | Source: Ambulatory Visit | Attending: Family | Admitting: Family

## 2024-03-07 VITALS — BP 151/67 | HR 75 | Temp 98.0°F | Resp 16 | Ht 66.0 in | Wt 161.0 lb

## 2024-03-07 DIAGNOSIS — R21 Rash and other nonspecific skin eruption: Secondary | ICD-10-CM | POA: Diagnosis not present

## 2024-03-07 DIAGNOSIS — Z23 Encounter for immunization: Secondary | ICD-10-CM | POA: Diagnosis not present

## 2024-03-07 DIAGNOSIS — E119 Type 2 diabetes mellitus without complications: Secondary | ICD-10-CM | POA: Diagnosis not present

## 2024-03-07 DIAGNOSIS — I10 Essential (primary) hypertension: Secondary | ICD-10-CM | POA: Diagnosis not present

## 2024-03-07 DIAGNOSIS — M79671 Pain in right foot: Secondary | ICD-10-CM | POA: Diagnosis not present

## 2024-03-07 DIAGNOSIS — M7989 Other specified soft tissue disorders: Secondary | ICD-10-CM | POA: Diagnosis not present

## 2024-03-07 LAB — MICROALBUMIN / CREATININE URINE RATIO
Creatinine,U: 92.3 mg/dL
Microalb Creat Ratio: 10.3 mg/g (ref 0.0–30.0)
Microalb, Ur: 1 mg/dL (ref 0.0–1.9)

## 2024-03-07 MED ORDER — CLOTRIMAZOLE-BETAMETHASONE 1-0.05 % EX CREA
1.0000 | TOPICAL_CREAM | Freq: Every day | CUTANEOUS | 0 refills | Status: AC
  Filled 2024-03-07: qty 30, 30d supply, fill #0

## 2024-03-07 NOTE — Progress Notes (Signed)
 Subjective:     Patient ID: Nicole Douglas, female    DOB: 1946-03-01, 78 y.o.   MRN: [id number]  Chief Complaint  Patient presents with   Skin Discoloration    Patient reports 2 purple spots in torso    Foot Swelling    Patient complains of right foot swelling     HPI  Discussed the use of AI scribe software for clinical note transcription with the patient, who gave verbal consent to proceed.  History of Present Illness  Nicole Douglas is a 78 year old female who presents with purplish skin discoloration and right-sided foot swelling.  Purplish spots have been present on her skin for approximately three months, occasionally causing itching, with no improvement noted. There are no other skin changes or rashes.  Right-sided foot swelling began after she stepped on a brick and rolled her ankle over a month ago. Tenderness and swelling persist, especially on the top of her foot, with swelling extending downwards.       Health Maintenance Due  Topic Date Due   Diabetic kidney evaluation - Urine ACR  Never done   DEXA SCAN  12/12/2022   Medicare Annual Wellness (AWV)  03/12/2023   FOOT EXAM  06/23/2023   COVID-19 Vaccine (6 - 2025-26 season) 02/20/2024    Past Medical History:  Diagnosis Date   Hyperglycemia    Hyperlipemia    Hypertension    Thyroid  disease     Past Surgical History:  Procedure Laterality Date   ABDOMINAL HYSTERECTOMY     BACK SURGERY     BIOPSY BREAST     left breast   BREAST BIOPSY Left    benign    BREAST EXCISIONAL BIOPSY Left 1973   ROTATOR CUFF REPAIR     bilateral    Family History  Problem Relation Age of Onset   Hypertension Other        both sides of family   Heart disease Other    Cancer Brother        throat   Heart attack Mother        died at 24   Heart attack Sister        age 75   Prostate cancer Father     Social History   Socioeconomic History   Marital status: Married    Spouse name: Not on file   Number of  children: Not on file   Years of education: Not on file   Highest education level: Not on file  Occupational History   Not on file  Tobacco Use   Smoking status: Never   Smokeless tobacco: Never  Substance and Sexual Activity   Alcohol use: No   Drug use: No   Sexual activity: Not on file  Other Topics Concern   Not on file  Social History Narrative   Retired from [employer]   3 grown children (oldest daughter in [place], Daughter local, Son deceased was murdered)   Married   Enjoys reading, crossword, adult coloring, sewing    No pets   Social Drivers of Corporate investment banker Strain: Low Risk  (03/09/2021)   Overall Financial Resource Strain (CARDIA)    Difficulty of Paying Living Expenses: Not hard at all  Food Insecurity: No Food Insecurity (02/18/2023)   Hunger Vital Sign    Worried About Running Out of Food in the Last Year: Never true    Ran Out of Food in the Last Year:  Never true  Transportation Needs: No Transportation Needs (02/18/2023)   PRAPARE - Administrator, Civil Service (Medical): No    Lack of Transportation (Non-Medical): No  Physical Activity: Sufficiently Active (03/09/2021)   Exercise Vital Sign    Days of Exercise per Week: 3 days    Minutes of Exercise per Session: 60 min  Stress: No Stress Concern Present (03/09/2021)   [employer] of Occupational Health - Occupational Stress Questionnaire    Feeling of Stress : Not at all  Social Connections: Moderately Integrated (03/09/2021)   Social Connection and Isolation Panel    Frequency of Communication with Friends and Family: More than three times a week    Frequency of Social Gatherings with Friends and Family: Once a week    Attends Religious Services: More than 4 times per year    Active Member of [employer] or Organizations: No    Attends Banker Meetings: Never    Marital Status: Married  Catering manager Violence: Not At Risk (03/09/2021)   Humiliation, Afraid, Rape,  and Kick questionnaire    Fear of Current or Ex-Partner: No    Emotionally Abused: No    Physically Abused: No    Sexually Abused: No    Outpatient Medications Prior to Visit  Medication Sig Dispense Refill   Accu-Chek Softclix Lancets lancets Use as directed to test up to 4 times daily 200 each 12   amLODipine  (NORVASC ) 10 MG tablet TAKE 1 TABLET BY MOUTH DAILY 100 tablet 1   aspirin 81 MG tablet Take 81 mg by mouth daily.     atorvastatin  (LIPITOR) 10 MG tablet Take 1 tablet (10 mg total) by mouth daily. 90 tablet 0   blood glucose meter kit and supplies KIT Dispense based on patient and insurance preference. Use up to four times daily as directed. 1 each 0   Blood Glucose Monitoring Suppl (ACCU-CHEK GUIDE) w/Device KIT Use as directed up to 4 times daily 1 kit 0   Calcium  Carbonate (CALTRATE 600 PO) Take by mouth daily.     carvedilol  (COREG ) 12.5 MG tablet Take 1 tablet (12.5 mg total) by mouth 2 (two) times daily with a meal. 180 tablet 0   estradiol  (ESTRACE ) 0.1 MG/GM vaginal cream Place 1 g vaginally 2 (two) times a week. 42.5 g 1   glucose blood test strip Use as directed to test blood sugar up to 4 times daily. 200 each 12   levothyroxine  (SYNTHROID ) 50 MCG tablet TAKE 1 TABLET BY MOUTH ONCE  DAILY EXCEPT TAKE ONE-HALF  TABLET BY MOUTH ON SUNDAYS 94 tablet 1   losartan  (COZAAR ) 50 MG tablet Take 1 tablet (50 mg total) by mouth in the morning and at bedtime. 180 tablet 0   meclizine  (ANTIVERT ) 25 MG tablet TAKE 1 TABLET BY MOUTH 3 TIMES  DAILY AS NEEDED FOR DIZZINESS 30 tablet 0   metFORMIN  (GLUCOPHAGE ) 1000 MG tablet Take 1 tablet (1,000 mg total) by mouth 2 (two) times daily with a meal. 200 tablet 1   omeprazole  (PRILOSEC) 40 MG capsule TAKE 1 CAPSULE BY MOUTH DAILY 100 capsule 1   sitaGLIPtin  (JANUVIA ) 100 MG tablet Take 1 tablet (100 mg total) by mouth daily. 30 tablet 1   No facility-administered medications prior to visit.    No Known Allergies  ROS     Objective:     Physical Exam Constitutional:      General: She is not in acute distress.    Appearance: Normal  appearance. She is well-developed.  HENT:     Head: Normocephalic and atraumatic.     Right Ear: External ear normal.     Left Ear: External ear normal.  Eyes:     General: No scleral icterus. Neck:     Thyroid : No thyromegaly.  Cardiovascular:     Rate and Rhythm: Normal rate and regular rhythm.     Heart sounds: Normal heart sounds. No murmur heard. Pulmonary:     Effort: Pulmonary effort is normal. No respiratory distress.     Breath sounds: Normal breath sounds. No wheezing.  Musculoskeletal:     Cervical back: Neck supple.     Comments: Mild swelling left dorsal foot  Skin:    General: Skin is warm and dry.     Comments: 2 hyperpigmented patches noted on trunk  Neurological:     Mental Status: She is alert and oriented to person, place, and time.  Psychiatric:        Mood and Affect: Mood normal.        Behavior: Behavior normal.        Thought Content: Thought content normal.        Judgment: Judgment normal.        BP (!) 151/67 (BP Location: Right Arm, Patient Position: Sitting, Cuff Size: Normal)   Pulse 75   Temp 98 F (36.7 C) (Oral)   Resp 16   Ht 5' 6 (1.676 m)   Wt 161 lb (73 kg)   SpO2 98%   BMI 25.99 kg/m  Wt Readings from Last 3 Encounters:  03/07/24 161 lb (73 kg)  09/20/23 163 lb (73.9 kg)  06/14/23 169 lb (76.7 kg)       Assessment & Plan:   Problem List Items Addressed This Visit       Unprioritized   Skin rash   Eczema vs. Fungal cause. Trial of lotrisone  cream.       Relevant Medications   clotrimazole -betamethasone  (LOTRISONE ) cream   HTN (hypertension)   BP elevated but has been controlled recently. Will plan to repeat at her follow up visit in 1 month. No changes to meds at this time.      Controlled type 2 diabetes mellitus without complication, without long-term current use of insulin (HCC)   Relevant Orders   Urine  Microalbumin w/creat. ratio   Other Visit Diagnoses       Needs flu shot    -  Primary   Relevant Orders   Flu vaccine HIGH DOSE PF(Fluzone Trivalent) (Completed)     Right foot pain       Relevant Orders   DG Foot Complete Right       I am having Earnie LOIS [place] start on clotrimazole -betamethasone . I am also having her maintain her aspirin, Calcium  Carbonate (CALTRATE 600 PO), Accu-Chek Guide, Accu-Chek Softclix Lancets, blood glucose meter kit and supplies, meclizine , sitaGLIPtin , metFORMIN , amLODipine , omeprazole , glucose blood, levothyroxine , carvedilol , losartan , atorvastatin , and estradiol .  Meds ordered this encounter  Medications   clotrimazole -betamethasone  (LOTRISONE ) cream    Sig: Apply 1 Application topically daily.    Dispense:  30 g    Refill:  0    Supervising Provider:   DOMENICA BLACKBIRD A [[id number]]

## 2024-03-07 NOTE — Assessment & Plan Note (Signed)
 BP elevated but has been controlled recently. Will plan to repeat at her follow up visit in 1 month. No changes to meds at this time.

## 2024-03-07 NOTE — Patient Instructions (Signed)
 VISIT SUMMARY:  Today, we addressed your right foot and ankle pain and swelling, a skin rash with purplish spots, and your diabetes management. We also discussed and administered your flu shot.  YOUR PLAN:  RIGHT FOOT AND ANKLE PAIN AND SWELLING: You have persistent pain and swelling in your right foot and ankle after an injury. -We will order an X-ray of your right foot and ankle to check for any fractures or bone chips.  SKIN RASH WITH PURPLISH SPOTS: You have purplish spots on your skin that occasionally itch. This could be eczema or tinea. -Apply Lotrisone  cream to the affected areas twice daily for up to two weeks.  TYPE 2 DIABETES MELLITUS: Your diabetes is being monitored. -Please provide a urine sample for diabetes monitoring.  GENERAL HEALTH MAINTENANCE: We discussed and administered your flu shot. -You received your flu shot today.

## 2024-03-07 NOTE — Assessment & Plan Note (Signed)
 Eczema vs. Fungal cause. Trial of lotrisone  cream.

## 2024-03-22 ENCOUNTER — Other Ambulatory Visit: Payer: Self-pay | Admitting: Family

## 2024-03-26 ENCOUNTER — Other Ambulatory Visit: Payer: Self-pay

## 2024-03-26 ENCOUNTER — Other Ambulatory Visit (HOSPITAL_BASED_OUTPATIENT_CLINIC_OR_DEPARTMENT_OTHER): Payer: Self-pay

## 2024-03-26 ENCOUNTER — Other Ambulatory Visit: Payer: Self-pay | Admitting: Family

## 2024-03-26 MED ORDER — ACCU-CHEK SOFTCLIX LANCETS MISC
1.0000 | Freq: Four times a day (QID) | 12 refills | Status: AC
  Filled 2024-03-26: qty 200, 50d supply, fill #0
  Filled 2024-07-10: qty 200, 50d supply, fill #1

## 2024-03-27 ENCOUNTER — Telehealth: Payer: Self-pay

## 2024-03-27 NOTE — Progress Notes (Signed)
 Pharmacy Quality Measure Review  This patient is appearing on a report for being at risk of failing the adherence measure for hypertension (ACEi/ARB) medications this calendar year.   Medication: losartan  50 mg daily Last fill date: 03/20/2024 for 90 day supply  Insurance report was not up to date. No action needed at this time.   Woodie Jock, PharmD PGY1 Pharmacy Resident  03/27/2024

## 2024-04-11 ENCOUNTER — Telehealth: Payer: Self-pay

## 2024-04-11 NOTE — Progress Notes (Signed)
 Pharmacy Quality Measure Review  This patient is appearing on a report for being at risk of failing the adherence measure for diabetes medications this calendar year.   Medication: metformin  1000 mg Last fill date: 03/23/24 for 90 day supply  Insurance report was not up to date. No action needed at this time.   Jenkins Graces, PharmD PGY1 Pharmacy Resident [phone]

## 2024-04-23 ENCOUNTER — Encounter: Payer: Self-pay | Admitting: Radiology

## 2024-04-29 ENCOUNTER — Other Ambulatory Visit: Payer: Self-pay | Admitting: Family

## 2024-05-21 ENCOUNTER — Other Ambulatory Visit: Payer: Self-pay | Admitting: Family

## 2024-05-21 DIAGNOSIS — K219 Gastro-esophageal reflux disease without esophagitis: Secondary | ICD-10-CM

## 2024-05-24 ENCOUNTER — Telehealth: Payer: Self-pay

## 2024-05-24 ENCOUNTER — Other Ambulatory Visit: Payer: Self-pay | Admitting: Family

## 2024-05-24 ENCOUNTER — Other Ambulatory Visit: Payer: Self-pay

## 2024-05-24 MED ORDER — METFORMIN HCL 1000 MG PO TABS: 1000.0000 mg | ORAL_TABLET | Freq: Two times a day (BID) | ORAL | 3 refills | Status: AC

## 2024-05-24 NOTE — Telephone Encounter (Signed)
 Did you try calling Pt? Nothing documented.

## 2024-05-24 NOTE — Telephone Encounter (Signed)
 Copied from CRM #[id number]. Topic: General - Call Back - No Documentation >> May 24, 2024  2:18 PM Franky GRADE wrote: Reason for CRM: Patient is returning a call she received; however, no documentation. Patient believes it may have been Tammy Eckard.

## 2024-05-25 ENCOUNTER — Telehealth: Payer: Self-pay | Admitting: Family

## 2024-05-25 NOTE — Telephone Encounter (Signed)
 Copied from CRM #[id number]. Topic: Medicare AWV >> May 25, 2024 11:36 AM Nathanel DEL wrote: Called LVM 05/25/2024 to sched AWV. Please schedule in office or virtual visit.   Nathanel Paschal; Care Guide Ambulatory Clinical Support [place] [facility] Direct Dial: [phone]

## 2024-05-25 NOTE — Telephone Encounter (Signed)
 Called but no answer lvm for patient to know call was to verify pharmacy for metformin  prescription. Rx was sent to mail order

## 2024-05-28 ENCOUNTER — Telehealth: Payer: Self-pay | Admitting: Family

## 2024-05-28 NOTE — Telephone Encounter (Signed)
 Copied from CRM #[id number]. Topic: Medicare AWV >> May 28, 2024 10:11 AM Nathanel DEL wrote: Called LVM 05/28/2024 to sched AWV. Please schedule in office or virtual visit.   Nathanel Paschal; Care Guide Ambulatory Clinical Support [place] [facility] Direct Dial: [phone]

## 2024-05-29 ENCOUNTER — Other Ambulatory Visit

## 2024-05-31 ENCOUNTER — Other Ambulatory Visit: Payer: Self-pay | Admitting: Family

## 2024-06-18 ENCOUNTER — Telehealth: Payer: Self-pay

## 2024-06-18 NOTE — Telephone Encounter (Signed)
 Application has been faxed to [employer]. Due to the holidays it might take longer than usual for patient assistance program to process. LM on patient's VM with my CB# is she has questions [phone].  Will follow up on app 06/29/2024.

## 2024-06-18 NOTE — Telephone Encounter (Signed)
 Nicole Douglas called back. She has enough Januvia  to last until the end of January or beginning of February. That should be enough time for [employer] patient assistance program to process her application and mail out next shipment.

## 2024-06-18 NOTE — Telephone Encounter (Signed)
 Copied from CRM #[id number]. Topic: Clinical - Medication Question >> Jun 18, 2024 12:33 PM Harlene ORN wrote: Reason for CRM: Patient called. She brought in a paper form requesting a refill of Januvia  that needed to be completed by her PCP. Please call back the patient if any questions.

## 2024-06-20 ENCOUNTER — Telehealth: Payer: Self-pay

## 2024-06-20 NOTE — Telephone Encounter (Signed)
 PAP: Patient assistance application for Januvia through [employer] has been mailed to pt's home address on file. Provider portion of application will be faxed to provider's office.

## 2024-06-29 ENCOUNTER — Other Ambulatory Visit: Payer: Self-pay | Admitting: Pharmacist

## 2024-06-29 DIAGNOSIS — E119 Type 2 diabetes mellitus without complications: Secondary | ICD-10-CM

## 2024-06-29 NOTE — Progress Notes (Unsigned)
 "  06/29/2024 Name: Nicole Douglas MRN: [id number] DOB: [birth year]  Chief Complaint  Patient presents with   Diabetes   Medication Management    Nicole Douglas is a 79 y.o. year old female who presented for a telephone visit.   They were referred to the pharmacist by their PCP for assistance in managing medication access and complex medication management.    Subjective:  Care Team: Primary Care Provider: Daryl Setter, NP ; Next Scheduled Visit: not currently scheduled - last visit was 02/2025  Medication Access/Adherence  Current Pharmacy:  [facility] [place] [facility] [address] [place] [address] Phone: [phone] Fax: [phone]  [facility]) - [place], [place] - [address] [facility] [address] [place] [address] Phone: [phone] Fax: [phone]  [facility] - [place],  - [address] [place]  [address] Phone: [phone] Fax: [phone]  [id number] [place], [place] - [address] AT [facility] [address] [place] [address] Phone: [phone] Fax: [phone]   Patient reports affordability concerns with their medications: Yes  - cost of Januvia  is high but she has received from [employer] patient assistance program in past.  Patient reports access/transportation concerns to their pharmacy: No  Patient reports adherence concerns with their medications:  No      Diabetes:  Current medications: Januvia  100mg  daily and metformin  1000mg  twice a day   Current glucose readings: patient report blood glucose usually in the 120's    Patient denies hypoglycemic s/sx including no dizziness, shakiness, sweating. Patient denies hyperglycemic symptoms including no polyuria, polydipsia, polyphagia, nocturia, neuropathy, blurred vision.   Current medication access support:  [employer] patient assistance program for Januvia   Macrovascular and Microvascular Risk Reduction:  Statin? yes (atorvastatin  10mg  daily ); ACEi/ARB? yes (losartan  50mg  twice a day) Last urinary albumin/creatinine ratio:  Lab Results  Component Value Date   MICRALBCREAT 10.3 03/07/2024   Last eye exam:  Lab Results  Component Value Date   HMDIABEYEEXA No Retinopathy 10/18/2023   Last foot exam: 06/22/2022 Tobacco Use:  Tobacco Use: Low Risk (04/26/2024)   Received from [facility]   Patient History    Smoking Tobacco Use: Never    Smokeless Tobacco Use: Never    Passive Exposure: Not on file     Objective:  BP Readings from Last 3 Encounters:  03/07/24 (!) 151/67  09/20/23 123/63  06/14/23 133/65     Lab Results  Component Value Date   HGBA1C 6.5 09/20/2023    Lab Results  Component Value Date   CREATININE 0.90 09/20/2023   BUN 18 09/20/2023   NA 142 09/20/2023   K 4.5 09/20/2023   CL 103 09/20/2023   CO2 29 09/20/2023    Lab Results  Component Value Date   CHOL 196 09/20/2023   HDL 63.90 09/20/2023   LDLCALC 108 (H) 09/20/2023   TRIG 120.0 09/20/2023   CHOLHDL 3 09/20/2023    Medications Reviewed Today     Reviewed by Carla Milling, RPH-CPP (Pharmacist) on 06/29/24 at 1526  Med List Status: <None>   Medication Order Taking? Sig Documenting Provider Last Dose Status Informant  Accu-Chek Softclix Lancets lancets [id number] Yes Use as directed to test up to 4 times daily Daryl Setter, NP  Active   amLODipine  (NORVASC ) 10 MG tablet [id number] Yes Take 1  tablet (10 mg total) by mouth daily. Needs appt O'Sullivan, Melissa, NP  Active   aspirin 81 MG tablet [id number] Yes Take 81 mg by mouth daily. [provider]  Active   atorvastatin  (LIPITOR) 10 MG tablet [id number] Yes Take 1 tablet (10 mg total) by mouth daily. Due for appt Daryl Setter, NP  Active   blood glucose meter kit and supplies KIT [id number] Yes Dispense based on patient and  insurance preference. Use up to four times daily as directed. Daryl Setter, NP  Active   Blood Glucose Monitoring Suppl (ACCU-CHEK GUIDE) w/Device KIT [id number] Yes Use as directed up to 4 times daily Daryl Setter, NP  Active   Calcium  Carbonate (CALTRATE 600 PO) [id number]  Take by mouth daily. [provider]  Active   carvedilol  (COREG ) 12.5 MG tablet [id number] Yes Take 1 tablet (12.5 mg total) by mouth 2 (two) times daily with a meal. Due for appt Daryl Setter, NP  Active   clotrimazole -betamethasone  (LOTRISONE ) cream [id number]  Apply 1 Application topically daily. Daryl Setter, NP  Active   estradiol  (ESTRACE ) 0.1 MG/GM vaginal cream [id number]  Place 1 g vaginally 2 (two) times a week. Daryl Setter, NP  Active   glucose blood test strip [id number]  Use as directed to test blood sugar up to 4 times daily. Daryl Setter, NP  Active   levothyroxine  (SYNTHROID ) 50 MCG tablet [id number] Yes TAKE 1 TABLET BY MOUTH ONCE  DAILY EXCEPT TAKE ONE-HALF  TABLET BY MOUTH ON JOSEFINE Daryl Setter, NP  Active   losartan  (COZAAR ) 50 MG tablet [id number] Yes Take 1 tablet (50 mg total) by mouth in the morning and at bedtime. Due for appt Daryl Setter, NP  Active   meclizine  (ANTIVERT ) 25 MG tablet [id number]  TAKE 1 TABLET BY MOUTH 3 TIMES  DAILY AS NEEDED FOR DIZZINESS O'Sullivan, Melissa, NP  Active   metFORMIN  (GLUCOPHAGE ) 1000 MG tablet [id number] Yes Take 1 tablet (1,000 mg total) by mouth 2 (two) times daily with a meal. Daryl Setter, NP  Active   omeprazole  (PRILOSEC) 40 MG capsule [id number] Yes Take 1 capsule (40 mg total) by mouth daily. Due for appt Daryl Setter, NP  Active   sitaGLIPtin  (JANUVIA ) 100 MG tablet [id number] Yes Take 1 tablet (100 mg total) by mouth daily. Domenica Harlene LABOR, MD  Active            Med Note [facility] [place] [facility]   Fri Jun 29, 2024  3:24 PM)                Assessment/Plan:   Diabetes: - Currently  uncontrolled; goal A1c <7%. Cardiorenal risk reduction is optimized.. Blood pressure is not at goal <130/80. LDL is not at goal.  - Reviewed long term cardiovascular and renal outcomes of uncontrolled blood sugar. and Reviewed goal A1c, goal fasting, and goal 2 hour post prandial glucose. Recommended to check glucose daily - Recommend to continue Januvia  and metformin  1000mg  twice . - Called [employer] patient assistance program. Patient has been approved for patient assistance program thru 06/15/2024 - patient endorses she received her first delivery 06/25/2024  - Patient is due to follow up with PCP. She states she will check with her family schedule and call back to make appointment.   Follow Up Plan: 3 months  Madelin Ray, PharmD Clinical Pharmacist [facility]  Population Health [phone]    "

## 2024-06-29 NOTE — Telephone Encounter (Signed)
 Patient returned application to office and was faxed 06/18/2024. Called [employer] today, she was approved thru 06/20/2025. She received first delivery 06/25/2024.

## 2024-07-04 ENCOUNTER — Telehealth: Payer: Self-pay | Admitting: Family

## 2024-07-04 NOTE — Telephone Encounter (Signed)
 Please contact pt to arrange a follow up visit with me.

## 2024-07-04 NOTE — Progress Notes (Signed)
I have personally reviewed this encounter including the documentation in this note and have collaborated with the care management provider regarding care management and care coordination activities to include development and update of the comprehensive care plan. I am certifying that I agree with the content of this note and encounter as supervising provider.  Melissa O'Sullivan NP  

## 2024-07-04 NOTE — Telephone Encounter (Signed)
 Lvm to sched

## 2024-07-10 ENCOUNTER — Other Ambulatory Visit (HOSPITAL_BASED_OUTPATIENT_CLINIC_OR_DEPARTMENT_OTHER): Payer: Self-pay

## 2024-07-12 ENCOUNTER — Other Ambulatory Visit (HOSPITAL_BASED_OUTPATIENT_CLINIC_OR_DEPARTMENT_OTHER): Payer: Self-pay

## 2024-09-25 ENCOUNTER — Other Ambulatory Visit: Admitting: Pharmacist
